# Patient Record
Sex: Female | Born: 1940 | Race: Black or African American | Hispanic: No | Smoking: Never smoker
Health system: Southern US, Community
[De-identification: ages and names within clinical notes are randomized; demographics above are authoritative.]

## PROBLEM LIST (undated history)

## (undated) DIAGNOSIS — E079 Disorder of thyroid, unspecified: Secondary | ICD-10-CM

## (undated) DIAGNOSIS — K219 Gastro-esophageal reflux disease without esophagitis: Secondary | ICD-10-CM

## (undated) DIAGNOSIS — E785 Hyperlipidemia, unspecified: Secondary | ICD-10-CM

## (undated) DIAGNOSIS — I639 Cerebral infarction, unspecified: Secondary | ICD-10-CM

## (undated) HISTORY — PX: CHOLECYSTECTOMY: SHX55

## (undated) HISTORY — DX: Cerebral infarction, unspecified: I63.9

## (undated) HISTORY — PX: ABDOMINAL HYSTERECTOMY: SHX81

## (undated) HISTORY — PX: LUMBAR LAMINECTOMY/DECOMPRESSION MICRODISCECTOMY: SHX5026

## (undated) HISTORY — PX: REPLACEMENT TOTAL KNEE: SUR1224

---

## 2008-04-11 DIAGNOSIS — M19079 Primary osteoarthritis, unspecified ankle and foot: Secondary | ICD-10-CM | POA: Insufficient documentation

## 2013-08-08 ENCOUNTER — Other Ambulatory Visit: Payer: Self-pay | Admitting: Internal Medicine

## 2013-08-08 DIAGNOSIS — M1990 Unspecified osteoarthritis, unspecified site: Secondary | ICD-10-CM

## 2013-08-19 ENCOUNTER — Ambulatory Visit
Admission: RE | Admit: 2013-08-19 | Discharge: 2013-08-19 | Disposition: A | Discharge status: Home / Self Care | Payer: No Typology Code available for payment source | Source: Ambulatory Visit | Attending: Unknown Physician Specialty | Admitting: Unknown Physician Specialty

## 2013-08-19 DIAGNOSIS — M1990 Unspecified osteoarthritis, unspecified site: Secondary | ICD-10-CM

## 2013-12-08 ENCOUNTER — Other Ambulatory Visit: Payer: Self-pay | Admitting: Internal Medicine

## 2013-12-08 DIAGNOSIS — M170 Bilateral primary osteoarthritis of knee: Secondary | ICD-10-CM

## 2013-12-20 ENCOUNTER — Other Ambulatory Visit: Payer: Self-pay

## 2013-12-21 ENCOUNTER — Ambulatory Visit
Admission: RE | Admit: 2013-12-21 | Discharge: 2013-12-21 | Disposition: A | Discharge status: Home / Self Care | Payer: No Typology Code available for payment source | Source: Ambulatory Visit | Attending: Unknown Physician Specialty | Admitting: Unknown Physician Specialty

## 2013-12-21 DIAGNOSIS — M170 Bilateral primary osteoarthritis of knee: Secondary | ICD-10-CM

## 2014-04-19 ENCOUNTER — Other Ambulatory Visit: Payer: Self-pay | Admitting: Internal Medicine

## 2014-04-19 ENCOUNTER — Other Ambulatory Visit: Payer: Self-pay | Admitting: Unknown Physician Specialty

## 2014-04-19 DIAGNOSIS — M170 Bilateral primary osteoarthritis of knee: Secondary | ICD-10-CM

## 2014-04-20 ENCOUNTER — Ambulatory Visit
Admission: RE | Admit: 2014-04-20 | Discharge: 2014-04-20 | Disposition: A | Discharge status: Home / Self Care | Payer: No Typology Code available for payment source | Source: Ambulatory Visit | Attending: Unknown Physician Specialty | Admitting: Unknown Physician Specialty

## 2014-04-20 DIAGNOSIS — M170 Bilateral primary osteoarthritis of knee: Secondary | ICD-10-CM

## 2014-07-06 DIAGNOSIS — M11262 Other chondrocalcinosis, left knee: Secondary | ICD-10-CM | POA: Insufficient documentation

## 2014-10-20 DIAGNOSIS — M1120 Other chondrocalcinosis, unspecified site: Secondary | ICD-10-CM | POA: Insufficient documentation

## 2015-04-04 DIAGNOSIS — M2011 Hallux valgus (acquired), right foot: Secondary | ICD-10-CM | POA: Insufficient documentation

## 2015-04-04 DIAGNOSIS — E039 Hypothyroidism, unspecified: Secondary | ICD-10-CM | POA: Insufficient documentation

## 2015-04-04 DIAGNOSIS — M21611 Bunion of right foot: Secondary | ICD-10-CM | POA: Insufficient documentation

## 2015-05-15 DIAGNOSIS — H903 Sensorineural hearing loss, bilateral: Secondary | ICD-10-CM | POA: Insufficient documentation

## 2016-08-02 ENCOUNTER — Emergency Department (HOSPITAL_BASED_OUTPATIENT_CLINIC_OR_DEPARTMENT_OTHER)
Admission: EM | Admit: 2016-08-02 | Discharge: 2016-08-02 | Disposition: A | Discharge status: Home / Self Care | Payer: Medicare Other | Attending: Emergency Medicine | Admitting: Emergency Medicine

## 2016-08-02 ENCOUNTER — Encounter (HOSPITAL_BASED_OUTPATIENT_CLINIC_OR_DEPARTMENT_OTHER): Payer: Self-pay

## 2016-08-02 DIAGNOSIS — Z91040 Latex allergy status: Secondary | ICD-10-CM | POA: Insufficient documentation

## 2016-08-02 DIAGNOSIS — G8918 Other acute postprocedural pain: Secondary | ICD-10-CM | POA: Diagnosis not present

## 2016-08-02 DIAGNOSIS — M545 Low back pain: Secondary | ICD-10-CM | POA: Diagnosis present

## 2016-08-02 HISTORY — DX: Gastro-esophageal reflux disease without esophagitis: K21.9

## 2016-08-02 HISTORY — DX: Disorder of thyroid, unspecified: E07.9

## 2016-08-02 HISTORY — DX: Hyperlipidemia, unspecified: E78.5

## 2016-08-02 LAB — CBC
HCT: 37.3 % (ref 36.0–46.0)
Hemoglobin: 12.4 g/dL (ref 12.0–15.0)
MCH: 28.6 pg (ref 26.0–34.0)
MCHC: 33.2 g/dL (ref 30.0–36.0)
MCV: 86.1 fL (ref 78.0–100.0)
PLATELETS: 241 10*3/uL (ref 150–400)
RBC: 4.33 MIL/uL (ref 3.87–5.11)
RDW: 13.8 % (ref 11.5–15.5)
WBC: 8.9 10*3/uL (ref 4.0–10.5)

## 2016-08-02 LAB — BASIC METABOLIC PANEL
ANION GAP: 10 (ref 5–15)
BUN: 20 mg/dL (ref 6–20)
CALCIUM: 8.8 mg/dL — AB (ref 8.9–10.3)
CO2: 22 mmol/L (ref 22–32)
CREATININE: 0.88 mg/dL (ref 0.44–1.00)
Chloride: 107 mmol/L (ref 101–111)
GFR calc non Af Amer: >60 mL/min (ref >60)
Glucose, Bld: 109 mg/dL — ABNORMAL HIGH (ref 65–99)
Potassium: 3.7 mmol/L (ref 3.5–5.1)
Sodium: 139 mmol/L (ref 135–145)

## 2016-08-02 MED ORDER — SODIUM CHLORIDE 0.9 % IV BOLUS (SEPSIS): 1000.0000 mL | Freq: Once | INTRAVENOUS | Status: DC

## 2016-08-02 MED ORDER — HYDROMORPHONE HCL 2 MG PO TABS: 2.0000 mg | ORAL_TABLET | Freq: Four times a day (QID) | ORAL | 0 refills | Status: DC | PRN

## 2016-08-02 MED ORDER — HYDROMORPHONE HCL 1 MG/ML IJ SOLN
1.0000 mg | Freq: Once | INTRAMUSCULAR | Status: AC
  Administered 2016-08-02: 1 mg via INTRAVENOUS
  Filled 2016-08-02: qty 1

## 2016-08-02 MED ORDER — ONDANSETRON HCL 4 MG/2ML IJ SOLN
4.0000 mg | Freq: Once | INTRAMUSCULAR | Status: AC
  Administered 2016-08-02: 4 mg via INTRAVENOUS
  Filled 2016-08-02: qty 2

## 2016-08-02 MED ORDER — HYDROMORPHONE HCL 1 MG/ML IJ SOLN
0.5000 mg | Freq: Once | INTRAMUSCULAR | Status: AC
  Administered 2016-08-02: 0.5 mg via INTRAVENOUS
  Filled 2016-08-02: qty 1

## 2016-08-02 NOTE — Discharge Instructions (Signed)
Follow-up with your spine surgeon.  Call them if you have any worsening symptoms.  Return to [facility] for fever, weakness other worsening concerns

## 2016-08-02 NOTE — ED Notes (Addendum)
Pt helped into gown. Able to stand and pivot to lowered bed from wheelchair x1 max assist. Daughter at bedside.

## 2016-08-02 NOTE — ED Provider Notes (Signed)
[facility] Provider [facility]   CSN: [id number] Arrival date & time: 08/02/16  1027     History   Chief Complaint Chief Complaint  Patient presents with  . Back Pain    HPI Tiffany Byrd is a 76 y.o. female.  HPI Patient presents to the emergency room for evaluation of persistent pain in her lower back and left buttocks rating down her left leg. Patient has a history of sciatica. She had a spinal surgery performed on Thursday. Patient initially felt okay but then yesterday she started having increasing severe pain. She was taking her hydrocodone aspirin and muscle relaxants without relief. She called her doctor today because she was not able to get comfortable and she was instructed to come to a emergency room for evaluation.    Past Medical History:  Diagnosis Date  . GERD (gastroesophageal reflux disease)   . Hyperlipidemia   . Thyroid disease     There are no active problems to display for this patient.   Past Surgical History:  Procedure Laterality Date  . ABDOMINAL HYSTERECTOMY    . CHOLECYSTECTOMY    . LUMBAR LAMINECTOMY/DECOMPRESSION MICRODISCECTOMY    . REPLACEMENT TOTAL KNEE      OB History    No data available       Home Medications    Prior to Admission medications   Medication Sig Start Date End Date Taking? Authorizing Provider  cholecalciferol (VITAMIN D) 400 units TABS tablet Take 400 Units by mouth.   Yes [provider]  Cyclobenzaprine HCl (FLEXERIL PO) Take by mouth.   Yes [provider]  HYDROCODONE-ACETAMINOPHEN PO Take by mouth.   Yes [provider]  levothyroxine (SYNTHROID, LEVOTHROID) 50 MCG tablet Take 50 mcg by mouth daily before breakfast.   Yes [provider]  metoCLOPramide (REGLAN) 5 MG tablet Take 5 mg by mouth 4 (four) times daily.   Yes [provider]  Multiple Vitamin (MULTIVITAMIN) LIQD Take 5 mLs by mouth daily.   Yes [provider]  Omeprazole (PRILOSEC PO)  Take by mouth.   Yes [provider]  Ondansetron HCl (ZOFRAN PO) Take by mouth.   Yes [provider]  PRAVASTATIN SODIUM PO Take by mouth.   Yes [provider]  triamterene-hydrochlorothiazide (DYAZIDE) 37.5-25 MG capsule Take 1 capsule by mouth daily.   Yes [provider]  HYDROmorphone (DILAUDID) 2 MG tablet Take 1 tablet (2 mg total) by mouth every 6 (six) hours as needed. 08/02/16   Linwood DibblesKnapp, Nicolo Tomko, MD    Family History History reviewed. No pertinent family history.  Social History Social History  Substance Use Topics  . Smoking status: Never Smoker  . Smokeless tobacco: Never Used  . Alcohol use Not on file     Allergies   Latex and Penicillins   Review of Systems Review of Systems  All other systems reviewed and are negative.    Physical Exam Updated Vital Signs BP 129/80   Pulse 89   Temp 98.1 F (36.7 C) (Oral)   Resp 18   Ht 1.549 m (5\' 1" )   Wt 73 kg (161 lb)   SpO2 94%   BMI 30.42 kg/m   Physical Exam  Constitutional: She appears well-developed and well-nourished. No distress.  HENT:  Head: Normocephalic and atraumatic.  Right Ear: External ear normal.  Left Ear: External ear normal.  Eyes: Conjunctivae are normal. Right eye exhibits no discharge. Left eye exhibits no discharge. No scleral icterus.  Neck: Neck supple.  No tracheal deviation present.  Cardiovascular: Normal rate, regular rhythm and intact distal pulses.   Pulmonary/Chest: Effort normal and breath sounds normal. No stridor. No respiratory distress. She has no wheezes. She has no rales.  Abdominal: Soft. Bowel sounds are normal. She exhibits no distension. There is no tenderness. There is no rebound and no guarding.  Musculoskeletal: She exhibits no edema.       Lumbar back: She exhibits no swelling.  Surgical wound without erythema or drainage, no induration or swelling  Neurological: She is alert. She has normal strength. No cranial nerve deficit (no  facial droop, extraocular movements intact, no slurred speech) or sensory deficit. She exhibits normal muscle tone. She displays no seizure activity. Coordination normal.  Skin: Skin is warm and dry. No rash noted.  Psychiatric: She has a normal mood and affect.  Nursing note and vitals reviewed.    ED Treatments / Results  Labs (all labs ordered are listed, but only abnormal results are displayed) Labs Reviewed  BASIC METABOLIC PANEL - Abnormal; Notable for the following:       Result Value   Glucose, Bld 109 (*)    Calcium 8.8 (*)    All other components within normal limits  CBC    Procedures (including critical care time)  Medications Ordered in ED Medications  sodium chloride 0.9 % bolus 1,000 mL (not administered)  HYDROmorphone (DILAUDID) injection 1 mg (1 mg Intravenous Given 08/02/16 1128)  ondansetron (ZOFRAN) injection 4 mg (4 mg Intravenous Given 08/02/16 1128)  HYDROmorphone (DILAUDID) injection 0.5 mg (0.5 mg Intravenous Given 08/02/16 1214)     Initial Impression / Assessment and Plan / ED Course  I have reviewed the triage vital signs and the nursing notes.  Pertinent labs & imaging results that were available during my care of the patient were reviewed by me and considered in my medical decision making (see chart for details).  Clinical Course as of Aug 02 1253  Sat Aug 02, 2016  1153 Pain is improving.  Will give an additional dose  [JK]  1251 Pt feels like her pain has improved.  Feels ready for discharge  [JK]    Clinical Course User Index [JK] Linwood Dibbles, MD    Patient presents with back pain associated with recent spinal surgery. She has no signs of any fever to suggest acute infection. No acute neurologic deficits. Plan on discharge home with continued pain management. We discussed following up with her neurosurgeon or going to [facility] if she has any recurrent or worsening symptoms since that is where she had her surgery.  Final Clinical  Impressions(s) / ED Diagnoses   Final diagnoses:  Post-operative pain    New Prescriptions New Prescriptions   HYDROMORPHONE (DILAUDID) 2 MG TABLET    Take 1 tablet (2 mg total) by mouth every 6 (six) hours as needed.     Linwood Dibbles, MD 08/02/16 1256

## 2016-08-02 NOTE — ED Triage Notes (Addendum)
Pt reports lumbar surgery Thursday - continues to have left buttocks pain - despite taking hydrocodone, ASA and muscle relaxants. Pain radiates down left left. Denies injury or falls. Denies bowel incontinence but has baseline bladder incontinence.

## 2016-11-19 DIAGNOSIS — Z9889 Other specified postprocedural states: Secondary | ICD-10-CM | POA: Insufficient documentation

## 2017-11-20 ENCOUNTER — Encounter (HOSPITAL_BASED_OUTPATIENT_CLINIC_OR_DEPARTMENT_OTHER): Payer: Self-pay | Admitting: Emergency Medicine

## 2017-11-20 ENCOUNTER — Emergency Department (HOSPITAL_BASED_OUTPATIENT_CLINIC_OR_DEPARTMENT_OTHER): Payer: Medicare Other

## 2017-11-20 ENCOUNTER — Other Ambulatory Visit: Payer: Self-pay

## 2017-11-20 ENCOUNTER — Emergency Department (HOSPITAL_BASED_OUTPATIENT_CLINIC_OR_DEPARTMENT_OTHER)
Admission: EM | Admit: 2017-11-20 | Discharge: 2017-11-20 | Disposition: A | Discharge status: Home / Self Care | Payer: Medicare Other | Attending: Emergency Medicine | Admitting: Emergency Medicine

## 2017-11-20 DIAGNOSIS — Y999 Unspecified external cause status: Secondary | ICD-10-CM | POA: Insufficient documentation

## 2017-11-20 DIAGNOSIS — Z23 Encounter for immunization: Secondary | ICD-10-CM | POA: Diagnosis not present

## 2017-11-20 DIAGNOSIS — S0990XA Unspecified injury of head, initial encounter: Secondary | ICD-10-CM | POA: Diagnosis present

## 2017-11-20 DIAGNOSIS — Z96659 Presence of unspecified artificial knee joint: Secondary | ICD-10-CM | POA: Insufficient documentation

## 2017-11-20 DIAGNOSIS — Z79899 Other long term (current) drug therapy: Secondary | ICD-10-CM | POA: Insufficient documentation

## 2017-11-20 DIAGNOSIS — Y92018 Other place in single-family (private) house as the place of occurrence of the external cause: Secondary | ICD-10-CM | POA: Diagnosis not present

## 2017-11-20 DIAGNOSIS — Y9301 Activity, walking, marching and hiking: Secondary | ICD-10-CM | POA: Insufficient documentation

## 2017-11-20 DIAGNOSIS — S0101XA Laceration without foreign body of scalp, initial encounter: Secondary | ICD-10-CM | POA: Diagnosis not present

## 2017-11-20 DIAGNOSIS — W01190A Fall on same level from slipping, tripping and stumbling with subsequent striking against furniture, initial encounter: Secondary | ICD-10-CM | POA: Diagnosis not present

## 2017-11-20 MED ORDER — TETANUS-DIPHTH-ACELL PERTUSSIS 5-2.5-18.5 LF-MCG/0.5 IM SUSP
0.5000 mL | Freq: Once | INTRAMUSCULAR | Status: AC
  Administered 2017-11-20: 0.5 mL via INTRAMUSCULAR
  Filled 2017-11-20: qty 0.5

## 2017-11-20 MED ORDER — LIDOCAINE-EPINEPHRINE (PF) 1 %-1:200000 IJ SOLN
10.0000 mL | Freq: Once | INTRAMUSCULAR | Status: AC
  Administered 2017-11-20: 10 mL
  Filled 2017-11-20: qty 10

## 2017-11-20 NOTE — ED Triage Notes (Signed)
Pt tripped over her shoe, fell and hit the back of her head.  No LOC.  Denies blood thinners.

## 2017-11-20 NOTE — ED Provider Notes (Signed)
[facility] EMERGENCY DEPARTMENT Provider Note   CSN: [id number] Arrival date & time: 11/20/17  9811     History   Chief Complaint Chief Complaint  Patient presents with  . Head Injury    HPI Traniya Prichett is a 77 y.o. female.  Patient is a 77 year old female with a history of GERD, hyperlipidemia and thyroid disease presenting today after having a mechanical fall around 3:00 in the morning when she got up to go to the bathroom.  She tripped on her house shoe causing her to fall backwards in the back of her head hit the corner of an end table.  She then slid to the floor.  She denies loss of consciousness but did cut the back of her head.  When she did her husband were looking this morning they realized the cut was more extensive than they thought and came for further evaluation.  She does not take anticoagulation.  She is otherwise feeling normal.  The history is provided by the patient.  Head Injury   The incident occurred 3 to 5 hours ago. She came to the ER via walk-in. The injury mechanism was a fall. There was no loss of consciousness. The volume of blood lost was minimal. The quality of the pain is described as dull. The pain is at a severity of 1/10. The pain is mild. The pain has been constant since the injury. Pertinent negatives include no numbness, no blurred vision, no vomiting, no tinnitus, no disorientation and no weakness. Associated symptoms comments: No neck pain.  No difficulty walking.  Unknown last tetanus. She has tried nothing for the symptoms. The treatment provided no relief.    Past Medical History:  Diagnosis Date  . GERD (gastroesophageal reflux disease)   . Hyperlipidemia   . Thyroid disease     There are no active problems to display for this patient.   Past Surgical History:  Procedure Laterality Date  . ABDOMINAL HYSTERECTOMY    . CHOLECYSTECTOMY    . LUMBAR LAMINECTOMY/DECOMPRESSION MICRODISCECTOMY    . REPLACEMENT TOTAL KNEE        OB History   None      Home Medications    Prior to Admission medications   Medication Sig Start Date End Date Taking? Authorizing Provider  cholecalciferol (VITAMIN D) 400 units TABS tablet Take 400 Units by mouth.    [provider]  Cyclobenzaprine HCl (FLEXERIL PO) Take by mouth.    [provider]  levothyroxine (SYNTHROID, LEVOTHROID) 50 MCG tablet Take 50 mcg by mouth daily before breakfast.    [provider]  metoCLOPramide (REGLAN) 5 MG tablet Take 5 mg by mouth 4 (four) times daily.    [provider]  Multiple Vitamin (MULTIVITAMIN) LIQD Take 5 mLs by mouth daily.    [provider]  Omeprazole (PRILOSEC PO) Take by mouth.    [provider]  Ondansetron HCl (ZOFRAN PO) Take by mouth.    [provider]  PRAVASTATIN SODIUM PO Take by mouth.    [provider]  triamterene-hydrochlorothiazide (DYAZIDE) 37.5-25 MG capsule Take 1 capsule by mouth daily.    [provider]    Family History No family history on file.  Social History Social History   Tobacco Use  . Smoking status: Never Smoker  . Smokeless tobacco: Never Used  Substance Use Topics  . Alcohol use: Not on file  . Drug use: Not on file     Allergies   Latex;  Penicillins; and Hydrocodone-acetaminophen   Review of Systems Review of Systems  HENT: Negative for tinnitus.   Eyes: Negative for blurred vision.  Gastrointestinal: Negative for vomiting.  Neurological: Negative for weakness and numbness.  All other systems reviewed and are negative.    Physical Exam Updated Vital Signs BP (!) 143/82   Pulse 77   Temp 98.3 F (36.8 C) (Oral)   Ht 5' (1.524 m)   Wt 72.6 kg   SpO2 99%   BMI 31.25 kg/m   Physical Exam  Constitutional: She is oriented to person, place, and time. She appears well-developed and well-nourished. No distress.  HENT:  Head: Normocephalic. Head is with laceration.    Mouth/Throat:  Oropharynx is clear and moist.  Eyes: Pupils are equal, round, and reactive to light. Conjunctivae and EOM are normal.  Neck: Normal range of motion. Neck supple. No spinous process tenderness and no muscular tenderness present. Normal range of motion present.    Cardiovascular: Normal rate, regular rhythm and intact distal pulses.  No murmur heard. Pulmonary/Chest: Effort normal and breath sounds normal. No respiratory distress. She has no wheezes. She has no rales.  Musculoskeletal: Normal range of motion. She exhibits no edema or tenderness.  Neurological: She is alert and oriented to person, place, and time. She has normal strength. No sensory deficit. Coordination and gait normal.  Skin: Skin is warm and dry. No rash noted. No erythema.  Psychiatric: She has a normal mood and affect. Her behavior is normal.  Nursing note and vitals reviewed.    ED Treatments / Results  Labs (all labs ordered are listed, but only abnormal results are displayed) Labs Reviewed - No data to display  EKG None  Radiology Ct Head Wo Contrast  Result Date: 11/20/2017 CLINICAL DATA:  Pain following fall EXAM: CT HEAD WITHOUT CONTRAST TECHNIQUE: Contiguous axial images were obtained from the base of the skull through the vertex without intravenous contrast. COMPARISON:  None. FINDINGS: Brain: There is mild to moderate diffuse atrophy. There is no intracranial mass, hemorrhage, extra-axial fluid collection, or midline shift. There is mild patchy small vessel disease in the centra semiovale bilaterally. Brain parenchyma elsewhere appears normal. No evident acute infarct. Vascular: No hyperdense vessel. There is calcification in each carotid siphon region. Skull: Bony calvarium appears intact. Sinuses/Orbits: There is mucosal thickening in several ethmoid air cells bilaterally. Other visualized paranasal sinuses are clear. Orbits appear symmetric bilaterally. There is evidence of cataract removals bilaterally.  Other: Bony calvarium pairs intact. IMPRESSION: Atrophy with mild patchy periventricular small vessel disease. No evident acute infarct. No mass or hemorrhage. There are foci of carotid artery calcification. There is mucosal thickening in several ethmoid air cells. Electronically Signed   By: Bretta Bang III M.D.   On: 11/20/2017 10:38    Procedures Procedures (including critical care time) LACERATION REPAIR Performed by: [facility] Authorized by: Gwyneth Sprout Consent: Verbal consent obtained. Risks and benefits: risks, benefits and alternatives were discussed Consent given by: patient Patient identity confirmed: provided demographic data Prepped and Draped in normal sterile fashion Wound explored  Laceration Location: scalp  Laceration Length: 3cm  No Foreign Bodies seen or palpated  Anesthesia: local infiltration  Local anesthetic: lidocaine 1% with epinephrine  Anesthetic total: 4 ml  Irrigation method: syringe Amount of cleaning: standard  Skin closure: staples  Number of sutures: 6   Patient tolerance: Patient tolerated the procedure well with no immediate complications.   Medications Ordered in ED Medications  Tdap (BOOSTRIX) injection 0.5  mL (has no administration in time range)     Initial Impression / Assessment and Plan / ED Course  I have reviewed the triage vital signs and the nursing notes.  Pertinent labs & imaging results that were available during my care of the patient were reviewed by me and considered in my medical decision making (see chart for details).     Patient with a mechanical fall early this morning with results of laceration to the back of her head.  She is neurovascularly intact at this time.  She had no LOC and does not take anticoagulation.  Tetanus shot was updated.  Wound repair as above.  Head CT neg.  Final Clinical Impressions(s) / ED Diagnoses   Final diagnoses:  Laceration of scalp, initial encounter     ED Discharge Orders    None       Gwyneth Sprout, MD 11/20/17 1120

## 2017-11-27 ENCOUNTER — Encounter (HOSPITAL_BASED_OUTPATIENT_CLINIC_OR_DEPARTMENT_OTHER): Payer: Self-pay | Admitting: Emergency Medicine

## 2017-11-27 ENCOUNTER — Emergency Department (HOSPITAL_BASED_OUTPATIENT_CLINIC_OR_DEPARTMENT_OTHER)
Admission: EM | Admit: 2017-11-27 | Discharge: 2017-11-27 | Disposition: A | Discharge status: Home / Self Care | Payer: Medicare Other | Attending: Emergency Medicine | Admitting: Emergency Medicine

## 2017-11-27 ENCOUNTER — Other Ambulatory Visit: Payer: Self-pay

## 2017-11-27 DIAGNOSIS — Z7902 Long term (current) use of antithrombotics/antiplatelets: Secondary | ICD-10-CM | POA: Insufficient documentation

## 2017-11-27 DIAGNOSIS — Z4802 Encounter for removal of sutures: Secondary | ICD-10-CM

## 2017-11-27 DIAGNOSIS — E079 Disorder of thyroid, unspecified: Secondary | ICD-10-CM | POA: Insufficient documentation

## 2017-11-27 DIAGNOSIS — X58XXXD Exposure to other specified factors, subsequent encounter: Secondary | ICD-10-CM | POA: Insufficient documentation

## 2017-11-27 DIAGNOSIS — Z91040 Latex allergy status: Secondary | ICD-10-CM | POA: Diagnosis not present

## 2017-11-27 DIAGNOSIS — S0181XD Laceration without foreign body of other part of head, subsequent encounter: Secondary | ICD-10-CM | POA: Diagnosis not present

## 2017-11-27 DIAGNOSIS — Z79899 Other long term (current) drug therapy: Secondary | ICD-10-CM | POA: Insufficient documentation

## 2017-11-27 DIAGNOSIS — Z96659 Presence of unspecified artificial knee joint: Secondary | ICD-10-CM | POA: Diagnosis not present

## 2017-11-27 NOTE — ED Provider Notes (Signed)
[facility] EMERGENCY DEPARTMENT Provider Note   CSN: [id number] Arrival date & time: 11/27/17  0802     History   Chief Complaint Chief Complaint  Patient presents with  . Suture / Staple Removal    HPI Tiffany Byrd is a 77 y.o. female.  HPI   Presents for staple removal. Injury 7 days ago. No concerns today.  Past Medical History:  Diagnosis Date  . GERD (gastroesophageal reflux disease)   . Hyperlipidemia   . Thyroid disease     There are no active problems to display for this patient.   Past Surgical History:  Procedure Laterality Date  . ABDOMINAL HYSTERECTOMY    . CHOLECYSTECTOMY    . LUMBAR LAMINECTOMY/DECOMPRESSION MICRODISCECTOMY    . REPLACEMENT TOTAL KNEE       OB History   None      Home Medications    Prior to Admission medications   Medication Sig Start Date End Date Taking? Authorizing Provider  cholecalciferol (VITAMIN D) 400 units TABS tablet Take 400 Units by mouth.    [provider]  Cyclobenzaprine HCl (FLEXERIL PO) Take by mouth.    [provider]  levothyroxine (SYNTHROID, LEVOTHROID) 50 MCG tablet Take 50 mcg by mouth daily before breakfast.    [provider]  metoCLOPramide (REGLAN) 5 MG tablet Take 5 mg by mouth 4 (four) times daily.    [provider]  Multiple Vitamin (MULTIVITAMIN) LIQD Take 5 mLs by mouth daily.    [provider]  Omeprazole (PRILOSEC PO) Take by mouth.    [provider]  Ondansetron HCl (ZOFRAN PO) Take by mouth.    [provider]  PRAVASTATIN SODIUM PO Take by mouth.    [provider]  triamterene-hydrochlorothiazide (DYAZIDE) 37.5-25 MG capsule Take 1 capsule by mouth daily.    [provider]    Family History No family history on file.  Social History Social History   Tobacco Use  . Smoking status: Never Smoker  . Smokeless tobacco: Never Used  Substance Use Topics  . Alcohol use: Not on file  .  Drug use: Not on file     Allergies   Latex; Penicillins; and Hydrocodone-acetaminophen   Review of Systems Review of Systems  Skin: Positive for wound.     Physical Exam Updated Vital Signs BP (!) 146/79 (BP Location: Right Arm)   Pulse 70   Temp 97.8 F (36.6 C)   Resp 14   SpO2 97%   Physical Exam  Constitutional: She is oriented to person, place, and time. She appears well-developed and well-nourished. No distress.  HENT:  Head: Normocephalic and atraumatic.  Eyes: Conjunctivae and EOM are normal.  Neck: Normal range of motion.  Cardiovascular: Normal rate.  Pulmonary/Chest: Effort normal and breath sounds normal. No respiratory distress.  Neurological: She is alert and oriented to person, place, and time.  Skin: Skin is warm and dry. No rash noted. She is not diaphoretic. No erythema.  Healing laceration, 6 staplesin place posterior scalp  Nursing note and vitals reviewed.    ED Treatments / Results  Labs (all labs ordered are listed, but only abnormal results are displayed) Labs Reviewed - No data to display  EKG None  Radiology No results found.  Procedures .Suture Removal Date/Time: 11/27/2017 9:40 PM Performed by: Alvira Monday, MD Authorized by: Alvira Monday, MD   Consent:    Consent obtained:  Verbal   Consent given by:  Patient   Risks discussed:  Bleeding, pain and wound separation Location:    Location:  Head/neck   Head/neck location:  Scalp Procedure details:    Wound appearance:  No signs of infection, good wound healing and clean   Number of staples removed:  6 Post-procedure details:    Patient tolerance of procedure:  Tolerated well, no immediate complications   (including critical care time)  Medications Ordered in ED Medications - No data to display   Initial Impression / Assessment and Plan / ED Course  I have reviewed the triage vital signs and the nursing notes.  Pertinent labs & imaging results that were  available during my care of the patient were reviewed by me and considered in my medical decision making (see chart for details).    Presents with desire for staple removal. Wound with appropriate healing, no sign of infection. No other concerns today. 6 staples removed as above.   Final Clinical Impressions(s) / ED Diagnoses   Final diagnoses:  Removal of staples    ED Discharge Orders    None       Alvira Monday, MD 11/27/17 2141

## 2017-11-27 NOTE — ED Triage Notes (Signed)
PT presents wanting staples removed from back of head

## 2018-03-17 DIAGNOSIS — G44219 Episodic tension-type headache, not intractable: Secondary | ICD-10-CM | POA: Insufficient documentation

## 2018-10-06 DIAGNOSIS — M7062 Trochanteric bursitis, left hip: Secondary | ICD-10-CM | POA: Insufficient documentation

## 2019-06-22 DIAGNOSIS — N289 Disorder of kidney and ureter, unspecified: Secondary | ICD-10-CM | POA: Insufficient documentation

## 2019-08-04 DIAGNOSIS — H43813 Vitreous degeneration, bilateral: Secondary | ICD-10-CM | POA: Insufficient documentation

## 2020-03-05 ENCOUNTER — Ambulatory Visit: Payer: Self-pay

## 2020-03-05 ENCOUNTER — Ambulatory Visit (HOSPITAL_BASED_OUTPATIENT_CLINIC_OR_DEPARTMENT_OTHER)
Admission: RE | Admit: 2020-03-05 | Discharge: 2020-03-05 | Disposition: A | Discharge status: Home / Self Care | Payer: Medicare Other | Source: Ambulatory Visit | Attending: Family Medicine | Admitting: Family Medicine

## 2020-03-05 ENCOUNTER — Other Ambulatory Visit: Payer: Self-pay

## 2020-03-05 ENCOUNTER — Ambulatory Visit: Payer: Medicare Other | Admitting: Family Medicine

## 2020-03-05 VITALS — BP 148/80 | Ht 61.0 in | Wt 160.0 lb

## 2020-03-05 DIAGNOSIS — M1611 Unilateral primary osteoarthritis, right hip: Secondary | ICD-10-CM | POA: Diagnosis not present

## 2020-03-05 DIAGNOSIS — M1712 Unilateral primary osteoarthritis, left knee: Secondary | ICD-10-CM | POA: Diagnosis not present

## 2020-03-05 DIAGNOSIS — Z96651 Presence of right artificial knee joint: Secondary | ICD-10-CM | POA: Insufficient documentation

## 2020-03-05 DIAGNOSIS — M25551 Pain in right hip: Secondary | ICD-10-CM

## 2020-03-05 DIAGNOSIS — G8929 Other chronic pain: Secondary | ICD-10-CM

## 2020-03-05 MED ORDER — CELECOXIB 200 MG PO CAPS: ORAL_CAPSULE | ORAL | 2 refills | Status: DC

## 2020-03-05 NOTE — Assessment & Plan Note (Signed)
She reports she was told that she needs replacement.  Has limitations in her range of motion.  Has significant weakness with hip abduction.  Has been provided to hip joint injections.  Limited improvement with the second injection. -Counseled on home exercise therapy and supportive care. -X-ray. -Referral to physical therapy. -Could consider injection.

## 2020-03-05 NOTE — Assessment & Plan Note (Signed)
Mild effusion on exam.  Has had previous steroid injections in the past.  Likely exacerbated with taking pressure off of the right leg. -Counseled on home exercise therapy and supportive care. -Celebrex. -Referral to physical therapy. -Could consider imaging or injection.

## 2020-03-05 NOTE — Patient Instructions (Signed)
Nice to meet you Please try ice as needed  Please try physical therapy  Please try the celebrex   Please send me a message in MyChart with any questions or updates.  Please see me back in 4-6 weeks.   --Dr. Jordan Likes

## 2020-03-05 NOTE — Progress Notes (Signed)
Tiffany Byrd - 80 y.o. female MRN [id number]  Date of birth: 08/17/1940  SUBJECTIVE:  Including CC & ROS.  No chief complaint on file.   Tiffany Byrd is a 80 y.o. female that is presenting with acute on chronic right knee pain and right hip pain.  She has a history of arthroplasty of the right knee.  Started having pain about a year ago.  She subsequently has developed severe hip pain.  She has been taking Tylenol with limited improvement.  She has not done physical therapy since 6 weeks after her knee replacement surgery.  Denies any falls or injuries.   Review of Systems See HPI   HISTORY: Past Medical, Surgical, Social, and Family History Reviewed & Updated per EMR.   Pertinent Historical Findings include:  Past Medical History:  Diagnosis Date  . GERD (gastroesophageal reflux disease)   . Hyperlipidemia   . Thyroid disease     Past Surgical History:  Procedure Laterality Date  . ABDOMINAL HYSTERECTOMY    . CHOLECYSTECTOMY    . LUMBAR LAMINECTOMY/DECOMPRESSION MICRODISCECTOMY    . REPLACEMENT TOTAL KNEE      No family history on file.  Social History   Socioeconomic History  . Marital status: Married    Spouse name: Not on file  . Number of children: Not on file  . Years of education: Not on file  . Highest education level: Not on file  Occupational History  . Not on file  Tobacco Use  . Smoking status: Never Smoker  . Smokeless tobacco: Never Used  Substance and Sexual Activity  . Alcohol use: Not on file  . Drug use: Not on file  . Sexual activity: Not on file  Other Topics Concern  . Not on file  Social History Narrative  . Not on file   Social Determinants of Health   Financial Resource Strain: Not on file  Food Insecurity: Not on file  Transportation Needs: Not on file  Physical Activity: Not on file  Stress: Not on file  Social Connections: Not on file  Intimate Partner Violence: Not on file     PHYSICAL EXAM:  VS: BP (!) 148/80   Ht 5\' 1"   (1.549 m)   Wt 160 lb (72.6 kg)   BMI 30.23 kg/m  Physical Exam Gen: NAD, alert, cooperative with exam, well-appearing MSK:  Right hand: Limited internal and external rotation. Limited hip flexion. Tenderness to palpation of the greater trochanter. Neurovascularly intact  Limited ultrasound: Right knee:  Trace effusion of the suprapatellar pouch. Mild spurring at the insertion of the quadriceps into the superior pole of patella. Normal-appearing patellar tendon. Increased hyperemia over the lateral femoral condyle  Summary: Concern that the lateral femoral condyle has stress changes with increased hyperemia.  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  Concern over the lateral aspect with increased hyperemia of the tear may be bony stress changes.  X-rays have been reported as normal to this point. -Counseled on home exercise therapy and supportive care. -Referral to physical therapy. -Could consider further imaging.  Primary osteoarthritis of right hip She reports she was told that she needs replacement.  Has limitations in her range of motion.  Has significant weakness with hip abduction.  Has been provided to hip joint injections.  Limited improvement with the second injection. -Counseled on home exercise therapy and supportive care. -X-ray. -Referral to physical  therapy. -Could consider injection.   Primary osteoarthritis of left knee Mild effusion on exam.  Has had previous steroid injections in the past.  Likely exacerbated with taking pressure off of the right leg. -Counseled on home exercise therapy and supportive care. -Celebrex. -Referral to physical therapy. -Could consider imaging or injection.

## 2020-03-05 NOTE — Assessment & Plan Note (Signed)
Acute on chronic in nature.  Concern over the lateral aspect with increased hyperemia of the tear may be bony stress changes.  X-rays have been reported as normal to this point. -Counseled on home exercise therapy and supportive care. -Referral to physical therapy. -Could consider further imaging.

## 2020-03-06 ENCOUNTER — Telehealth: Payer: Self-pay | Admitting: Family Medicine

## 2020-03-06 NOTE — Telephone Encounter (Signed)
Informed of results.   Myra Rude, MD [facility] 03/06/2020, 10:08 AM

## 2020-03-19 ENCOUNTER — Ambulatory Visit: Payer: Medicare Other | Attending: Family Medicine | Admitting: Physical Therapy

## 2020-03-19 ENCOUNTER — Other Ambulatory Visit: Payer: Self-pay

## 2020-03-19 DIAGNOSIS — M5442 Lumbago with sciatica, left side: Secondary | ICD-10-CM | POA: Diagnosis present

## 2020-03-19 DIAGNOSIS — G8929 Other chronic pain: Secondary | ICD-10-CM

## 2020-03-19 DIAGNOSIS — M6281 Muscle weakness (generalized): Secondary | ICD-10-CM | POA: Insufficient documentation

## 2020-03-19 DIAGNOSIS — M25561 Pain in right knee: Secondary | ICD-10-CM | POA: Diagnosis present

## 2020-03-19 DIAGNOSIS — R262 Difficulty in walking, not elsewhere classified: Secondary | ICD-10-CM | POA: Insufficient documentation

## 2020-03-19 DIAGNOSIS — M25551 Pain in right hip: Secondary | ICD-10-CM | POA: Diagnosis present

## 2020-03-19 NOTE — Therapy (Signed)
[facility] [place] [address]  [address] [place], [place], [address] Phone: [phone]   Fax:  [phone]  Physical Therapy Evaluation  Patient Details  Name: Tiffany LevelSylvia Byrd MRN: [id number] Date of Birth: [birth year] Referring Provider (PT): Myra RudeJeremy E Schmitz, MD   Encounter Date: 03/19/2020   PT End of Session - 03/19/20 0848    Visit Number 1    Number of Visits 16    Date for PT Re-Evaluation 05/14/20    Authorization Type UHC Medicare    PT Start Time 0848    PT Stop Time 0935    PT Time Calculation (min) 47 min    Activity Tolerance Patient tolerated treatment well;Patient limited by pain    Behavior [facility] for tasks assessed/performed           Past Medical History:  Diagnosis Date  . GERD (gastroesophageal reflux disease)   . Hyperlipidemia   . Thyroid disease     Past Surgical History:  Procedure Laterality Date  . ABDOMINAL HYSTERECTOMY    . CHOLECYSTECTOMY    . LUMBAR LAMINECTOMY/DECOMPRESSION MICRODISCECTOMY    . REPLACEMENT TOTAL KNEE      There were no vitals filed for this visit.    Subjective Assessment - 03/19/20 0852    Subjective Pt reports she has been having problems with pain in her R hip and knee in the latter half of 2021. Has had 2 injections in her hip with minimal to no relief. Had her back checked out w/o any problems identified on R and was told she would need likely need a R THR but does have L sided sciatica. The pain that brings her to PT is mostly in R lateral hip and groin  as well as lateral R knee which prevents her from sleeping on her R side. Initially used cane but thinks she was using on the wrong side and thinks this made her pain worse - now using a rollator. Has not driven since June due to pain.    Pertinent History R TKA 2018, scoliosis    Limitations Sitting;Standing;Walking;House hold activities;Lifting    How long can you sit comfortably? has to constantly  change positions    How long can you stand comfortably? 20 minutes    How long can you walk comfortably? never w/o pain, but can usually walk up to 30 minutes    Patient Stated Goals "move w/o an aide (rollator or cane)"    Currently in Pain? Yes    Pain Score 6     Pain Location Knee    Pain Orientation Right;Lateral    Pain Descriptors / Indicators Dull;Aching    Pain Type Acute pain    Pain Radiating Towards occasionally down R shin    Pain Onset More than a month ago   Fall 2021   Pain Frequency Constant    Aggravating Factors  sleeping on R side, housework, walking w/o AD    Pain Relieving Factors Celebrex takes the edeg off, Tylenol, minimal relief from ice/heat    Effect of Pain on Daily Activities interferes with sleep, diffculty with household chores, unable to drive    Pain Score 7    Pain Location Hip    Pain Orientation Right;Posterior;Lateral;Anterior    Pain Descriptors / Indicators Dull    Pain Type Acute pain    Pain Onset More than a month ago   Fall 2021   Pain Frequency Constant    Aggravating  Factors  sleeping on R side, housework, walking w/o AD    Pain Relieving Factors Celebrex takes the edeg off, Tylenol, minimal relief from ice/heat    Effect of Pain on Daily Activities interferes with sleep, diffculty with household chores, unable to drive    Pain Score 8   7-6/73   Pain Location Back    Pain Orientation Lower    Pain Descriptors / Indicators Other (Comment)   "just a hurt"   Pain Type Chronic pain    Pain Radiating Towards sciatica L LE to foot    Pain Onset Other (comment)   2019 following TKA   Pain Frequency Intermittent    Aggravating Factors  prolonged walking or moving a lot    Pain Relieving Factors nothing              [facility] PT Assessment - 03/19/20 0848      Assessment   Medical Diagnosis R hip & knee pain; LBP with L sciatica    Referring Provider (PT) Myra Rude, MD    Onset Date/Surgical Date --   Fall 2021   Hand Dominance  Right    Next MD Visit 04/05/20    Prior Therapy PT after R TKA in 2018/2019      Precautions   Precautions None      Restrictions   Weight Bearing Restrictions No      Balance Screen   Has the patient fallen in the past 6 months No    Has the patient had a decrease in activity Byrd because of a fear of falling?  Yes    Is the patient reluctant to leave their home because of a fear of falling?  Yes      Home Environment   Living Environment Private residence    Living Arrangements Spouse/significant other    Type of Home House    Home Access Byrd entry    Home Layout One Byrd    Home Equipment Walker - 4 wheels;Walker - 2 wheels;Cane - single point;Bedside commode;Grab bars - tub/shower      Prior Function   Byrd of Independence Independent    Vocation Retired    Leisure working in the yard - planting; walking; Investment banker, operational - has not been able to do any of this due to pain      Cognition   Overall Cognitive Status Within Functional Limits for tasks assessed      ROM / Strength   AROM / PROM / Strength Strength      Strength   Overall Strength Comments tested in sitting    Strength Assessment Site Hip;Knee;Ankle    Right/Left Hip Right;Left    Right Hip Flexion 3+/5    Right Hip Extension 3+/5    Right Hip External Rotation  3+/5;3/5    Right Hip Internal Rotation 3/5    Right Hip ABduction 4-/5    Right Hip ADduction 4-/5    Left Hip Flexion 4-/5    Left Hip Extension 4-/5    Left Hip External Rotation 3+/5    Left Hip Internal Rotation 3+/5    Left Hip ABduction 4-/5    Left Hip ADduction 4-/5    Right/Left Knee Right;Left    Right Knee Flexion 3+/5    Right Knee Extension 4/5    Left Knee Flexion 3+/5    Left Knee Extension 4-/5    Right/Left Ankle Right;Left    Right Ankle Dorsiflexion 4-/5    Left Ankle  Dorsiflexion 4-/5      Palpation   Palpation comment ttp over R ITB, GT, hip flexors; B glutes & piriformis                       Objective measurements completed on examination: See above findings.                 PT Short Term Goals - 03/19/20 0935      PT SHORT TERM GOAL #1   Title Patient will be independent with initial HEP    Status New    Target Date 04/09/20             PT Long Term Goals - 03/19/20 0935      PT LONG TERM GOAL #1   Title Patient will be independent with ongoing/advanced HEP for self-management at home    Status New    Target Date 05/14/20      PT LONG TERM GOAL #2   Title Decrease R hip and knee pain by >/= 50-75% allowing patient increased ease of mobility and decreased dependence on AD    Status New    Target Date 05/14/20      PT LONG TERM GOAL #3   Title Patient will demonstrate improved B LE strength to >/= 4 to 4+/5 for improved stability and ease of mobility    Status New    Target Date 05/14/20      PT LONG TERM GOAL #4   Title Patient will ambulate with normal gait pattern with or w/o LRAD    Status New    Target Date 05/14/20                  Plan - 03/19/20 0935    Clinical Impression Statement [place] is an 80 y/o female who presents to OP PT for chronic R hip and knee pain originating mid-2021. She has not responded to injections and has been told that she will likely need a R THA but would like to try PT first. She has had a R TKA in 2018 after which she developed LBP with L sciatica which was treated with injections and lumbar laminectomy and decompression microdiscectomy w/o relief - this remains an ongoing issue in addition to the more recent onset of R hip and knee pain. Deficits include pain; increased ttp and muscle tension in R ITB, hip flexors and B glutes; limited proximal flexibility; decreased strength; antalgic gait and limited activity tolerance. Pain limits positional tolerance for sitting, standing and walking which limits her ability to complete normal daily tasks, interferes with her sleep, causes her  to have to rely on a rollator to ambulate and keeps her from driving. Cadence will benefit from skilled PT to address above deficits to reduce or eliminate R hip and knee pain, restore functional LE strength and improve transitional mobility and walking/activity tolerance.    Personal Factors and Comorbidities Comorbidity 3+;Age;Time since onset of injury/illness/exacerbation;Past/Current Experience;Fitness    Comorbidities R TKA 2018, R hip & knee OA, LBP with L sciatica s/p lumbar laminectomy with microdiscectomy, GERD    Examination-Activity Limitations Bathing;Bed Mobility;Bend;Caring for Others;Locomotion Byrd;Sit;Sleep;Squat;Stand;Toileting;Transfers;Stairs    Examination-Participation Restrictions Cleaning;Community Activity;Driving;Interpersonal Relationship;Laundry;Meal Prep;Shop;Yard Work    Stability/Clinical Decision Making Evolving/Moderate complexity    Clinical Decision Making Moderate    Rehab Potential Good    PT Frequency 2x / week    PT Duration 8 weeks    PT Treatment/Interventions ADLs/Self Care Home Management;Cryotherapy;Lobbyist  Stimulation;Iontophoresis 4mg /ml Dexamethasone;Moist Heat;Traction;Ultrasound;DME Instruction;Gait training;Stair training;Functional mobility training;Therapeutic activities;Therapeutic exercise;Balance training;Neuromuscular re-education;Patient/family education;Manual techniques;Passive range of motion;Dry needling;Taping;Vasopneumatic Device    PT Next Visit Plan assess LE flexibility; create initial HEP    Consulted and Agree with Plan of Care Patient           Patient will benefit from skilled therapeutic intervention in order to improve the following deficits and impairments:  Abnormal gait,Decreased activity tolerance,Decreased balance,Decreased endurance,Decreased knowledge of precautions,Decreased knowledge of use of DME,Decreased mobility,Decreased range of motion,Decreased safety awareness,Decreased strength,Difficulty walking,Increased  fascial restricitons,Increased muscle spasms,Impaired perceived functional ability,Impaired flexibility,Improper body mechanics,Postural dysfunction,Pain  Visit Diagnosis: Pain in right hip  Chronic pain of right knee  Muscle weakness (generalized)  Difficulty in walking, not elsewhere classified  Chronic bilateral low back pain with left-sided sciatica     Problem List Patient Active Problem List   Diagnosis Date Noted  . History of total right knee replacement 03/05/2020  . Primary osteoarthritis of right hip 03/05/2020  . Primary osteoarthritis of left knee 03/05/2020    05/03/2020, PT, MPT 03/19/2020, 3:28 PM  [facility] [address]  [address] [place], [place], [place] Phone: [phone]   Fax:  [phone]  Name: Tiffany Byrd Name MRN: Tiffany Byrd Date of Birth: [birth year]

## 2020-03-22 ENCOUNTER — Ambulatory Visit: Payer: Medicare Other | Admitting: Physical Therapy

## 2020-03-22 ENCOUNTER — Encounter: Payer: Self-pay | Admitting: Physical Therapy

## 2020-03-22 ENCOUNTER — Other Ambulatory Visit: Payer: Self-pay

## 2020-03-22 DIAGNOSIS — M25551 Pain in right hip: Secondary | ICD-10-CM

## 2020-03-22 DIAGNOSIS — M6281 Muscle weakness (generalized): Secondary | ICD-10-CM

## 2020-03-22 DIAGNOSIS — G8929 Other chronic pain: Secondary | ICD-10-CM

## 2020-03-22 DIAGNOSIS — R262 Difficulty in walking, not elsewhere classified: Secondary | ICD-10-CM

## 2020-03-22 DIAGNOSIS — M5442 Lumbago with sciatica, left side: Secondary | ICD-10-CM

## 2020-03-22 DIAGNOSIS — M25561 Pain in right knee: Secondary | ICD-10-CM

## 2020-03-22 NOTE — Therapy (Signed)
[facility] [address]  [address] [place], [place], [address] Phone: [phone]   Fax:  [phone]  Physical Therapy Treatment  Patient Details  Name: Tiffany Byrd MRN: [id number] Date of Birth: 1940-06-13 Referring Provider (PT): Myra Rude, MD   Encounter Date: 03/22/2020   PT End of Session - 03/22/20 0929    Visit Number 2    Number of Visits 16    Date for PT Re-Evaluation 05/14/20    Authorization Type UHC Medicare    PT Start Time 0929    PT Stop Time 1018    PT Time Calculation (min) 49 min    Activity Tolerance Patient tolerated treatment well    Behavior During Therapy [facility] for tasks assessed/performed           Past Medical History:  Diagnosis Date  . GERD (gastroesophageal reflux disease)   . Hyperlipidemia   . Thyroid disease     Past Surgical History:  Procedure Laterality Date  . ABDOMINAL HYSTERECTOMY    . CHOLECYSTECTOMY    . LUMBAR LAMINECTOMY/DECOMPRESSION MICRODISCECTOMY    . REPLACEMENT TOTAL KNEE      There were no vitals filed for this visit.   Subjective Assessment - 03/22/20 0933    Subjective Pt reporting her pain is more in the groin area today.    Pertinent History R TKA 2018, scoliosis    Patient Stated Goals "move w/o an aide (rollator or cane)"    Currently in Pain? Yes    Pain Score 0-No pain    Pain Location Knee    Pain Orientation Right    Pain Frequency Intermittent    Pain Score 5    Pain Location Groin    Pain Orientation Right;Anterior    Pain Descriptors / Indicators Dull    Pain Type Acute pain    Pain Frequency Constant    Pain Score 0    Pain Location Back    Pain Orientation Lower    Pain Frequency Intermittent              [facility] PT Assessment - 03/22/20 0929      Flexibility   Soft Tissue Assessment /Muscle Length yes    Hamstrings WFL/mild tight    Quadriceps mild/mod tight hip flexors    ITB mod/severe tight    Piriformis severe  tight                         [facility] Adult PT Treatment/Exercise - 03/22/20 0929      Exercises   Exercises Knee/Hip      Knee/Hip Exercises: Stretches   Passive Hamstring Stretch Right;2 reps;30 seconds    Passive Hamstring Stretch Limitations supine with strap & seated hip hinge - pt preferring the latter    Hip Flexor Stretch Right;1 rep;30 seconds    Hip Flexor Stretch Limitations seated lunge position over edge of chair    ITB Stretch Right;2 reps;30 seconds    ITB Stretch Limitations supine crossbody with strap & standing lateral lean over counter - pt preferring the latter    Piriformis Stretch Right;2 reps;30 seconds    Piriformis Stretch Limitations supine KTOS & side-sitting with hip ER on mat table - pt preferring the latter    Other Knee/Hip Stretches R hip adductor stretch in seated side lunge position x 30 sec      Knee/Hip Exercises: Aerobic   Recumbent Bike L1 x 6  min      Knee/Hip Exercises: Seated   Ball Squeeze B hip ADD pillow squeeze 10 x 5"    Clamshell with TheraBand Yellow   10 x 5"   Other Seated Knee/Hip Exercises Hip ADD pillow squeeze + R hip IR 10 x 3"    Marching Both;10 reps;Strengthening    Marching Limitations looped yellow TB at knees                  PT Education - 03/22/20 1015    Education Details Initial HEP - MedBridge Access Code: [email or web address]    Person(s) Educated Patient    Methods Explanation;Demonstration;Verbal cues;Handout    Comprehension Verbalized understanding;Verbal cues required;Returned demonstration;Need further instruction            PT Short Term Goals - 03/22/20 0932      PT SHORT TERM GOAL #1   Title Patient will be independent with initial HEP    Status On-going    Target Date 04/09/20             PT Long Term Goals - 03/22/20 0932      PT LONG TERM GOAL #1   Title Patient will be independent with ongoing/advanced HEP for self-management at home    Status On-going    Target Date  05/14/20      PT LONG TERM GOAL #2   Title Decrease R hip and knee pain by >/= 50-75% allowing patient increased ease of mobility and decreased dependence on AD    Status On-going    Target Date 05/14/20      PT LONG TERM GOAL #3   Title Patient will demonstrate improved B LE strength to >/= 4 to 4+/5 for improved stability and ease of mobility    Status On-going    Target Date 05/14/20      PT LONG TERM GOAL #4   Title Patient will ambulate with normal gait pattern with or w/o LRAD    Status On-going    Target Date 05/14/20                 Plan - 03/22/20 0932    Clinical Impression Statement Flexibility assessment revealing greatest tightness/discomfort in R hip rotators, ITB and hip adductors/flexors with Deryn noting pain/discomfort mostly in the R anterior hip and groin today. HEP created to address areas of tightness as well as initiate basic core/proximal LE strengthening with pt able to perform good return demonstration for most stretches/exercise although excessive tightness in hip rotators limiting piriformis stretch and hip IE/ER. PT may benefit from manual stretching and/or STM to address this tightness and pain next visit.    Comorbidities R TKA 2018, R hip & knee OA, LBP with L sciatica s/p lumbar laminectomy with microdiscectomy, GERD    Rehab Potential Good    PT Frequency 2x / week    PT Duration 8 weeks    PT Treatment/Interventions ADLs/Self Care Home Management;Cryotherapy;Electrical Stimulation;Iontophoresis 4mg /ml Dexamethasone;Moist Heat;Traction;Ultrasound;DME Instruction;Gait training;Stair training;Functional mobility training;Therapeutic activities;Therapeutic exercise;Balance training;Neuromuscular re-education;Patient/family education;Manual techniques;Passive range of motion;Dry needling;Taping;Vasopneumatic Device    PT Next Visit Plan review initial HEP; stretches and manual therapy as indicated to improve proximal flexibility; core and LE  strengthening    PT Home Exercise Plan 1/27 - MedBridge Access Code: [email or web address]    Consulted and Agree with Plan of Care Patient           Patient will benefit from skilled therapeutic intervention in order to improve the following deficits  and impairments:  Abnormal gait,Decreased activity tolerance,Decreased balance,Decreased endurance,Decreased knowledge of precautions,Decreased knowledge of use of DME,Decreased mobility,Decreased range of motion,Decreased safety awareness,Decreased strength,Difficulty walking,Increased fascial restricitons,Increased muscle spasms,Impaired perceived functional ability,Impaired flexibility,Improper body mechanics,Postural dysfunction,Pain  Visit Diagnosis: Pain in right hip  Chronic pain of right knee  Muscle weakness (generalized)  Difficulty in walking, not elsewhere classified  Chronic bilateral low back pain with left-sided sciatica     Problem List Patient Active Problem List   Diagnosis Date Noted  . History of total right knee replacement 03/05/2020  . Primary osteoarthritis of right hip 03/05/2020  . Primary osteoarthritis of left knee 03/05/2020    Marry Guan, PT, MPT 03/22/2020, 12:51 PM  [facility] [address]  [address] [place], [place], [address] Phone: [phone]   Fax:  [phone]  Name: Tiffany Byrd MRN: [id number] Date of Birth: 1940-03-22

## 2020-03-22 NOTE — Patient Instructions (Signed)
       Access Code: [email or web address] URL: [email or web address] Date: 03/22/2020 Prepared by: Glenetta Hew  Exercises Standing ITB Stretch - 2 x daily - 7 x weekly - 3 reps - 30 sec hold Seated Hip Adductor Stretch - 2 x daily - 7 x weekly - 3 reps - 30 sec hold Seated Hamstring Stretch - 2 x daily - 7 x weekly - 3 reps - 30 sec hold Seated Table Piriformis Stretch - 2 x daily - 7 x weekly - 3 reps - 30 sec hold Seated Hip Flexor Stretch - 2 x daily - 7 x weekly - 3 reps - 30 sec hold Seated Hip Abduction with Resistance - 1 x daily - 7 x weekly - 10 reps - 5 sec hold Seated March with Resistance - 1 x daily - 7 x weekly - 10 reps - 3 sec hold Seated Hip Adduction Squeeze with Ball - 1 x daily - 7 x weekly - 10 reps - 5 sec hold Seated Hip Internal Rotation AROM - 1 x daily - 7 x weekly - 10 reps - 3 sec hold

## 2020-03-26 ENCOUNTER — Other Ambulatory Visit: Payer: Self-pay

## 2020-03-26 ENCOUNTER — Ambulatory Visit: Payer: Medicare Other | Admitting: Physical Therapy

## 2020-03-26 ENCOUNTER — Encounter: Payer: Self-pay | Admitting: Physical Therapy

## 2020-03-26 DIAGNOSIS — R262 Difficulty in walking, not elsewhere classified: Secondary | ICD-10-CM

## 2020-03-26 DIAGNOSIS — M25551 Pain in right hip: Secondary | ICD-10-CM

## 2020-03-26 DIAGNOSIS — M6281 Muscle weakness (generalized): Secondary | ICD-10-CM

## 2020-03-26 DIAGNOSIS — G8929 Other chronic pain: Secondary | ICD-10-CM

## 2020-03-26 NOTE — Therapy (Signed)
[facility] Outpatient Rehabilitation [facility] [address]  [address] [place], [place], [address] Phone: [phone]   Fax:  [phone]  Physical Therapy Treatment  Patient Details  Name: Tiffany Byrd MRN: [id number] Date of Birth: 1940/03/12 Referring Provider (PT): Myra Rude, MD   Encounter Date: 03/26/2020   PT End of Session - 03/26/20 0845    Visit Number 3    Number of Visits 16    Date for PT Re-Evaluation 05/14/20    Authorization Type UHC Medicare    PT Start Time 0845    PT Stop Time 0929    PT Time Calculation (min) 44 min    Activity Tolerance Patient tolerated treatment well    Behavior During Therapy [facility] for tasks assessed/performed           Past Medical History:  Diagnosis Date  . GERD (gastroesophageal reflux disease)   . Hyperlipidemia   . Thyroid disease     Past Surgical History:  Procedure Laterality Date  . ABDOMINAL HYSTERECTOMY    . CHOLECYSTECTOMY    . LUMBAR LAMINECTOMY/DECOMPRESSION MICRODISCECTOMY    . REPLACEMENT TOTAL KNEE      There were no vitals filed for this visit.   Subjective Assessment - 03/26/20 0848    Subjective Pt reports pain still mostly in the groin - feels like pain is decreasing. Notes greatest difficulty with hip flexor stretch.    Pertinent History R TKA 2018, scoliosis    Patient Stated Goals "move w/o an aide (rollator or cane)"    Currently in Pain? Yes    Pain Score 0-No pain    Pain Location Knee    Pain Orientation Right    Pain Score 6    Pain Location Groin    Pain Orientation Right;Anterior;Medial;Lateral    Pain Descriptors / Indicators Dull    Pain Type Acute pain    Pain Frequency Intermittent                             [facility] Adult PT Treatment/Exercise - 03/26/20 0845      Exercises   Exercises Knee/Hip      Knee/Hip Exercises: Stretches   Passive Hamstring Stretch Right;2 reps;30 seconds    Passive Hamstring Stretch Limitations seated hip  hinge    Hip Flexor Stretch Right;2 reps;30 seconds    Hip Flexor Stretch Limitations seated lunge position over edge of chair - cues to sit closer to edge of chair with hip of leg to be stretched slightly over edge of seat    ITB Stretch Right;2 reps;30 seconds    ITB Stretch Limitations standing lateral lean over counter    Piriformis Stretch Right;2 reps;30 seconds    Piriformis Stretch Limitations supine KTOS & side-sitting with hip ER on mat table - still limited tolerance for both positions    Other Knee/Hip Stretches R hip adductor stretch in seated side lunge position 2 x 30 sec      Knee/Hip Exercises: Aerobic   Nustep L4 x 6 min (UE/LE)      Knee/Hip Exercises: Seated   Ball Squeeze B hip ADD pillow squeeze 10 x 5"    Clamshell with TheraBand Red   10 x 5"   Other Seated Knee/Hip Exercises Hip ADD pillow squeeze + R hip IR 10 x 3"   pt reporting increased pain in groin & lateral hip   Marching Both;10 reps;Strengthening    Marching  Limitations looped red TB at knees - cues to slow pace      Manual Therapy   Manual Therapy Soft tissue mobilization;Myofascial release;Other (comment);Passive ROM    Soft tissue mobilization STM/DTM to R TFL, distal iliopsoas, hip adductors & quads - most ttp over iliopsoas and hip adductors    Myofascial Release STM/DTM to R distal iliopsoas, hip adductors & VL    Passive ROM manual R piriformis stretch    Other Manual Therapy Provided instuciton in self-STM using rolling pin to quads, hip adductors, ITB & HS                    PT Short Term Goals - 03/22/20 0932      PT SHORT TERM GOAL #1   Title Patient will be independent with initial HEP    Status On-going    Target Date 04/09/20             PT Long Term Goals - 03/22/20 0932      PT LONG TERM GOAL #1   Title Patient will be independent with ongoing/advanced HEP for self-management at home    Status On-going    Target Date 05/14/20      PT LONG TERM GOAL #2   Title  Decrease R hip and knee pain by >/= 50-75% allowing patient increased ease of mobility and decreased dependence on AD    Status On-going    Target Date 05/14/20      PT LONG TERM GOAL #3   Title Patient will demonstrate improved B LE strength to >/= 4 to 4+/5 for improved stability and ease of mobility    Status On-going    Target Date 05/14/20      PT LONG TERM GOAL #4   Title Patient will ambulate with normal gait pattern with or w/o LRAD    Status On-going    Target Date 05/14/20                 Plan - 03/26/20 0851    Clinical Impression Statement Tiffany Byrd reports pain remains mostly localized in R groin but feels that pain is becoming less frequent and less intense. HEP reviewed with pt noting greatest difficulty with hip flexor stretch - positioning clarified for hip flexor stretch with better tolerance noted but still experiencing limited hip ER making piriformis stretch difficult. Cues for pacing necessary with strengthening exercises but pt able to tolerate increased theraband resistance to red TB with clams & marching. Manual therapy targeting increased muscle tension/tightness in R hip and thigh with ttp noted over distal iliopsoas and proximal & distal hip adductors along with tightness in VL and ITB - able to reduce ttp and muscle tension with MT and pt instructed in self-STM using rolling pin for carryover at home.    Comorbidities R TKA 2018, R hip & knee OA, LBP with L sciatica s/p lumbar laminectomy with microdiscectomy, GERD    Rehab Potential Good    PT Frequency 2x / week    PT Duration 8 weeks    PT Treatment/Interventions ADLs/Self Care Home Management;Cryotherapy;Electrical Stimulation;Iontophoresis 4mg /ml Dexamethasone;Moist Heat;Traction;Ultrasound;DME Instruction;Gait training;Stair training;Functional mobility training;Therapeutic activities;Therapeutic exercise;Balance training;Neuromuscular re-education;Patient/family education;Manual techniques;Passive range  of motion;Dry needling;Taping;Vasopneumatic Device    PT Next Visit Plan stretches and manual therapy as indicated to improve proximal flexibility; core and LE strengthening    PT Home Exercise Plan 1/27 - MedBridge Access Code: [email or web address]    Consulted and Agree with Plan of Care Patient  Patient will benefit from skilled therapeutic intervention in order to improve the following deficits and impairments:  Abnormal gait,Decreased activity tolerance,Decreased balance,Decreased endurance,Decreased knowledge of precautions,Decreased knowledge of use of DME,Decreased mobility,Decreased range of motion,Decreased safety awareness,Decreased strength,Difficulty walking,Increased fascial restricitons,Increased muscle spasms,Impaired perceived functional ability,Impaired flexibility,Improper body mechanics,Postural dysfunction,Pain  Visit Diagnosis: Pain in right hip  Chronic pain of right knee  Muscle weakness (generalized)  Difficulty in walking, not elsewhere classified  Chronic bilateral low back pain with left-sided sciatica     Problem List Patient Active Problem List   Diagnosis Date Noted  . History of total right knee replacement 03/05/2020  . Primary osteoarthritis of right hip 03/05/2020  . Primary osteoarthritis of left knee 03/05/2020    Marry Guan, PT, MPT 03/26/2020, 10:22 AM  [facility] [address]  [address] [place], [place], [address] Phone: [phone]   Fax:  [phone]  Name: Tiffany Byrd MRN: [id number] Date of Birth: [birth year]

## 2020-03-29 ENCOUNTER — Ambulatory Visit: Payer: Medicare Other | Attending: Family Medicine

## 2020-03-29 ENCOUNTER — Other Ambulatory Visit: Payer: Self-pay

## 2020-03-29 DIAGNOSIS — M25561 Pain in right knee: Secondary | ICD-10-CM | POA: Insufficient documentation

## 2020-03-29 DIAGNOSIS — M25551 Pain in right hip: Secondary | ICD-10-CM

## 2020-03-29 DIAGNOSIS — G8929 Other chronic pain: Secondary | ICD-10-CM | POA: Diagnosis present

## 2020-03-29 DIAGNOSIS — R262 Difficulty in walking, not elsewhere classified: Secondary | ICD-10-CM | POA: Diagnosis present

## 2020-03-29 DIAGNOSIS — M5442 Lumbago with sciatica, left side: Secondary | ICD-10-CM | POA: Insufficient documentation

## 2020-03-29 DIAGNOSIS — M6281 Muscle weakness (generalized): Secondary | ICD-10-CM | POA: Insufficient documentation

## 2020-03-29 NOTE — Therapy (Signed)
[facility] Outpatient Rehabilitation [facility] [address]  [address] [place], [place], [address] Phone: [phone]   Fax:  [phone]  Physical Therapy Treatment  Patient Details  Name: Tiffany Byrd MRN: [id number] Date of Birth: [birth year] Referring Provider (PT): Myra Rude, MD   Encounter Date: 03/29/2020   PT End of Session - 03/29/20 1024    Visit Number 4    Number of Visits 16    Date for PT Re-Evaluation 05/14/20    Authorization Type UHC Medicare    PT Start Time 0930    PT Stop Time 1015    PT Time Calculation (min) 45 min    Activity Tolerance Patient tolerated treatment well    Behavior During Therapy [facility] for tasks assessed/performed           Past Medical History:  Diagnosis Date  . GERD (gastroesophageal reflux disease)   . Hyperlipidemia   . Thyroid disease     Past Surgical History:  Procedure Laterality Date  . ABDOMINAL HYSTERECTOMY    . CHOLECYSTECTOMY    . LUMBAR LAMINECTOMY/DECOMPRESSION MICRODISCECTOMY    . REPLACEMENT TOTAL KNEE      There were no vitals filed for this visit.   Subjective Assessment - 03/29/20 0932    Subjective Pain is getting better in the groin area, outside of knee is still there but not as much. Stretches doing well but some difficulty with the stretches on the bed. Feels hands on therapy last time helped alot. Feels proud that she is able to reach down without as much pain now. Reports L sciatica pain seems to be more limiting especially for her balance and states she plans on bringing this up to MD.    Pertinent History R TKA 2018, scoliosis    Limitations Sitting;Standing;Walking;House hold activities;Lifting    How long can you sit comfortably? has to constantly change positions    How long can you stand comfortably? 20 minutes    How long can you walk comfortably? never w/o pain, but can usually walk up to 30 minutes    Patient Stated Goals "move w/o an aide (rollator or cane)"                              [facility] Adult PT Treatment/Exercise - 03/29/20 0001      Knee/Hip Exercises: Stretches   Passive Hamstring Stretch Right;2 reps;30 seconds    Passive Hamstring Stretch Limitations seated hip hinge    Hip Flexor Stretch Right;2 reps;30 seconds    Hip Flexor Stretch Limitations seated lunge position over edge of chair - cues to sit closer to edge of chair with hip of leg to be stretched slightly over edge of seat    Piriformis Stretch Right;2 reps;30 seconds    Piriformis Stretch Limitations supine KTOS & side-sitting with hip ER on mat table - still limited tolerance for both positions    Other Knee/Hip Stretches R hip adductor stretch in seated side lunge position 2 x 30 sec      Knee/Hip Exercises: Aerobic   Nustep L4 x 6 min (UE/LE)      Knee/Hip Exercises: Seated   Ball Squeeze B hip ADD pillow squeeze 10 x 5"    Other Seated Knee/Hip Exercises Seated hip IR and ER x5 reps with yellow TB    Marching Both;10 reps;Strengthening    Marching Limitations looped red TB at knees - cues to slow  pace    Abduction/Adduction  Strengthening;Both;2 sets;10 reps   red TB looped     Manual Therapy   Manual Therapy Soft tissue mobilization;Myofascial release;Other (comment);Passive ROM;Joint mobilization    Joint Mobilization Gentle grade / R hip distraction in flexion, lateral distraction, distraction with ER/IR mobilizations    Soft tissue mobilization STM/DTM to R TFL, distal iliopsoas, hip adductors & quads - most ttp over iliopsoas and hip adductors    Myofascial Release STM/DTM to R distal iliopsoas, hip adductors & VL    Passive ROM manual R piriformis stretch                    PT Short Term Goals - 03/22/20 0932      PT SHORT TERM GOAL #1   Title Patient will be independent with initial HEP    Status On-going    Target Date 04/09/20             PT Long Term Goals - 03/22/20 0932      PT LONG TERM GOAL #1   Title Patient  will be independent with ongoing/advanced HEP for self-management at home    Status On-going    Target Date 05/14/20      PT LONG TERM GOAL #2   Title Decrease R hip and knee pain by >/= 50-75% allowing patient increased ease of mobility and decreased dependence on AD    Status On-going    Target Date 05/14/20      PT LONG TERM GOAL #3   Title Patient will demonstrate improved B LE strength to >/= 4 to 4+/5 for improved stability and ease of mobility    Status On-going    Target Date 05/14/20      PT LONG TERM GOAL #4   Title Patient will ambulate with normal gait pattern with or w/o LRAD    Status On-going    Target Date 05/14/20                 Plan - 03/29/20 8119    Clinical Impression Statement Noretta reprts she has been feeling better since starting therapy. R groin pain is getting better. Reviewed HEP stretches again today with modification to complete them on sofa instead of bed since bed is very high. Pt in agreement. she required max verbal and visual cues for review of seated hip IR AROM, limited by tightness and pain, had not been doing them correctly at home but after practice in session dmeos improved understanding. Introduced gentle manual R hip mobilizations with distraction and ER/IR with good tolerance and no exacerbations of pain. Continue per POC    Personal Factors and Comorbidities Comorbidity 3+;Age;Time since onset of injury/illness/exacerbation;Past/Current Experience;Fitness    Comorbidities R TKA 2018, R hip & knee OA, LBP with L sciatica s/p lumbar laminectomy with microdiscectomy, GERD    Examination-Activity Limitations Bathing;Bed Mobility;Bend;Caring for Others;Locomotion Level;Sit;Sleep;Squat;Stand;Toileting;Transfers;Stairs    Rehab Potential Good    PT Frequency 2x / week    PT Duration 8 weeks    PT Treatment/Interventions ADLs/Self Care Home Management;Cryotherapy;Electrical Stimulation;Iontophoresis 4mg /ml Dexamethasone;Moist  Heat;Traction;Ultrasound;DME Instruction;Gait training;Stair training;Functional mobility training;Therapeutic activities;Therapeutic exercise;Balance training;Neuromuscular re-education;Patient/family education;Manual techniques;Passive range of motion;Dry needling;Taping;Vasopneumatic Device    PT Next Visit Plan stretches and manual therapy as indicated to improve proximal flexibility; core and LE strengthening    PT Home Exercise Plan 1/27 - MedBridge Access Code: [email or web address]    Consulted and Agree with Plan of Care Patient  Patient will benefit from skilled therapeutic intervention in order to improve the following deficits and impairments:  Abnormal gait,Decreased activity tolerance,Decreased balance,Decreased endurance,Decreased knowledge of precautions,Decreased knowledge of use of DME,Decreased mobility,Decreased range of motion,Decreased safety awareness,Decreased strength,Difficulty walking,Increased fascial restricitons,Increased muscle spasms,Impaired perceived functional ability,Impaired flexibility,Improper body mechanics,Postural dysfunction,Pain  Visit Diagnosis: Pain in right hip  Chronic pain of right knee  Muscle weakness (generalized)  Difficulty in walking, not elsewhere classified  Chronic bilateral low back pain with left-sided sciatica     Problem List Patient Active Problem List   Diagnosis Date Noted  . History of total right knee replacement 03/05/2020  . Primary osteoarthritis of right hip 03/05/2020  . Primary osteoarthritis of left knee 03/05/2020    Anson Crofts, PT, DPT 03/29/2020, 10:29 AM  [facility] [address]  [address] [place], [place], [address] Phone: [phone]   Fax:  [phone]  Name: Amarys Sliwinski MRN: [id number] Date of Birth: [birth year]

## 2020-04-02 ENCOUNTER — Encounter: Payer: Medicare Other | Admitting: Physical Therapy

## 2020-04-03 ENCOUNTER — Ambulatory Visit: Payer: Medicare Other

## 2020-04-05 ENCOUNTER — Telehealth: Payer: No Typology Code available for payment source | Admitting: Family Medicine

## 2020-04-05 ENCOUNTER — Ambulatory Visit: Payer: Medicare Other

## 2020-04-05 ENCOUNTER — Other Ambulatory Visit: Payer: Self-pay

## 2020-04-05 DIAGNOSIS — R262 Difficulty in walking, not elsewhere classified: Secondary | ICD-10-CM

## 2020-04-05 DIAGNOSIS — M6281 Muscle weakness (generalized): Secondary | ICD-10-CM

## 2020-04-05 DIAGNOSIS — M25551 Pain in right hip: Secondary | ICD-10-CM | POA: Diagnosis not present

## 2020-04-05 DIAGNOSIS — G8929 Other chronic pain: Secondary | ICD-10-CM

## 2020-04-05 NOTE — Therapy (Signed)
[facility] Outpatient Rehabilitation [facility] [address]  [address] [place], [place], [address] Phone: [phone]   Fax:  [phone]  Physical Therapy Treatment  Patient Details  Name: Tiffany Byrd MRN: [id number] Date of Birth: 1940-09-15 Referring Provider (PT): Myra Rude, MD   Encounter Date: 04/05/2020   PT End of Session - 04/05/20 1223    Visit Number 5    Number of Visits 16    Date for PT Re-Evaluation 05/14/20    Authorization Type UHC Medicare    PT Start Time 0930    PT Stop Time 1015    PT Time Calculation (min) 45 min    Activity Tolerance Patient tolerated treatment well    Behavior During Therapy [facility] for tasks assessed/performed           Past Medical History:  Diagnosis Date  . GERD (gastroesophageal reflux disease)   . Hyperlipidemia   . Thyroid disease     Past Surgical History:  Procedure Laterality Date  . ABDOMINAL HYSTERECTOMY    . CHOLECYSTECTOMY    . LUMBAR LAMINECTOMY/DECOMPRESSION MICRODISCECTOMY    . REPLACEMENT TOTAL KNEE      There were no vitals filed for this visit.   Subjective Assessment - 04/05/20 0934    Subjective Pt reports seeing PCP recently and was given pain medication and feels like she has been doing good, getting relief. Could not remember name of pain med.    Pertinent History R TKA 2018, scoliosis    Limitations Sitting;Standing;Walking;House hold activities;Lifting    How long can you sit comfortably? has to constantly change positions    How long can you stand comfortably? 20 minutes    How long can you walk comfortably? never w/o pain, but can usually walk up to 30 minutes    Currently in Pain? Yes    Pain Score 7     Pain Location Knee    Pain Orientation Right    Pain Descriptors / Indicators Aching;Dull    Pain Type Acute pain    Pain Onset More than a month ago    Pain Score 4    Pain Location Groin    Pain Orientation Right;Medial;Anterior    Pain Descriptors /  Indicators Dull    Pain Type Acute pain    Pain Onset More than a month ago    Pain Score 0    Pain Location Back    Pain Orientation Lower                             [facility] Adult PT Treatment/Exercise - 04/05/20 0943      Knee/Hip Exercises: Stretches   Passive Hamstring Stretch Right;2 reps;30 seconds    Passive Hamstring Stretch Limitations seated hip hinge    Other Knee/Hip Stretches R hip adductor stretch in seated side lunge position 2 x 30 sec      Knee/Hip Exercises: Aerobic   Nustep L4 x 6 min (UE/LE)      Knee/Hip Exercises: Seated   Ball Squeeze B hip ADD green ball squeeze 10 x 5" x 2 sets    Clamshell with TheraBand Red   10 x 3   Marching Both;10 reps;Strengthening;3 sets    Marching Limitations looped red TB at knees - cues to slow pace      Knee/Hip Exercises: Supine   Bridges with [employer] AROM;Strengthening;Both;5 sets;2 sets    Other Supine Knee/Hip Exercises  Supine hip 90-90 switches ER/IR 5 x 5" holds bilateral      Manual Therapy   Manual Therapy Soft tissue mobilization;Myofascial release;Other (comment);Passive ROM;Joint mobilization    Joint Mobilization Gentle grade / R hip distraction in flexion, lateral distraction, distraction with ER/IR mobilizations    Soft tissue mobilization STM/DTM to R TFL, distal iliopsoas, hip adductors & quads - most ttp over iliopsoas and hip adductors    Myofascial Release STM/DTM to R distal iliopsoas, hip adductors & VL    Passive ROM manual R piriformis stretch                    PT Short Term Goals - 03/22/20 0932      PT SHORT TERM GOAL #1   Title Patient will be independent with initial HEP    Status On-going    Target Date 04/09/20             PT Long Term Goals - 03/22/20 0932      PT LONG TERM GOAL #1   Title Patient will be independent with ongoing/advanced HEP for self-management at home    Status On-going    Target Date 05/14/20      PT LONG TERM GOAL #2    Title Decrease R hip and knee pain by >/= 50-75% allowing patient increased ease of mobility and decreased dependence on AD    Status On-going    Target Date 05/14/20      PT LONG TERM GOAL #3   Title Patient will demonstrate improved B LE strength to >/= 4 to 4+/5 for improved stability and ease of mobility    Status On-going    Target Date 05/14/20      PT LONG TERM GOAL #4   Title Patient will ambulate with normal gait pattern with or w/o LRAD    Status On-going    Target Date 05/14/20                 Plan - 04/05/20 1002    Clinical Impression Statement Tiffany Byrd reports she is doing well pain is doing better but also started new pain medication , took it last night so still feeeling a bit groggy this morning.Exercises all going well. She tolerated progression of exercises very nicely. She demonstrated improved AROM quality of movement with seated hip IR. Trialed 90-90 hip switches in supine today (starting with feet positioned slightly wider than hip width apart) and she demosntrated improved tolerance to AROM hip ER/IR. She continues to have tender points throughout right proximal adductors, less so at the proximal hip flexors. She responded nicely to manual therapy, STM/DTM and gentle hip mobs with lateral distraction reporting decreased anterior groin pain exacerbation, only feeling stretch. Continue per POC    Personal Factors and Comorbidities Comorbidity 3+;Age;Time since onset of injury/illness/exacerbation;Past/Current Experience;Fitness    Comorbidities R TKA 2018, R hip & knee OA, LBP with L sciatica s/p lumbar laminectomy with microdiscectomy, GERD    Examination-Activity Limitations Bathing;Bed Mobility;Bend;Caring for Others;Locomotion Level;Sit;Sleep;Squat;Stand;Toileting;Transfers;Stairs    Examination-Participation Restrictions Cleaning;Community Activity;Driving;Interpersonal Relationship;Laundry;Meal Prep;Shop;Yard Work    Rehab Potential Good    PT Frequency 2x /  week    PT Duration 8 weeks    PT Treatment/Interventions ADLs/Self Care Home Management;Cryotherapy;Electrical Stimulation;Iontophoresis 4mg /ml Dexamethasone;Moist Heat;Traction;Ultrasound;DME Instruction;Gait training;Stair training;Functional mobility training;Therapeutic activities;Therapeutic exercise;Balance training;Neuromuscular re-education;Patient/family education;Manual techniques;Passive range of motion;Dry needling;Taping;Vasopneumatic Device    PT Next Visit Plan stretches and manual therapy as indicated to improve proximal flexibility; core and LE  strengthening.    PT Home Exercise Plan 1/27 - MedBridge Access Code: [email or web address]    Consulted and Agree with Plan of Care Patient           Patient will benefit from skilled therapeutic intervention in order to improve the following deficits and impairments:  Abnormal gait,Decreased activity tolerance,Decreased balance,Decreased endurance,Decreased knowledge of precautions,Decreased knowledge of use of DME,Decreased mobility,Decreased range of motion,Decreased safety awareness,Decreased strength,Difficulty walking,Increased fascial restricitons,Increased muscle spasms,Impaired perceived functional ability,Impaired flexibility,Improper body mechanics,Postural dysfunction,Pain  Visit Diagnosis: Pain in right hip  Chronic pain of right knee  Muscle weakness (generalized)  Difficulty in walking, not elsewhere classified  Chronic bilateral low back pain with left-sided sciatica     Problem List Patient Active Problem List   Diagnosis Date Noted  . History of total right knee replacement 03/05/2020  . Primary osteoarthritis of right hip 03/05/2020  . Primary osteoarthritis of left knee 03/05/2020    Anson Crofts, PT, DPT 04/05/2020, 12:26 PM  [facility] [address]  [address] [place], [place], [address] Phone: [phone]   Fax:  [phone]  Name: Tiffany Byrd MRN: [id number] Date of Birth: Mar 10, 1940

## 2020-04-09 ENCOUNTER — Ambulatory Visit (HOSPITAL_BASED_OUTPATIENT_CLINIC_OR_DEPARTMENT_OTHER)
Admission: RE | Admit: 2020-04-09 | Discharge: 2020-04-09 | Disposition: A | Discharge status: Home / Self Care | Payer: Medicare Other | Source: Ambulatory Visit | Attending: Family Medicine | Admitting: Family Medicine

## 2020-04-09 ENCOUNTER — Other Ambulatory Visit: Payer: Self-pay

## 2020-04-09 ENCOUNTER — Ambulatory Visit (INDEPENDENT_AMBULATORY_CARE_PROVIDER_SITE_OTHER): Payer: Medicare Other | Admitting: Family Medicine

## 2020-04-09 ENCOUNTER — Ambulatory Visit: Payer: Medicare Other | Admitting: Physical Therapy

## 2020-04-09 ENCOUNTER — Encounter: Payer: Self-pay | Admitting: Physical Therapy

## 2020-04-09 ENCOUNTER — Ambulatory Visit: Payer: Self-pay

## 2020-04-09 VITALS — BP 120/80 | Ht 61.0 in | Wt 160.0 lb

## 2020-04-09 DIAGNOSIS — M6281 Muscle weakness (generalized): Secondary | ICD-10-CM

## 2020-04-09 DIAGNOSIS — M1712 Unilateral primary osteoarthritis, left knee: Secondary | ICD-10-CM | POA: Insufficient documentation

## 2020-04-09 DIAGNOSIS — S82142A Displaced bicondylar fracture of left tibia, initial encounter for closed fracture: Secondary | ICD-10-CM | POA: Insufficient documentation

## 2020-04-09 DIAGNOSIS — M25551 Pain in right hip: Secondary | ICD-10-CM | POA: Diagnosis not present

## 2020-04-09 DIAGNOSIS — G8929 Other chronic pain: Secondary | ICD-10-CM

## 2020-04-09 DIAGNOSIS — R262 Difficulty in walking, not elsewhere classified: Secondary | ICD-10-CM

## 2020-04-09 MED ORDER — TRIAMCINOLONE ACETONIDE 40 MG/ML IJ SUSP
40.0000 mg | Freq: Once | INTRAMUSCULAR | Status: AC
  Administered 2020-04-09: 40 mg via INTRA_ARTICULAR

## 2020-04-09 NOTE — Patient Instructions (Signed)
Good to see you Please use ice as needed  Please continue the exercises  I will call with the results from today   Please send me a message in MyChart with any questions or updates.  Please see me back in 4-6 weeks.   --Dr. Giavonna Pflum  

## 2020-04-09 NOTE — Progress Notes (Signed)
Tiffany Byrd - 80 y.o. female MRN [id number]  Date of birth: 1940/11/17  SUBJECTIVE:  Including CC & ROS.  No chief complaint on file.   Tiffany Byrd is a 80 y.o. female that is presenting with acute left hip pain.  She had a fall recently.  She is unsure of the exact mechanism of the fall where she landed on her knee.   Review of Systems See HPI   HISTORY: Past Medical, Surgical, Social, and Family History Reviewed & Updated per EMR.   Pertinent Historical Findings include:  Past Medical History:  Diagnosis Date  . GERD (gastroesophageal reflux disease)   . Hyperlipidemia   . Thyroid disease     Past Surgical History:  Procedure Laterality Date  . ABDOMINAL HYSTERECTOMY    . CHOLECYSTECTOMY    . LUMBAR LAMINECTOMY/DECOMPRESSION MICRODISCECTOMY    . REPLACEMENT TOTAL KNEE      No family history on file.  Social History   Socioeconomic History  . Marital status: Married    Spouse name: Not on file  . Number of children: Not on file  . Years of education: Not on file  . Highest education level: Not on file  Occupational History  . Not on file  Tobacco Use  . Smoking status: Never Smoker  . Smokeless tobacco: Never Used  Substance and Sexual Activity  . Alcohol use: Not on file  . Drug use: Not on file  . Sexual activity: Not on file  Other Topics Concern  . Not on file  Social History Narrative  . Not on file   Social Determinants of Health   Financial Resource Strain: Not on file  Food Insecurity: Not on file  Transportation Needs: Not on file  Physical Activity: Not on file  Stress: Not on file  Social Connections: Not on file  Intimate Partner Violence: Not on file     PHYSICAL EXAM:  VS: BP 120/80 (BP Location: Left Arm, Patient Position: Sitting, Cuff Size: Large)   Ht 5\' 1"  (1.549 m)   Wt 160 lb (72.6 kg)   BMI 30.23 kg/m  Physical Exam Gen: NAD, alert, cooperative with exam, well-appearing MSK:  Left knee: Mild effusion. Normal range  of motion. Tenderness palpation of the joint line. Neurovascular intact  Aspiration/Injection Procedure Note Shell Blanchette 1940/10/31  Procedure: Injection Indications: Left knee pain  Procedure Details Consent: Risks of procedure as well as the alternatives and risks of each were explained to the (patient/caregiver).  Consent for procedure obtained. Time Out: Verified patient identification, verified procedure, site/side was marked, verified correct patient position, special equipment/implants available, medications/allergies/relevent history reviewed, required imaging and test results available.  Performed.  The area was cleaned with iodine and alcohol swabs.    The left knee superior lateral suprapatellar pouch was injected using 1 cc's of 40 mg Kenalog and 4 cc's of 0.25% bupivacaine with a 22 1 1/2" needle.  Ultrasound was used. Images were obtained in long views showing the injection.     A sterile dressing was applied.  Patient did tolerate procedure well.     ASSESSMENT & PLAN:   Primary osteoarthritis of left knee Recently had a fall but unsure if she landed on the knee itself.  Does have degenerative changes that are likely exacerbated. -Counseled on home exercise therapy and supportive care. -Injection. -X-ray. -Could consider gel injections or physical therapy.  Closed fracture of left tibial plateau Found on xray. Depressed on in nature.  - knee immobilizer  - f/u  in 2 weeks. Repeat xrays.

## 2020-04-09 NOTE — Assessment & Plan Note (Signed)
Recently had a fall but unsure if she landed on the knee itself.  Does have degenerative changes that are likely exacerbated. -Counseled on home exercise therapy and supportive care. -Injection. -X-ray. -Could consider gel injections or physical therapy.

## 2020-04-09 NOTE — Patient Instructions (Signed)
     Access Code: [email or web address] URL: [email or web address] Date: 04/09/2020 Prepared by: Glenetta Hew  Exercises Seated Knee Extension with Resistance - 1 x daily - 7 x weekly - 2 sets - 10 reps - 3 sec hold Seated Hamstring Curls with Resistance - 1 x daily - 7 x weekly - 2 sets - 10 reps - 3 sec hold Squat with Chair Touch and Resistance Loop - 1 x daily - 7 x weekly - 2 sets - 10 reps - 3 sec hold

## 2020-04-09 NOTE — Therapy (Addendum)
[facility] [address]  [address] [place], [place], [address] Phone: [phone]   Fax:  [phone]  Physical Therapy Treatment / Discharge Summary  Patient Details  Name: Tiffany Byrd MRN: [id number] Date of Birth: 13-Dec-1940 Referring Provider (PT): Myra Rude, MD   Encounter Date: 04/09/2020   PT End of Session - 04/09/20 1015    Visit Number 6    Number of Visits 16    Date for PT Re-Evaluation 05/14/20    Authorization Type [employer] Medicare    PT Start Time 1015    PT Stop Time 1102    PT Time Calculation (min) 47 min    Activity Tolerance Patient tolerated treatment well    Behavior During Therapy [facility] for tasks assessed/performed           Past Medical History:  Diagnosis Date  . GERD (gastroesophageal reflux disease)   . Hyperlipidemia   . Thyroid disease     Past Surgical History:  Procedure Laterality Date  . ABDOMINAL HYSTERECTOMY    . CHOLECYSTECTOMY    . LUMBAR LAMINECTOMY/DECOMPRESSION MICRODISCECTOMY    . REPLACEMENT TOTAL KNEE      There were no vitals filed for this visit.   Subjective Assessment - 04/09/20 1017    Subjective Pt reports a fall on Friday where her L knee gave way. Saw Dr. Jordan Likes today who gave her an injection in the L knee.    Pertinent History R TKA 2018, scoliosis    Limitations Sitting;Standing;Walking;House hold activities;Lifting    How long can you stand comfortably? --    How long can you walk comfortably? --    Patient Stated Goals "move w/o an aide (rollator or cane)"    Currently in Pain? Yes    Pain Score 5     Pain Location Hip    Pain Orientation Right    Pain Descriptors / Indicators Discomfort    Pain Type Acute pain    Pain Onset --    Pain Frequency Intermittent    Pain Onset More than a month ago              [facility] PT Assessment - 04/09/20 0930      Assessment   Next MD Visit 05/07/20                         [facility]  PT Treatment/Exercise - 04/09/20 1015      Knee/Hip Exercises: Aerobic   Nustep L4 x 6 min (UE/LE)      Knee/Hip Exercises: Standing   Hip Abduction Right;Left;10 reps;2 sets;Stengthening;Knee straight    Abduction Limitations looped red TB at ankles; UE support on back of chair for balance    Hip Extension Right;Left;10 reps;2 sets;Stengthening;Knee straight    Extension Limitations looped red TB at ankles - cues to pause 2-3" before returning to neutral position; UE support on back of chair for balance    Functional Squat 10 reps;3 seconds;2 sets    Functional Squat Limitations 1/2 squat + red TB hip ABD isometric; UE support on back of chair      Knee/Hip Exercises: Seated   Long Arc Quad Right;Left;10 reps;2 sets;Strengthening    Long Arc Quad Limitations + hip ADD ball squeeze & looped red TB at ankles    Clamshell with TheraBand Red   15 x 3"   Marching Both;10 reps;2 sets;Strengthening    Marching Limitations looped red  TB at knees    Hamstring Curl Right;Left;10 reps;2 sets;Strengthening    Hamstring Limitations + hip ADD ball squeeze & looped red TB at ankles                  PT Education - 04/09/20 1100    Education Details HEP update - MedBridge Access Code: [email or web address]    Person(s) Educated Patient    Methods Explanation;Demonstration;Verbal cues;Handout    Comprehension Verbalized understanding;Verbal cues required;Returned demonstration;Need further instruction            PT Short Term Goals - 04/09/20 1020      PT SHORT TERM GOAL #1   Title Patient will be independent with initial HEP    Status Achieved   04/09/20            PT Long Term Goals - 03/22/20 0932      PT LONG TERM GOAL #1   Title Patient will be independent with ongoing/advanced HEP for self-management at home    Status On-going    Target Date 05/14/20      PT LONG TERM GOAL #2   Title Decrease R hip and knee pain by >/= 50-75% allowing patient increased ease of mobility and  decreased dependence on AD    Status On-going    Target Date 05/14/20      PT LONG TERM GOAL #3   Title Patient will demonstrate improved B LE strength to >/= 4 to 4+/5 for improved stability and ease of mobility    Status On-going    Target Date 05/14/20      PT LONG TERM GOAL #4   Title Patient will ambulate with normal gait pattern with or w/o LRAD    Status On-going    Target Date 05/14/20                 Plan - 04/09/20 1022    Clinical Impression Statement Tiffany Byrd reports her pain is improving with improving ROM and tolerance for stretches. She did note a fall on Friday which she attributes to the L knee buckling but denies any new pain or injury as a result. Initial HEP going well with pt denying need for further review but clarification provided confirming that it is okay to perform stretches and exercises bilaterally - STG met. Progressed LE strengthening with addition of quad and HS strengthening as well as progression of hip strengthening with standing resisted hip motions. Pt noting fatigue with some exercises requiring seated rest breaks but denies increased pain and requested HEP instructions for some of exercises.    Comorbidities R TKA 2018, R hip & knee OA, LBP with L sciatica s/p lumbar laminectomy with microdiscectomy, GERD    Rehab Potential Good    PT Frequency 2x / week    PT Duration 8 weeks    PT Treatment/Interventions ADLs/Self Care Home Management;Cryotherapy;Electrical Stimulation;Iontophoresis 4mg /ml Dexamethasone;Moist Heat;Traction;Ultrasound;DME Instruction;Gait training;Stair training;Functional mobility training;Therapeutic activities;Therapeutic exercise;Balance training;Neuromuscular re-education;Patient/family education;Manual techniques;Passive range of motion;Dry needling;Taping;Vasopneumatic Device    PT Next Visit Plan stretches and manual therapy as indicated to improve proximal flexibility; core and LE strengthening - progressing standing  activities/exercises as tolerated    PT Home Exercise Plan 1/27 - MedBridge Access Code: [email or web address]; 2/14 - [email or web address]    Consulted and Agree with Plan of Care Patient           Patient will benefit from skilled therapeutic intervention in order to improve the following deficits and impairments:  Abnormal gait,Decreased activity tolerance,Decreased  balance,Decreased endurance,Decreased knowledge of precautions,Decreased knowledge of use of DME,Decreased mobility,Decreased range of motion,Decreased safety awareness,Decreased strength,Difficulty walking,Increased fascial restricitons,Increased muscle spasms,Impaired perceived functional ability,Impaired flexibility,Improper body mechanics,Postural dysfunction,Pain  Visit Diagnosis: Pain in right hip  Chronic pain of right knee  Muscle weakness (generalized)  Difficulty in walking, not elsewhere classified  Chronic bilateral low back pain with left-sided sciatica     Problem List Patient Active Problem List   Diagnosis Date Noted  . History of total right knee replacement 03/05/2020  . Primary osteoarthritis of right hip 03/05/2020  . Primary osteoarthritis of left knee 03/05/2020    Percival Spanish, PT, MPT 04/09/2020, 12:34 PM  [facility] [address]  [place], [place], [address] Phone: [phone]   Fax:  [phone]  Name: Tiffany Byrd MRN: [id number] Date of Birth: [birth year]  PHYSICAL THERAPY DISCHARGE SUMMARY  Visits from Start of Care: 6  Current functional level related to goals / functional outcomes:   Refer to above clinical impression for status as of last visit on 04/09/2020. On 04/09/20, patient was identified to have suffered a tibial plateau fracture as a result of her fall on 04/07/20 and was placed in an immobilizer and instructed to stop PT. She has not been able to return to PT in >30-days, therefore will proceed with discharge from PT for this  episode. She may benefit from resumption of PT once fracture resolved.   Remaining deficits:   Unable to assess due to inability to return to PT.   Education / Equipment   HEP   Plan: Patient agrees to discharge.  Patient goals were partially met. Patient is being discharged due to a change in medical status.  ?????     Percival Spanish, PT, MPT 05/14/20, 10:00 AM  [facility] [address]  [place], [place], [address] Phone: [phone]   Fax:  [phone]

## 2020-04-09 NOTE — Assessment & Plan Note (Signed)
Found on xray. Depressed on in nature.  - knee immobilizer  - f/u in 2 weeks. Repeat xrays.

## 2020-04-10 ENCOUNTER — Telehealth: Payer: Self-pay | Admitting: Family Medicine

## 2020-04-10 NOTE — Telephone Encounter (Signed)
Disreguard--glh

## 2020-04-16 ENCOUNTER — Encounter: Payer: Medicare Other | Admitting: Physical Therapy

## 2020-04-23 ENCOUNTER — Ambulatory Visit (HOSPITAL_BASED_OUTPATIENT_CLINIC_OR_DEPARTMENT_OTHER)
Admission: RE | Admit: 2020-04-23 | Discharge: 2020-04-23 | Disposition: A | Discharge status: Home / Self Care | Payer: Medicare Other | Source: Ambulatory Visit | Attending: Family Medicine | Admitting: Family Medicine

## 2020-04-23 ENCOUNTER — Other Ambulatory Visit: Payer: Self-pay

## 2020-04-23 ENCOUNTER — Ambulatory Visit (INDEPENDENT_AMBULATORY_CARE_PROVIDER_SITE_OTHER): Payer: Medicare Other | Admitting: Family Medicine

## 2020-04-23 ENCOUNTER — Other Ambulatory Visit: Payer: Self-pay | Admitting: Family Medicine

## 2020-04-23 DIAGNOSIS — S82142D Displaced bicondylar fracture of left tibia, subsequent encounter for closed fracture with routine healing: Secondary | ICD-10-CM

## 2020-04-23 NOTE — Progress Notes (Signed)
  Tiffany Byrd - 80 y.o. female MRN [id number]  Date of birth: [birth year]  SUBJECTIVE:  Including CC & ROS.  No chief complaint on file.   Tiffany Byrd is a 80 y.o. female that is following up for her left knee pain.  She had a fall and what appeared to have a depressed tibial plateau fracture on 2/14.  Has been in a knee immobilizer and limited weightbearing since that time.   Review of Systems See HPI   HISTORY: Past Medical, Surgical, Social, and Family History Reviewed & Updated per EMR.   Pertinent Historical Findings include:  Past Medical History:  Diagnosis Date  . GERD (gastroesophageal reflux disease)   . Hyperlipidemia   . Thyroid disease     Past Surgical History:  Procedure Laterality Date  . ABDOMINAL HYSTERECTOMY    . CHOLECYSTECTOMY    . LUMBAR LAMINECTOMY/DECOMPRESSION MICRODISCECTOMY    . REPLACEMENT TOTAL KNEE      No family history on file.  Social History   Socioeconomic History  . Marital status: Married    Spouse name: Not on file  . Number of children: Not on file  . Years of education: Not on file  . Highest education level: Not on file  Occupational History  . Not on file  Tobacco Use  . Smoking status: Never Smoker  . Smokeless tobacco: Never Used  Substance and Sexual Activity  . Alcohol use: Not on file  . Drug use: Not on file  . Sexual activity: Not on file  Other Topics Concern  . Not on file  Social History Narrative  . Not on file   Social Determinants of Health   Financial Resource Strain: Not on file  Food Insecurity: Not on file  Transportation Needs: Not on file  Physical Activity: Not on file  Stress: Not on file  Social Connections: Not on file  Intimate Partner Violence: Not on file     PHYSICAL EXAM:  VS: BP 137/84   Ht 5\' 1"  (1.549 m)   Wt 160 lb (72.6 kg)   BMI 30.23 kg/m  Physical Exam Gen: NAD, alert, cooperative with exam, well-appearing    ASSESSMENT & PLAN:   Closed fracture of left tibial  plateau Occurred after fall that happened around 2/12. Independent review of the xray today seems to show more depression.  - counseled on partial to no weight bearing  - continue knee immobilizer  - xray  - referral to ortho

## 2020-04-23 NOTE — Assessment & Plan Note (Addendum)
Occurred after fall that happened around 2/12. Independent review of the xray today seems to show more depression.  - counseled on partial to no weight bearing  - continue knee immobilizer  - xray  - referral to ortho

## 2020-04-23 NOTE — Patient Instructions (Addendum)
Good to see you Please continue the brace Please try to limit weight bearing on the knee   Please send me a message in MyChart with any questions or updates.  I will call about the medication  Please follow up in 1 month.     Dr Jodi Geralds [facility] [address] [place]  Phone: [phone] Tuesday 04/24/20 at 1015a Arrival time is 945a   --Dr. Jordan Likes

## 2020-04-25 ENCOUNTER — Telehealth: Payer: Self-pay | Admitting: Family Medicine

## 2020-04-25 DIAGNOSIS — S82142D Displaced bicondylar fracture of left tibia, subsequent encounter for closed fracture with routine healing: Secondary | ICD-10-CM

## 2020-04-25 NOTE — Telephone Encounter (Signed)
Informed of results and pursue lab work.   Myra Rude, MD [facility] 04/25/2020, 8:24 AM

## 2020-05-07 ENCOUNTER — Ambulatory Visit: Payer: Medicare Other | Admitting: Family Medicine

## 2020-05-23 LAB — PROTEIN ELECTROPHORESIS, SERUM
A/G Ratio: 1.1 (ref 0.7–1.7)
Albumin ELP: 3.6 g/dL (ref 2.9–4.4)
Alpha 1: 0.2 g/dL (ref 0.0–0.4)
Alpha 2: 0.9 g/dL (ref 0.4–1.0)
Beta: 0.9 g/dL (ref 0.7–1.3)
Gamma Globulin: 1.2 g/dL (ref 0.4–1.8)
Globulin, Total: 3.3 g/dL (ref 2.2–3.9)
M-Spike, %: 0.3 g/dL — ABNORMAL HIGH
Total Protein: 6.9 g/dL (ref 6.0–8.5)

## 2020-05-23 LAB — CBC WITH DIFFERENTIAL
Basophils Absolute: 0.1 10*3/uL (ref 0.0–0.2)
Basos: 1 %
EOS (ABSOLUTE): 0.3 10*3/uL (ref 0.0–0.4)
Eos: 4 %
Hematocrit: 36.3 % (ref 34.0–46.6)
Hemoglobin: 12.2 g/dL (ref 11.1–15.9)
Immature Grans (Abs): 0 10*3/uL (ref 0.0–0.1)
Immature Granulocytes: 0 %
Lymphocytes Absolute: 2 10*3/uL (ref 0.7–3.1)
Lymphs: 25 %
MCH: 28.4 pg (ref 26.6–33.0)
MCHC: 33.6 g/dL (ref 31.5–35.7)
MCV: 84 fL (ref 79–97)
Monocytes Absolute: 1.2 10*3/uL — ABNORMAL HIGH (ref 0.1–0.9)
Monocytes: 14 %
Neutrophils Absolute: 4.6 10*3/uL (ref 1.4–7.0)
Neutrophils: 56 %
RBC: 4.3 x10E6/uL (ref 3.77–5.28)
RDW: 13.2 % (ref 11.7–15.4)
WBC: 8.2 10*3/uL (ref 3.4–10.8)

## 2020-05-23 LAB — TSH: TSH: 2.09 u[IU]/mL (ref 0.450–4.500)

## 2020-05-28 ENCOUNTER — Other Ambulatory Visit: Payer: Self-pay

## 2020-05-28 ENCOUNTER — Encounter: Payer: Self-pay | Admitting: Family Medicine

## 2020-05-28 ENCOUNTER — Ambulatory Visit (INDEPENDENT_AMBULATORY_CARE_PROVIDER_SITE_OTHER): Payer: Medicare Other | Admitting: Family Medicine

## 2020-05-28 VITALS — BP 140/72 | Ht 61.0 in | Wt 160.0 lb

## 2020-05-28 DIAGNOSIS — D8989 Other specified disorders involving the immune mechanism, not elsewhere classified: Secondary | ICD-10-CM | POA: Diagnosis not present

## 2020-05-28 DIAGNOSIS — M1712 Unilateral primary osteoarthritis, left knee: Secondary | ICD-10-CM | POA: Diagnosis not present

## 2020-05-28 MED ORDER — HYDROCODONE-ACETAMINOPHEN 5-325 MG PO TABS: 1.0000 | ORAL_TABLET | Freq: Three times a day (TID) | ORAL | 0 refills | Status: DC | PRN

## 2020-05-28 NOTE — Patient Instructions (Signed)
Good to see you Please try the compression We will setup the kidney doctor referral    Please send me a message in MyChart with any questions or updates.  We will call once the gel injection is in .   --Dr. Jordan Likes

## 2020-05-28 NOTE — Progress Notes (Signed)
  Tiffany Byrd - 80 y.o. female MRN [id number]  Date of birth: [birth year]  SUBJECTIVE:  Including CC & ROS.  No chief complaint on file.   Tiffany Byrd is a 80 y.o. female that is following up for her lab work and presenting with worsening of her left knee pain.   Review of Systems See HPI   HISTORY: Past Medical, Surgical, Social, and Family History Reviewed & Updated per EMR.   Pertinent Historical Findings include:  Past Medical History:  Diagnosis Date  . GERD (gastroesophageal reflux disease)   . Hyperlipidemia   . Thyroid disease     Past Surgical History:  Procedure Laterality Date  . ABDOMINAL HYSTERECTOMY    . CHOLECYSTECTOMY    . LUMBAR LAMINECTOMY/DECOMPRESSION MICRODISCECTOMY    . REPLACEMENT TOTAL KNEE      No family history on file.  Social History   Socioeconomic History  . Marital status: Married    Spouse name: Not on file  . Number of children: Not on file  . Years of education: Not on file  . Highest education level: Not on file  Occupational History  . Not on file  Tobacco Use  . Smoking status: Never Smoker  . Smokeless tobacco: Never Used  Substance and Sexual Activity  . Alcohol use: Not on file  . Drug use: Not on file  . Sexual activity: Not on file  Other Topics Concern  . Not on file  Social History Narrative  . Not on file   Social Determinants of Health   Financial Resource Strain: Not on file  Food Insecurity: Not on file  Transportation Needs: Not on file  Physical Activity: Not on file  Stress: Not on file  Social Connections: Not on file  Intimate Partner Violence: Not on file     PHYSICAL EXAM:  VS: BP 140/72 (BP Location: Left Arm, Patient Position: Sitting, Cuff Size: Large)   Ht 5\' 1"  (1.549 m)   Wt 160 lb (72.6 kg)   BMI 30.23 kg/m  Physical Exam Gen: NAD, alert, cooperative with exam, well-appearing MSK:  Mild effusion. Tenderness along the joint line. Limited range of motion. Instability on valgus  varus stress testing. Neurovascularly intact   ASSESSMENT & PLAN:   Primary osteoarthritis of left knee Has underlying degenerative changes but also likely insufficiency fracture as well.  Has been using a Rollator to help take some of the weight and pressure off of the knee. -Counseled on home exercise therapy and supportive care. -Pursue gel injections. -Norco   Monoclonal immunoglobulin deposition disease (HCC) Had a faint M spike on her lab work.  Concerned it may be associated with her insufficiency fracture given her normal DEXA scan. -Referral to nephrology.

## 2020-05-29 DIAGNOSIS — D8989 Other specified disorders involving the immune mechanism, not elsewhere classified: Secondary | ICD-10-CM | POA: Insufficient documentation

## 2020-05-29 NOTE — Assessment & Plan Note (Signed)
Had a faint M spike on her lab work.  Concerned it may be associated with her insufficiency fracture given her normal DEXA scan. -Referral to nephrology.

## 2020-05-29 NOTE — Assessment & Plan Note (Signed)
Has underlying degenerative changes but also likely insufficiency fracture as well.  Has been using a Rollator to help take some of the weight and pressure off of the knee. -Counseled on home exercise therapy and supportive care. -Pursue gel injections. -Norco

## 2020-06-01 ENCOUNTER — Telehealth: Payer: Self-pay | Admitting: *Deleted

## 2020-06-01 NOTE — Telephone Encounter (Signed)
received fax from [employer]- pt is approved for Synvisc-one. She will pay her usual co pay. No answer. Left message for patient to call back.

## 2020-06-20 NOTE — Telephone Encounter (Signed)
Left message for patient to call back to schedule visit for Synvisc-one left knee injection.

## 2020-06-25 NOTE — Telephone Encounter (Signed)
Received patient's Synvisc-one. Pt is scheduled 06/28/20 to receive injection.

## 2020-06-28 ENCOUNTER — Ambulatory Visit: Payer: Self-pay

## 2020-06-28 ENCOUNTER — Other Ambulatory Visit: Payer: Self-pay

## 2020-06-28 ENCOUNTER — Encounter: Payer: Self-pay | Admitting: Family Medicine

## 2020-06-28 ENCOUNTER — Ambulatory Visit (INDEPENDENT_AMBULATORY_CARE_PROVIDER_SITE_OTHER): Payer: Medicare Other | Admitting: Family Medicine

## 2020-06-28 DIAGNOSIS — M1712 Unilateral primary osteoarthritis, left knee: Secondary | ICD-10-CM

## 2020-06-28 NOTE — Assessment & Plan Note (Addendum)
Continues to have inflammatory changes around lateral compartment.  Aspiration was rated red tinged/bloody today.  Has nephrology appointment later this month.  Completed gel injection today.  - f/u in 4 weeks.  - The patient will benefit from the at home use of a Zynex NexWave with the clinically proven, TENS, IFC and NMES modalities to reduce pain, improve function and reduce medication use.  Continued use of the above drug-free treatment options are both reasonable and medically necessary at this time.

## 2020-06-28 NOTE — Progress Notes (Addendum)
Tiffany Byrd - 80 y.o. female MRN [id number]  Date of birth: 17-Jun-1940  SUBJECTIVE:  Including CC & ROS.  No chief complaint on file.   Tiffany Byrd is a 80 y.o. female that is her for her gel injection.    Review of Systems See HPI   HISTORY: Past Medical, Surgical, Social, and Family History Reviewed & Updated per EMR.   Pertinent Historical Findings include:  Past Medical History:  Diagnosis Date  . GERD (gastroesophageal reflux disease)   . Hyperlipidemia   . Thyroid disease     Past Surgical History:  Procedure Laterality Date  . ABDOMINAL HYSTERECTOMY    . CHOLECYSTECTOMY    . LUMBAR LAMINECTOMY/DECOMPRESSION MICRODISCECTOMY    . REPLACEMENT TOTAL KNEE      History reviewed. No pertinent family history.  Social History   Socioeconomic History  . Marital status: Married    Spouse name: Not on file  . Number of children: Not on file  . Years of education: Not on file  . Highest education level: Not on file  Occupational History  . Not on file  Tobacco Use  . Smoking status: Never Smoker  . Smokeless tobacco: Never Used  Substance and Sexual Activity  . Alcohol use: Not on file  . Drug use: Not on file  . Sexual activity: Not on file  Other Topics Concern  . Not on file  Social History Narrative  . Not on file   Social Determinants of Health   Financial Resource Strain: Not on file  Food Insecurity: Not on file  Transportation Needs: Not on file  Physical Activity: Not on file  Stress: Not on file  Social Connections: Not on file  Intimate Partner Violence: Not on file     PHYSICAL EXAM:  VS: BP 110/70 (BP Location: Left Arm, Patient Position: Sitting, Cuff Size: Large)   Ht 5\' 1"  (1.549 m)   Wt 160 lb (72.6 kg)   BMI 30.23 kg/m  Physical Exam Gen: NAD, alert, cooperative with exam, well-appearing    Aspiration/Injection Procedure Note Tiffany Byrd 01/16/1941  Procedure: Injection Indications: left knee pain   Procedure  Details Consent: Risks of procedure as well as the alternatives and risks of each were explained to the (patient/caregiver).  Consent for procedure obtained. Time Out: Verified patient identification, verified procedure, site/side was marked, verified correct patient position, special equipment/implants available, medications/allergies/relevent history reviewed, required imaging and test results available.  Performed.  The area was cleaned with iodine and alcohol swabs.    The left knee superior lateral suprapatellar pouch was injected using 4 cc's of 1% lidocaine with a 25 1 1/2" needle.  An 18-gauge 1-1/2 inch needle was used to achieve aspiration.  The syringe was switched and 6 mL of synvisc onewas injected. Ultrasound was used. Images were obtained in  Long views showing the injection.    Amount of Fluid Aspirated: 29mL Character of Fluid: red colored Fluid was sent for:n/a A sterile dressing was applied.  Patient did tolerate procedure well.     ASSESSMENT & PLAN:   Primary osteoarthritis of left knee Continues to have inflammatory changes around lateral compartment.  Aspiration was rated red tinged/bloody today.  Has nephrology appointment later this month.  Completed gel injection today.  - f/u in 4 weeks.  - The patient will benefit from the at home use of a Zynex NexWave with the clinically proven, TENS, IFC and NMES modalities to reduce pain, improve function and reduce medication use.  Continued  use of the above drug-free treatment options are both reasonable and medically necessary at this time.

## 2020-06-28 NOTE — Patient Instructions (Signed)
Good to see you Please try ice   Please send me a message in MyChart with any questions or updates.  Please see me back in 4 weeks.   --Dr. Mehki Klumpp  

## 2020-08-02 ENCOUNTER — Other Ambulatory Visit: Payer: Self-pay

## 2020-08-02 ENCOUNTER — Ambulatory Visit (INDEPENDENT_AMBULATORY_CARE_PROVIDER_SITE_OTHER): Payer: Medicare Other | Admitting: Family Medicine

## 2020-08-02 DIAGNOSIS — M1611 Unilateral primary osteoarthritis, right hip: Secondary | ICD-10-CM

## 2020-08-02 DIAGNOSIS — M1712 Unilateral primary osteoarthritis, left knee: Secondary | ICD-10-CM

## 2020-08-02 MED ORDER — TRIAMCINOLONE ACETONIDE 40 MG/ML IJ SUSP: 40.0000 mg | Freq: Once | INTRAMUSCULAR | Status: AC

## 2020-08-02 NOTE — Assessment & Plan Note (Signed)
Has arthroplasty scheduled for later this month.  Hopefully that helps alleviate her other lower extremity pains.

## 2020-08-02 NOTE — Progress Notes (Signed)
  Tiffany Byrd - 80 y.o. female MRN [id number]  Date of birth: 1940/10/28  SUBJECTIVE:  Including CC & ROS.  No chief complaint on file.   Tiffany Byrd is a 80 y.o. female that is following up from her left knee pain.  The pain is intermittent in nature.  She notices less pain when she is not walking as much.  She still uses a rolling walker.   Review of Systems See HPI   HISTORY: Past Medical, Surgical, Social, and Family History Reviewed & Updated per EMR.   Pertinent Historical Findings include:  Past Medical History:  Diagnosis Date   GERD (gastroesophageal reflux disease)    Hyperlipidemia    Thyroid disease     Past Surgical History:  Procedure Laterality Date   ABDOMINAL HYSTERECTOMY     CHOLECYSTECTOMY     LUMBAR LAMINECTOMY/DECOMPRESSION MICRODISCECTOMY     REPLACEMENT TOTAL KNEE      No family history on file.  Social History   Socioeconomic History   Marital status: Married    Spouse name: Not on file   Number of children: Not on file   Years of education: Not on file   Highest education level: Not on file  Occupational History   Not on file  Tobacco Use   Smoking status: Never   Smokeless tobacco: Never  Substance and Sexual Activity   Alcohol use: Not on file   Drug use: Not on file   Sexual activity: Not on file  Other Topics Concern   Not on file  Social History Narrative   Not on file   Social Determinants of Health   Financial Resource Strain: Not on file  Food Insecurity: Not on file  Transportation Needs: Not on file  Physical Activity: Not on file  Stress: Not on file  Social Connections: Not on file  Intimate Partner Violence: Not on file     PHYSICAL EXAM:  VS: BP 140/80   Ht 5\' 1"  (1.549 m)   Wt 164 lb (74.4 kg)   BMI 30.99 kg/m  Physical Exam Gen: NAD, alert, cooperative with exam, well-appearing   ASSESSMENT & PLAN:   Primary osteoarthritis of left knee Pain is occurring intermittently.  She is scheduled for right  hip total arthroplasty which we will wait and see if that helps relieve some of the pressure on her left knee. -Counseled on home exercise therapy and supportive care. -Could repeat injection  Primary osteoarthritis of right hip Has arthroplasty scheduled for later this month.  Hopefully that helps alleviate her other lower extremity pains.

## 2020-08-02 NOTE — Assessment & Plan Note (Signed)
Pain is occurring intermittently.  She is scheduled for right hip total arthroplasty which we will wait and see if that helps relieve some of the pressure on her left knee. -Counseled on home exercise therapy and supportive care. -Could repeat injection

## 2020-08-24 ENCOUNTER — Encounter: Payer: Self-pay | Admitting: Physical Therapy

## 2020-08-24 ENCOUNTER — Other Ambulatory Visit: Payer: Self-pay

## 2020-08-24 ENCOUNTER — Ambulatory Visit: Payer: Medicare Other | Attending: Orthopedic Surgery | Admitting: Physical Therapy

## 2020-08-24 DIAGNOSIS — R262 Difficulty in walking, not elsewhere classified: Secondary | ICD-10-CM | POA: Diagnosis present

## 2020-08-24 DIAGNOSIS — M6281 Muscle weakness (generalized): Secondary | ICD-10-CM

## 2020-08-24 DIAGNOSIS — M25551 Pain in right hip: Secondary | ICD-10-CM

## 2020-08-24 NOTE — Therapy (Signed)
[facility] [address]  [address] [place], [place], [address] Phone: [phone]   Fax:  [phone]  Physical Therapy Evaluation  Patient Details  Name: Tiffany Byrd MRN: [id number] Date of Birth: March 09, 1940 Referring Provider (PT): Jodi Geralds, MD   Encounter Date: 08/24/2020   PT End of Session - 08/24/20 1058     Visit Number 1    Number of Visits 9    Date for PT Re-Evaluation 09/21/20    Authorization Type UHC Medicare    PT Start Time 1011    PT Stop Time 1050    PT Time Calculation (min) 39 min    Activity Tolerance Patient tolerated treatment well    Behavior During Therapy WFL for tasks assessed/performed             Past Medical History:  Diagnosis Date   GERD (gastroesophageal reflux disease)    Hyperlipidemia    Thyroid disease     Past Surgical History:  Procedure Laterality Date   ABDOMINAL HYSTERECTOMY     CHOLECYSTECTOMY     LUMBAR LAMINECTOMY/DECOMPRESSION MICRODISCECTOMY     REPLACEMENT TOTAL KNEE      There were no vitals filed for this visit.    Subjective Assessment - 08/24/20 1014     Subjective Patient reports undergoing R THA on 08/22/20. Reports that she stayed overnight at the hospital d/t her age. Wearing compression stockings in AM and PM and walking with RW inside and outside. Denies fever, chills, night sweats. Had some pain after coming home yesterday d/t over doing it. Pain is located over the R anterior hip. Still having some N/T in the R anterior thigh. Worse with trying to stand straight up from a chair, sitting on a normal commode. Better with using her bedside commode which is higher. Notes a hx of L tibia fx which was treated with gel injection but did not report much relief. Notes that she is still experiencing some pain in the L knee but it is getting better. Reports that she was using a SPC or 2WW/4WW at PLOF.    Pertinent History GERD, HLD, thyroid disease, lumbar  lami/microdiscectomy, TKA, L tibial plateau fx 02/22    Limitations Sitting;Lifting;Standing;Walking;House hold activities    How long can you sit comfortably? not limited    How long can you stand comfortably? 10 min    How long can you walk comfortably? 10 min    Diagnostic tests none recent for hip; 04/23/20 L knee xray: Again noted are findings suspicious for a lateral tibial plateau fracture. Moderate to severe tricompartmental degenerative changes of the patient's left knee.    Patient Stated Goals decrease pain    Currently in Pain? Yes    Pain Score 5     Pain Location Groin    Pain Orientation Right    Pain Descriptors / Indicators Dull    Pain Type Acute pain;Surgical pain                [facility] PT Assessment - 08/24/20 1021       Assessment   Medical Diagnosis s/p R anterior THA    Referring Provider (PT) Jodi Geralds, MD    Onset Date/Surgical Date 08/22/20    Next MD Visit 09/06/20    Prior Therapy yes      Precautions   Precautions Anterior Hip      Balance Screen   Has the patient fallen in the past 6 months No  Has the patient had a decrease in activity level because of a fear of falling?  No    Is the patient reluctant to leave their home because of a fear of falling?  No      Home Environment   Living Environment Private residence    Living Arrangements Spouse/significant other    Type of Home House    Home Access Level entry    Home Layout One level    Home Equipment Walker - 4 wheels;Walker - 2 wheels;Cane - single point;Bedside commode;Grab bars - tub/shower      Prior Function   Level of Independence Independent    Vocation Retired    Leisure walking for fitness, gardening, beach      Cognition   Overall Cognitive Status Within Functional Limits for tasks assessed      Sensation   Light Touch Impaired by gross assessment   diinished sensation over R anteiror thigh     Coordination   Gross Motor Movements are Fluid and Coordinated Yes       Posture/Postural Control   Posture/Postural Control Postural limitations    Postural Limitations Rounded Shoulders;Forward head;Decreased lumbar lordosis      ROM / Strength   AROM / PROM / Strength AROM;Strength      Strength   Strength Assessment Site Hip;Knee;Ankle    Right/Left Hip Right;Left    Right Hip Flexion 2+/5    Right Hip ABduction 4-/5    Right Hip ADduction 4/5    Left Hip Flexion 4+/5    Left Hip ABduction 4+/5    Left Hip ADduction 4+/5    Right/Left Knee Right;Left    Right Knee Flexion 4-/5    Right Knee Extension 4/5    Left Knee Flexion 4/5    Left Knee Extension 4/5   palpable crepitus   Right/Left Ankle Right;Left    Right Ankle Dorsiflexion 4/5    Right Ankle Plantar Flexion 4/5    Left Ankle Dorsiflexion 4+/5    Left Ankle Plantar Flexion 4/5      Palpation   Palpation comment R anterior hip covered in waterproof dressing; no TTP over R hip; B calf supple and nontender      Ambulation/Gait   Assistive device Rolling walker    Gait Pattern Step-to pattern;Step-through pattern;Decreased step length - left;Decreased step length - right;Decreased weight shift to right;Trunk flexed    Ambulation Surface Level;Indoor    Gait velocity decreased      Standardized Balance Assessment   Standardized Balance Assessment Five Times Sit to Stand;Timed Up and Go Test    Five times sit to stand comments  36.97 sec   using B armrests     Timed Up and Go Test   Normal TUG (seconds) 28.94   with RW                       Objective measurements completed on examination: See above findings.               PT Education - 08/24/20 1057     Education Details prognosis, POC, HEP; edu on STS transfer technique and avoiding reaching for walker when standing    Person(s) Educated Patient    Methods Explanation;Demonstration;Tactile cues;Verbal cues;Handout    Comprehension Verbalized understanding;Returned demonstration               PT Short Term Goals - 08/24/20 1150       PT SHORT TERM  GOAL #1   Title Patient will be independent with initial HEP    Time 2    Period Weeks    Status New    Target Date 09/07/20               PT Long Term Goals - 08/24/20 1150       PT LONG TERM GOAL #1   Title Patient will be independent with ongoing/advanced HEP for self-management at home    Time 4    Period Weeks    Status New    Target Date 09/21/20      PT LONG TERM GOAL #2   Title Patient to demonstrate B LE strength >/=4+/5.    Time 4    Period Weeks    Status New    Target Date 09/21/20      PT LONG TERM GOAL #3   Title Patient to complete TUG with LRAD in <14 sec in order to decrease risk of falls.    Time 4    Period Weeks    Status New    Target Date 09/21/20      PT LONG TERM GOAL #4   Title Patient will ambulate with normal gait pattern with or w/o LRAD    Time 4    Period Weeks    Status New    Target Date 09/21/20      PT LONG TERM GOAL #5   Title Patient to complete 5x STS in <15 sec in order to decrease risk of falls.    Time 4    Period Weeks    Status New    Target Date 09/21/20                    Plan - 08/24/20 1058     Clinical Impression Statement Patient is an 79y/o F presenting to [facility]T with c/o R hip pain s/p R anterior approach THA on 08/22/20. Pain is located over the R groin with some remaining N/T in the R anterior thigh. Worse with STS and sitting on her regular commode. Currently ambulating with 2WW. Also with a hx of L lateral tibial plateau fx seen on last x-rays in February 2022. Notes some remaining pain in the L knee. Patient today presenting with rounded and forward head posture, decreased B LE strength, difficulty with STS transfers, decreased gait speed, and gait deviations. Patient's scores on TUG and 5xSTS indicates an increased risk of falls. Patient was educated on gentle strengthening HEP- patient reported understanding. Would benefit from skilled PT  services 2x/week for 4 weeks to address aforementioned impairments.    Personal Factors and Comorbidities Age;Comorbidity 3+;Past/Current Experience;Time since onset of injury/illness/exacerbation    Comorbidities GERD, HLD, thyroid disease, lumbar lami/microdiscectomy, TKA, L tibial plateau fx 02/22    Examination-Activity Limitations Bathing;Sit;Bed Mobility;Sleep;Bend;Squat;Stairs;Carry;Stand;Toileting;Dressing;Transfers;Hygiene/Grooming;Lift;Locomotion Level;Reach Overhead    Examination-Participation Restrictions Meal Prep;Laundry;Driving;Shop;Community Activity;Cleaning;Church    Stability/Clinical Decision Making Stable/Uncomplicated    Clinical Decision Making Low    Rehab Potential Good    PT Frequency 2x / week    PT Duration 4 weeks    PT Treatment/Interventions ADLs/Self Care Home Management;Cryotherapy;Electrical Stimulation;Iontophoresis 4mg /ml Dexamethasone;Moist Heat;Balance training;Therapeutic exercise;Therapeutic activities;Functional mobility training;Stair training;Gait training;Ultrasound;Neuromuscular re-education;Patient/family education;Manual techniques;Vasopneumatic Device;Taping;Energy conservation;Dry needling;Passive range of motion;Scar mobilization    PT Next Visit Plan hip FOTO; reassess HEP; progress LE strength and mobility    Consulted and Agree with Plan of Care Patient  Patient will benefit from skilled therapeutic intervention in order to improve the following deficits and impairments:  Abnormal gait, Hypomobility, Increased edema, Decreased activity tolerance, Decreased strength, Increased fascial restricitons, Pain, Decreased balance, Difficulty walking, Increased muscle spasms, Decreased mobility, Improper body mechanics, Decreased range of motion, Impaired flexibility, Postural dysfunction  Visit Diagnosis: Pain in right hip  Muscle weakness (generalized)  Difficulty in walking, not elsewhere classified     Problem List Patient  Active Problem List   Diagnosis Date Noted   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   Closed fracture of left tibial plateau 04/09/2020   History of total right knee replacement 03/05/2020   Primary osteoarthritis of right hip 03/05/2020   Primary osteoarthritis of left knee 03/05/2020     Anette GuarneriYevgeniya Gustavo Meditz, PT, DPT 08/24/20 11:54 AM    [facility] [address]  [address] [place], [place], [address] Phone: [phone]   Fax:  [phone]  Name: Vinnie LevelSylvia Byrd MRN: [id number] Date of Birth: 11-Jan-1941

## 2020-08-28 ENCOUNTER — Encounter: Payer: Self-pay | Admitting: Physical Therapy

## 2020-08-28 ENCOUNTER — Ambulatory Visit: Payer: Medicare Other | Admitting: Physical Therapy

## 2020-08-28 ENCOUNTER — Other Ambulatory Visit: Payer: Self-pay

## 2020-08-28 DIAGNOSIS — M25551 Pain in right hip: Secondary | ICD-10-CM

## 2020-08-28 DIAGNOSIS — M6281 Muscle weakness (generalized): Secondary | ICD-10-CM

## 2020-08-28 DIAGNOSIS — R262 Difficulty in walking, not elsewhere classified: Secondary | ICD-10-CM

## 2020-08-28 NOTE — Therapy (Signed)
[facility] [address]  [address] [place], [place], [address] Phone: [phone]   Fax:  [phone]  Physical Therapy Treatment  Patient Details  Name: Tiffany Byrd MRN: [id number] Date of Birth: Dec 04, 1940 Referring Provider (PT): Jodi Geralds, MD   Encounter Date: 08/28/2020   PT End of Session - 08/28/20 1055     Visit Number 2    Number of Visits 9    Date for PT Re-Evaluation 09/21/20    Authorization Type UHC Medicare    PT Start Time 0930    PT Stop Time 1025   ice pack   PT Time Calculation (min) 55 min    Activity Tolerance Patient tolerated treatment well;Patient limited by pain    Behavior During Therapy [facility] for tasks assessed/performed             Past Medical History:  Diagnosis Date   GERD (gastroesophageal reflux disease)    Hyperlipidemia    Thyroid disease     Past Surgical History:  Procedure Laterality Date   ABDOMINAL HYSTERECTOMY     CHOLECYSTECTOMY     LUMBAR LAMINECTOMY/DECOMPRESSION MICRODISCECTOMY     REPLACEMENT TOTAL KNEE      There were no vitals filed for this visit.   Subjective Assessment - 08/28/20 0933     Subjective Having more pain today in the front of the hip. Notes that she has trouble lifitng her leg. Feels that maybe she over did it a little bit at home. Was prescribed Tramadol d/t Oxycodone giving her an allergic reaction.    Pertinent History GERD, HLD, thyroid disease, lumbar lami/microdiscectomy, TKA, L tibial plateau fx 02/22    Diagnostic tests none recent for hip; 04/23/20 L knee xray: Again noted are findings suspicious for a lateral tibial plateau fracture. Moderate to severe tricompartmental degenerative changes of the patient's left knee.    Patient Stated Goals decrease pain    Currently in Pain? Yes    Pain Score 8     Pain Location Groin    Pain Orientation Right    Pain Descriptors / Indicators Aching    Pain Type Acute pain;Surgical pain     Multiple Pain Sites Yes    Pain Score 8    Pain Location Knee    Pain Orientation Right;Left    Pain Descriptors / Indicators Aching    Pain Type Chronic pain                               [facility] Adult PT Treatment/Exercise - 08/28/20 0001       Exercises   Exercises Knee/Hip      Knee/Hip Exercises: Aerobic   Nustep L3 x 6 min (UEs/LEs)      Knee/Hip Exercises: Standing   Heel Raises Both;1 set;10 reps    Heel Raises Limitations in RW    Hip Flexion AROM;Both;1 set;20 reps;Knee bent    Hip Flexion Limitations in RW    Hip Abduction AROM;Right;Left;1 set;10 reps;Knee straight    Abduction Limitations holding onto chair; limited ROM      Knee/Hip Exercises: Seated   Sit to Sand 1 set;5 reps;with UE support   last 3 reps sitting on airex     Knee/Hip Exercises: Supine   Bridges Strengthening;Both;1 set;10 reps    Bridges Limitations to neutral   cues to avoid excessive ER   Other Supine Knee/Hip Exercises clam with yellow TB,  ROM to neutral 10x    Other Supine Knee/Hip Exercises hooklying ball squeeze 10x5"      Modalities   Modalities Cryotherapy      Cryotherapy   Number Minutes Cryotherapy 10 Minutes    Cryotherapy Location Hip   R   Type of Cryotherapy Ice pack                    PT Education - 08/28/20 1055     Education Details update to HEP; edu on why her hip hurts and edu on action of hip flexors during gait    Person(s) Educated Patient    Methods Explanation;Demonstration;Tactile cues;Verbal cues;Handout    Comprehension Verbalized understanding;Returned demonstration              PT Short Term Goals - 08/28/20 1057       PT SHORT TERM GOAL #1   Title Patient will be independent with initial HEP    Time 2    Period Weeks    Status On-going    Target Date 09/07/20               PT Long Term Goals - 08/28/20 1057       PT LONG TERM GOAL #1   Title Patient will be independent with ongoing/advanced HEP  for self-management at home    Time 4    Period Weeks    Status On-going      PT LONG TERM GOAL #2   Title Patient to demonstrate B LE strength >/=4+/5.    Time 4    Period Weeks    Status On-going      PT LONG TERM GOAL #3   Title Patient to complete TUG with LRAD in <14 sec in order to decrease risk of falls.    Time 4    Period Weeks    Status On-going      PT LONG TERM GOAL #4   Title Patient will ambulate with normal gait pattern with or w/o LRAD    Time 4    Period Weeks    Status On-going      PT LONG TERM GOAL #5   Title Patient to complete 5x STS in <15 sec in order to decrease risk of falls.    Time 4    Period Weeks    Status On-going                   Plan - 08/28/20 1055     Clinical Impression Statement Patient arrived to session with report of more severe pain in the R hip and B knees this AM. Notes that she feels that she over-did it at home. Patient localizes pain to B anterior knees however no calf pain, tightness, or warmth and patient denies chills, night sweats, fever. Patient demonstrated difficulty completing STS transfers today d/t B knee pain- better tolerance with seat elevated. Patient required min A for LE management with sit>supine transfers d/t R hip flexor pain and weakness. Able to complete gentle hip strengthening within neutral hip positioning without complaints. Updated HEP with mat ther-ex as patient seemed to tolerate this better than standing ther-ex today. Ended session with ice pack to R anterior hip. No complaints at end of session.    Comorbidities GERD, HLD, thyroid disease, lumbar lami/microdiscectomy, TKA, L tibial plateau fx 02/22    PT Treatment/Interventions ADLs/Self Care Home Management;Cryotherapy;Electrical Stimulation;Iontophoresis 4mg /ml Dexamethasone;Moist Heat;Balance training;Therapeutic exercise;Therapeutic activities;Functional mobility training;Stair training;Gait training;Ultrasound;Neuromuscular  re-education;Patient/family  education;Manual techniques;Vasopneumatic Device;Taping;Energy conservation;Dry needling;Passive range of motion;Scar mobilization    PT Next Visit Plan hip FOTO; reassess HEP; progress LE strength and mobility    Consulted and Agree with Plan of Care Patient             Patient will benefit from skilled therapeutic intervention in order to improve the following deficits and impairments:  Abnormal gait, Hypomobility, Increased edema, Decreased activity tolerance, Decreased strength, Increased fascial restricitons, Pain, Decreased balance, Difficulty walking, Increased muscle spasms, Decreased mobility, Improper body mechanics, Decreased range of motion, Impaired flexibility, Postural dysfunction  Visit Diagnosis: Pain in right hip  Muscle weakness (generalized)  Difficulty in walking, not elsewhere classified     Problem List Patient Active Problem List   Diagnosis Date Noted   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

## 2020-09-04 ENCOUNTER — Ambulatory Visit: Payer: Medicare Other | Admitting: Rehabilitative and Restorative Service Providers"

## 2020-09-05 ENCOUNTER — Ambulatory Visit: Payer: Medicare Other

## 2020-09-05 ENCOUNTER — Other Ambulatory Visit: Payer: Self-pay

## 2020-09-05 DIAGNOSIS — M25551 Pain in right hip: Secondary | ICD-10-CM | POA: Diagnosis not present

## 2020-09-05 DIAGNOSIS — R262 Difficulty in walking, not elsewhere classified: Secondary | ICD-10-CM

## 2020-09-05 DIAGNOSIS — M6281 Muscle weakness (generalized): Secondary | ICD-10-CM

## 2020-09-05 NOTE — Therapy (Signed)
[facility] [address]  [place], [place], [address] Phone: [phone]   Fax:  [phone]  Physical Therapy Treatment  Patient Details  Name: Tiffany Byrd MRN: [id number] Date of Birth: 07-Aug-1940 Referring Provider (PT): Dorna Leitz, MD   Encounter Date: 09/05/2020   PT End of Session - 09/05/20 1111     Visit Number 3    Number of Visits 9    Date for PT Re-Evaluation 09/21/20    Authorization Type UHC Medicare    PT Start Time 1008    PT Stop Time 1056    PT Time Calculation (min) 48 min    Activity Tolerance Patient tolerated treatment well;Patient limited by pain    Behavior During Therapy [facility] for tasks assessed/performed             Past Medical History:  Diagnosis Date   GERD (gastroesophageal reflux disease)    Hyperlipidemia    Thyroid disease     Past Surgical History:  Procedure Laterality Date   ABDOMINAL HYSTERECTOMY     CHOLECYSTECTOMY     LUMBAR LAMINECTOMY/DECOMPRESSION MICRODISCECTOMY     REPLACEMENT TOTAL KNEE      There were no vitals filed for this visit.   Subjective Assessment - 09/05/20 1015     Subjective Pt reports that her L knee has been bothering her more than her R hip. Has been walking and doing home exercises.    Pertinent History GERD, HLD, thyroid disease, lumbar lami/microdiscectomy, TKA, L tibial plateau fx 02/22    Diagnostic tests none recent for hip; 04/23/20 L knee xray: Again noted are findings suspicious for a lateral tibial plateau fracture. Moderate to severe tricompartmental degenerative changes of the patient's left knee.    Patient Stated Goals decrease pain    Currently in Pain? Yes    Pain Score 6     Pain Location Hip    Pain Orientation Left    Pain Descriptors / Indicators Aching    Pain Type Acute pain                               [facility] Adult PT Treatment/Exercise - 09/05/20 0001       Ambulation/Gait    Ambulation/Gait Yes    Ambulation/Gait Assistance 5: Supervision    Ambulation Distance (Feet) 180 Feet    Assistive device Rolling walker    Gait Pattern Step-through pattern;Decreased weight shift to right;Trunk flexed    Ambulation Surface Level;Indoor      Exercises   Exercises Knee/Hip      Knee/Hip Exercises: Aerobic   Nustep L3 x 6 min (UEs/LEs)      Knee/Hip Exercises: Standing   Hip Flexion AROM;Both;10 reps;Knee bent    Hip Flexion Limitations counter support    Hip Abduction AROM;Right;Left;1 set;10 reps;Knee straight    Abduction Limitations counter support; limited ROM    Functional Squat 10 reps    Functional Squat Limitations mini squats + cues to increase post WS    Other Standing Knee Exercises alt toe taps onto 6' step 10x each    Other Standing Knee Exercises side steps along each end of the counter 3x with yellow loop around ankles      Knee/Hip Exercises: Seated   Sit to Sand 10 reps;with UE support      Knee/Hip Exercises: Supine   Bridges Strengthening;Both;2 sets;10 reps    Bridges Limitations  to neutral    Other Supine Knee/Hip Exercises ball squeezing 20x5"                      PT Short Term Goals - 09/05/20 1125       PT SHORT TERM GOAL #1   Title Patient will be independent with initial HEP    Time 2    Period Weeks    Status Achieved    Target Date 09/07/20               PT Long Term Goals - 08/28/20 1057       PT LONG TERM GOAL #1   Title Patient will be independent with ongoing/advanced HEP for self-management at home    Time 4    Period Weeks    Status On-going      PT LONG TERM GOAL #2   Title Patient to demonstrate B LE strength >/=4+/5.    Time 4    Period Weeks    Status On-going      PT LONG TERM GOAL #3   Title Patient to complete TUG with LRAD in <14 sec in order to decrease risk of falls.    Time 4    Period Weeks    Status On-going      PT LONG TERM GOAL #4   Title Patient will ambulate with  normal gait pattern with or w/o LRAD    Time 4    Period Weeks    Status On-going      PT LONG TERM GOAL #5   Title Patient to complete 5x STS in <15 sec in order to decrease risk of falls.    Time 4    Period Weeks    Status On-going                   Plan - 09/05/20 1113     Clinical Impression Statement Pt reported no complaints with the R hip today but more aggrevation from her L knee this morning. She demonstrated a good performance of the exercises today but did report mild pain in her L hip with the resisted side steps. She required cues with the mini squats today to increase post WS, preventing knees over toes. She demonstrated increased tolerance for weight bearing exercises today despite the mild c/o pain with side steps. We reviewed her HEP and she denied needing to go over the intial HEP so has met STG 1.  She still verbalizes her precautions and demonstrates a good tolerance for the exercises.    Personal Factors and Comorbidities Age;Comorbidity 3+;Past/Current Experience;Time since onset of injury/illness/exacerbation    Comorbidities GERD, HLD, thyroid disease, lumbar lami/microdiscectomy, TKA, L tibial plateau fx 02/22    PT Frequency 2x / week    PT Duration 4 weeks    PT Treatment/Interventions ADLs/Self Care Home Management;Cryotherapy;Electrical Stimulation;Iontophoresis 80m/ml Dexamethasone;Moist Heat;Balance training;Therapeutic exercise;Therapeutic activities;Functional mobility training;Stair training;Gait training;Ultrasound;Neuromuscular re-education;Patient/family education;Manual techniques;Vasopneumatic Device;Taping;Energy conservation;Dry needling;Passive range of motion;Scar mobilization    PT Next Visit Plan hip FOTO; progress LE strength and mobility    Consulted and Agree with Plan of Care Patient             Patient will benefit from skilled therapeutic intervention in order to improve the following deficits and impairments:  Abnormal gait,  Hypomobility, Increased edema, Decreased activity tolerance, Decreased strength, Increased fascial restricitons, Pain, Decreased balance, Difficulty walking, Increased muscle spasms, Decreased mobility, Improper body mechanics, Decreased range of  motion, Impaired flexibility, Postural dysfunction  Visit Diagnosis: Pain in right hip  Muscle weakness (generalized)  Difficulty in walking, not elsewhere classified     Problem List Patient Active Problem List   Diagnosis Date Noted   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  [place], [place], [address] Phone: [phone]   Fax:  [phone]  Name: Tiffany Byrd MRN: [id number] Date of Birth: [birth year]

## 2020-09-12 ENCOUNTER — Other Ambulatory Visit: Payer: Self-pay

## 2020-09-12 ENCOUNTER — Ambulatory Visit: Payer: Medicare Other | Admitting: Physical Therapy

## 2020-09-12 ENCOUNTER — Encounter: Payer: Self-pay | Admitting: Physical Therapy

## 2020-09-12 DIAGNOSIS — M25551 Pain in right hip: Secondary | ICD-10-CM

## 2020-09-12 DIAGNOSIS — R262 Difficulty in walking, not elsewhere classified: Secondary | ICD-10-CM

## 2020-09-12 DIAGNOSIS — M6281 Muscle weakness (generalized): Secondary | ICD-10-CM

## 2020-09-12 NOTE — Therapy (Signed)
[facility] [address]  [address] [place], [place], [address] Phone: [phone]   Fax:  [phone]  Physical Therapy Treatment  Patient Details  Name: Deanza Upperman MRN: [id number] Date of Birth: September 11, 1940 Referring Provider (PT): Jodi Geralds, MD   Encounter Date: 09/12/2020   PT End of Session - 09/12/20 1059     Visit Number 4    Number of Visits 9    Date for PT Re-Evaluation 09/21/20    Authorization Type UHC Medicare    PT Start Time 1015    PT Stop Time 1059    PT Time Calculation (min) 44 min    Activity Tolerance Patient tolerated treatment well;Patient limited by pain    Behavior During Therapy [facility] for tasks assessed/performed             Past Medical History:  Diagnosis Date   GERD (gastroesophageal reflux disease)    Hyperlipidemia    Thyroid disease     Past Surgical History:  Procedure Laterality Date   ABDOMINAL HYSTERECTOMY     CHOLECYSTECTOMY     LUMBAR LAMINECTOMY/DECOMPRESSION MICRODISCECTOMY     REPLACEMENT TOTAL KNEE      There were no vitals filed for this visit.   Subjective Assessment - 09/12/20 1017     Subjective Doing a little bit better. Has been trying to walk without the walker a little bit in the home. Has been able to do some small standing chores in the kitchen. Feeling a little sore after her post-op appointment since they moved it around a bit. Still having some L knee pain.    Pertinent History GERD, HLD, thyroid disease, lumbar lami/microdiscectomy, TKA, L tibial plateau fx 02/22    Diagnostic tests none recent for hip; 04/23/20 L knee xray: Again noted are findings suspicious for a lateral tibial plateau fracture. Moderate to severe tricompartmental degenerative changes of the patient's left knee.    Patient Stated Goals decrease pain    Currently in Pain? Yes    Pain Score 3     Pain Location Hip   thigh   Pain Orientation Right;Anterior    Pain Descriptors /  Indicators Dull    Pain Type Acute pain    Pain Score 7    Pain Location Knee    Pain Orientation Right;Left    Pain Descriptors / Indicators Aching    Pain Type Chronic pain                               [facility] Adult PT Treatment/Exercise - 09/12/20 0001       Ambulation/Gait   Ambulation Distance (Feet) 90 Feet    Assistive device Straight cane    Gait Pattern Step-through pattern;Decreased weight shift to right;Trunk flexed    Ambulation Surface Level;Indoor    Gait Comments heavy manual and verbal cueing for proper SPC sequencing; advised patient to practice this at home at a counter top      Knee/Hip Exercises: Stretches   Other Knee/Hip Stretches R quad stretch in sitting 2x30"; in standing with walker support 30"   hip in neutral     Knee/Hip Exercises: Aerobic   Nustep L4 x 6 min (UEs/LEs)      Manual Therapy   Manual Therapy Soft tissue mobilization;Myofascial release;Passive ROM    Manual therapy comments supine with LEs elevated    Soft tissue mobilization STM/DTM to R quads  considerable soft tissue restriction and painful trigger pts thorughout; worst distally   Myofascial Release manual TPR to R quads    Passive ROM R mod thomas stretch with hip in neutral 2x30"   good tolerance                   PT Education - 09/12/20 1059     Education Details update to HEP- edu on avoiding hip extension; edu on benefits of moist heat and massage to R quads    Person(s) Educated Patient    Methods Explanation;Demonstration;Tactile cues;Verbal cues;Handout    Comprehension Verbalized understanding;Returned demonstration              PT Short Term Goals - 09/05/20 1125       PT SHORT TERM GOAL #1   Title Patient will be independent with initial HEP    Time 2    Period Weeks    Status Achieved    Target Date 09/07/20               PT Long Term Goals - 08/28/20 1057       PT LONG TERM GOAL #1   Title Patient will be  independent with ongoing/advanced HEP for self-management at home    Time 4    Period Weeks    Status On-going      PT LONG TERM GOAL #2   Title Patient to demonstrate B LE strength >/=4+/5.    Time 4    Period Weeks    Status On-going      PT LONG TERM GOAL #3   Title Patient to complete TUG with LRAD in <14 sec in order to decrease risk of falls.    Time 4    Period Weeks    Status On-going      PT LONG TERM GOAL #4   Title Patient will ambulate with normal gait pattern with or w/o LRAD    Time 4    Period Weeks    Status On-going      PT LONG TERM GOAL #5   Title Patient to complete 5x STS in <15 sec in order to decrease risk of falls.    Time 4    Period Weeks    Status On-going                   Plan - 09/12/20 1100     Clinical Impression Statement Patient arrived to session with report of trying to ambulate without her RW in the home but has not yet tried using SPC. Notes improved ability to perform standing chores for short durations. Patient performed gait training with SPC, requiring heavy manual and verbal cueing for proper SPC sequencing. Advised patient to practice this at home at a counter top for max safety while continuing to use with RW within the home. Patient reported understanding. Patient tolerated MT to address considerable soft tissue restriction and painful trigger pts throughout the R quads. Proceeded with quad stretching while maintaining hip in neutral. Patient noted most benefit from standing stretch, thus updated this into HEP. Patient reported understanding of all edu provided today and without complaints at end of session.    Personal Factors and Comorbidities Age;Comorbidity 3+;Past/Current Experience;Time since onset of injury/illness/exacerbation    Comorbidities GERD, HLD, thyroid disease, lumbar lami/microdiscectomy, TKA, L tibial plateau fx 02/22    PT Frequency 2x / week    PT Duration 4 weeks    PT Treatment/Interventions ADLs/Self  Care Home Management;Cryotherapy;Electrical Stimulation;Iontophoresis 4mg /ml Dexamethasone;Moist Heat;Balance training;Therapeutic exercise;Therapeutic activities;Functional mobility training;Stair training;Gait training;Ultrasound;Neuromuscular re-education;Patient/family education;Manual techniques;Vasopneumatic Device;Taping;Energy conservation;Dry needling;Passive range of motion;Scar mobilization    PT Next Visit Plan progress LE strength and mobility    Consulted and Agree with Plan of Care Patient             Patient will benefit from skilled therapeutic intervention in order to improve the following deficits and impairments:  Abnormal gait, Hypomobility, Increased edema, Decreased activity tolerance, Decreased strength, Increased fascial restricitons, Pain, Decreased balance, Difficulty walking, Increased muscle spasms, Decreased mobility, Improper body mechanics, Decreased range of motion, Impaired flexibility, Postural dysfunction  Visit Diagnosis: Pain in right hip  Muscle weakness (generalized)  Difficulty in walking, not elsewhere classified     Problem List Patient Active Problem List   Diagnosis Date Noted   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   Closed fracture of left tibial plateau 04/09/2020   History of total right knee replacement 03/05/2020   Primary osteoarthritis of right hip 03/05/2020   Primary osteoarthritis of left knee 03/05/2020     05/03/2020, PT, DPT 09/12/20 11:04 AM    [facility] [address]  [address] [place], [place], [place] Phone: [phone]   Fax:  [phone]  Name: Terilynn Buresh MRN: Vinnie Level Date of Birth: Feb 26, 1940

## 2020-09-19 ENCOUNTER — Ambulatory Visit: Payer: Medicare Other | Admitting: Physical Therapy

## 2020-09-19 ENCOUNTER — Other Ambulatory Visit: Payer: Self-pay

## 2020-09-19 ENCOUNTER — Encounter: Payer: Self-pay | Admitting: Physical Therapy

## 2020-09-19 DIAGNOSIS — M25551 Pain in right hip: Secondary | ICD-10-CM | POA: Diagnosis not present

## 2020-09-19 DIAGNOSIS — R262 Difficulty in walking, not elsewhere classified: Secondary | ICD-10-CM

## 2020-09-19 DIAGNOSIS — M6281 Muscle weakness (generalized): Secondary | ICD-10-CM

## 2020-09-19 NOTE — Patient Instructions (Signed)
Access Code: [email or web address] URL: [email or web address] Date: 09/19/2020 Prepared by: Georgina Peer  Exercises Sit to Stand Without Arm Support - 2 x daily - 7 x weekly - 2 sets - 5 reps Mini Squat with Counter Support - 2 x daily - 7 x weekly - 2 sets - 10 reps Quadricep Stretch with Chair and Counter Support - 2 x daily - 7 x weekly - 2 sets - 30 sec hold Side Stepping with Resistance at Ankles and Counter Support - 2 x daily - 7 x weekly - 2 sets - 10 reps Terminal Knee Extension with Ball and Counter - 2 x daily - 7 x weekly - 2 sets - 10 reps - 5 sec hold

## 2020-09-19 NOTE — Therapy (Addendum)
[facility] [address]  [place], [place], [address] Phone: [phone]   Fax:  [phone]  Physical Therapy Treatment  Patient Details  Name: Ledonna Dormer MRN: [id number] Date of Birth: Aug 02, 1940 Referring Provider (PT): Dorna Leitz, MD   Progress Note Reporting Period 08/24/20 to 09/19/20  See note below for Objective Data and Assessment of Progress/Goals.    Encounter Date: 09/19/2020   PT End of Session - 09/19/20 1157     Visit Number 5    Number of Visits 10    Date for PT Re-Evaluation 10/24/20    Authorization Type UHC Medicare    PT Start Time 1006    PT Stop Time 1057    PT Time Calculation (min) 51 min    Activity Tolerance Patient tolerated treatment well;Patient limited by pain    Behavior During Therapy [facility] for tasks assessed/performed             Past Medical History:  Diagnosis Date   GERD (gastroesophageal reflux disease)    Hyperlipidemia    Thyroid disease     Past Surgical History:  Procedure Laterality Date   ABDOMINAL HYSTERECTOMY     CHOLECYSTECTOMY     LUMBAR LAMINECTOMY/DECOMPRESSION MICRODISCECTOMY     REPLACEMENT TOTAL KNEE      There were no vitals filed for this visit.   Subjective Assessment - 09/19/20 1007     Subjective Feels like she is getting more coordinated with her SPC. Bringing it in for adjustment today, No pain in the hip today. Reports 100% improvement since starting therapy. Able to transfer in/out of bed, shower, and dress. Husband has been helping her don shoes.    Pertinent History GERD, HLD, thyroid disease, lumbar lami/microdiscectomy, TKA, L tibial plateau fx 02/22    Diagnostic tests none recent for hip; 04/23/20 L knee xray: Again noted are findings suspicious for a lateral tibial plateau fracture. Moderate to severe tricompartmental degenerative changes of the patient's left knee.    Patient Stated Goals decrease pain    Currently in  Pain? Yes    Pain Score 3    Pain Location Knee    Pain Orientation Right;Left    Pain Descriptors / Indicators Aching    Pain Type Chronic pain                [facility] PT Assessment - 09/19/20 1009       Assessment   Medical Diagnosis s/p R anterior THA    Referring Provider (PT) Dorna Leitz, MD    Onset Date/Surgical Date 08/22/20      Observation/Other Assessments   Focus on Therapeutic Outcomes (FOTO)  Hip: 53      Strength   Right Hip Flexion 4/5    Right Hip ABduction 4/5    Right Hip ADduction 4/5    Left Hip Flexion 4/5    Left Hip ABduction 4+/5    Left Hip ADduction 4+/5    Right Knee Flexion 4+/5    Right Knee Extension 4+/5    Left Knee Flexion 4/5    Left Knee Extension 4/5    Right Ankle Dorsiflexion 4+/5    Right Ankle Plantar Flexion 4+/5    Left Ankle Dorsiflexion 4+/5    Left Ankle Plantar Flexion 4+/5      Standardized Balance Assessment   Five times sit to stand comments  23.34 sec   with B armrests     Timed Up and Go  Test   Normal TUG (seconds) --   20.25 sec with SPC                          [facility] Adult PT Treatment/Exercise - 09/19/20 0001       Ambulation/Gait   Ambulation Distance (Feet) 90 Feet    Assistive device Straight cane    Gait Pattern Step-through pattern;Poor foot clearance - right;Poor foot clearance - left;Decreased step length - right;Decreased step length - left    Ambulation Surface Level;Indoor    Gait Comments much improved SPC sequencing and stability      Knee/Hip Exercises: Aerobic   Nustep L5 x 6 min (UEs/LEs)      Knee/Hip Exercises: Standing   Terminal Knee Extension Strengthening;Left;5 reps    Terminal Knee Extension Limitations 5x5" with towel roll and UE support    Other Standing Knee Exercises side steps along each end of the counter 2x with yellow loop around ankles      Knee/Hip Exercises: Seated   Sit to Sand 2 sets;5 reps;with UE support   sitting on airex                    PT Education - 09/19/20 1156     Education Details update to HEP; discussion on objective progress and remaining impairments; gave patient clearance to use SPC when inside/outside d/t improved SPC sequencing and stability    Person(s) Educated Patient    Methods Explanation;Tactile cues;Demonstration;Verbal cues;Handout    Comprehension Verbalized understanding;Returned demonstration              PT Short Term Goals - 09/19/20 1203       PT SHORT TERM GOAL #1   Title Patient will be independent with initial HEP    Time 2    Period Weeks    Status Achieved    Target Date 09/07/20               PT Long Term Goals - 09/19/20 1203       PT LONG TERM GOAL #1   Title Patient will be independent with ongoing/advanced HEP for self-management at home    Time 5    Period Weeks    Status Partially Met   met for current   Target Date 10/24/20      PT LONG TERM GOAL #2   Title Patient to demonstrate B LE strength >/=4+/5.    Time 5    Period Weeks    Status Partially Met   R hip and L knee weakness evident   Target Date 10/24/20      PT LONG TERM GOAL #3   Title Patient to complete TUG with LRAD in <14 sec in order to decrease risk of falls.    Time 5    Period Weeks    Status On-going    Target Date 10/24/20      PT LONG TERM GOAL #4   Title Patient will ambulate with normal gait pattern with or w/o LRAD    Time 5    Period Weeks    Status Partially Met   minor deviations still present   Target Date 10/24/20      PT LONG TERM GOAL #5   Title Patient to complete 5x STS in <15 sec in order to decrease risk of falls.    Time 5    Period Weeks    Status On-going  Target Date 10/24/20                   Plan - 09/19/20 1158     Clinical Impression Statement Patient arrived to session with report of 100% improvement since starting therapy. Notes that she is now able to transfer in/out of bed and shower and is able to dress herself.  Notes that her husband still helps her don shoes. Adjusted patient's SPC to proper height and proceeded with gait training- patient was able to demonstrate much improved SPC sequencing and stability, thus encouraged patient to use SPC when inside/outside d/t improved safety with this AD. Strength testing revealed excellent improvement in B LEs, with remaining weakness evident in R hip and L knee. TUG time has improved since last measured despite using SPC vs. RW today. 5xSTS speed also improved today, however patient still requires consistent UE use d/t B LE knee pain/weakness. Despite patient's improvement, she still demonstrates decreased gait speed and increased time for transfers, indicating and increased risk of falls. Patient was educated on HEP to further address remaining deficits- she reported understanding. Patient is demonstrating good progress and would benefit from additional skilled PT services 1x/week ro 5 weeks to address remaining impairments.    Personal Factors and Comorbidities Age;Comorbidity 3+;Past/Current Experience;Time since onset of injury/illness/exacerbation    Comorbidities GERD, HLD, thyroid disease, lumbar lami/microdiscectomy, TKA, L tibial plateau fx 02/22    PT Frequency 1x / week    PT Duration Other (comment)   5 weeks   PT Treatment/Interventions ADLs/Self Care Home Management;Cryotherapy;Electrical Stimulation;Iontophoresis 51m/ml Dexamethasone;Moist Heat;Balance training;Therapeutic exercise;Therapeutic activities;Functional mobility training;Stair training;Gait training;Ultrasound;Neuromuscular re-education;Patient/family education;Manual techniques;Vasopneumatic Device;Taping;Energy conservation;Dry needling;Passive range of motion;Scar mobilization    PT Next Visit Plan improve STS transfers, gait speed; strengthen R hip and L knee    Consulted and Agree with Plan of Care Patient             Patient will benefit from skilled therapeutic intervention in order  to improve the following deficits and impairments:  Abnormal gait, Hypomobility, Increased edema, Decreased activity tolerance, Decreased strength, Increased fascial restricitons, Pain, Decreased balance, Difficulty walking, Increased muscle spasms, Decreased mobility, Improper body mechanics, Decreased range of motion, Impaired flexibility, Postural dysfunction  Visit Diagnosis: Pain in right hip  Muscle weakness (generalized)  Difficulty in walking, not elsewhere classified     Problem List Patient Active Problem List   Diagnosis Date Noted   Monoclonal immunoglobulin [place] disease [place] 05/29/2020   Closed fracture of left tibial plateau 04/09/2020   History of total right knee replacement 03/05/2020   Primary osteoarthritis of right hip 03/05/2020   Primary osteoarthritis of left knee 03/05/2020     YJanene Harvey PT, DPT 09/19/20 12:06 PM    [facility] [address] [place] [address]Phone: [phone]  Fax:  [phone] Name: SKathelyn GombosMRN: [id number] of Birth: 103-08-1940  PHYSICAL THERAPY DISCHARGE SUMMARY  Visits from Start of Care: 5  Current functional level related to goals / functional outcomes: See above clinical impression; patient did not return, citing other health problems    Remaining deficits: LE weakness, decreased gait and transfer speed   Education / Equipment: HEP  Plan: Patient agrees to discharge.  Patient goals were partially met. Patient is being discharged due to her request.     YJanene Harvey PT, DPT 10/11/20 1:05 PM

## 2021-04-30 DIAGNOSIS — N1831 Chronic kidney disease, stage 3a: Secondary | ICD-10-CM | POA: Insufficient documentation

## 2021-07-03 ENCOUNTER — Ambulatory Visit: Payer: Self-pay

## 2021-07-03 ENCOUNTER — Ambulatory Visit (INDEPENDENT_AMBULATORY_CARE_PROVIDER_SITE_OTHER): Payer: Medicare Other | Admitting: Family Medicine

## 2021-07-03 ENCOUNTER — Telehealth (HOSPITAL_BASED_OUTPATIENT_CLINIC_OR_DEPARTMENT_OTHER): Payer: Self-pay

## 2021-07-03 ENCOUNTER — Encounter: Payer: Self-pay | Admitting: Family Medicine

## 2021-07-03 VITALS — BP 120/78 | Ht 61.0 in | Wt 164.0 lb

## 2021-07-03 DIAGNOSIS — M1712 Unilateral primary osteoarthritis, left knee: Secondary | ICD-10-CM | POA: Diagnosis not present

## 2021-07-03 DIAGNOSIS — M5416 Radiculopathy, lumbar region: Secondary | ICD-10-CM | POA: Insufficient documentation

## 2021-07-03 DIAGNOSIS — M47817 Spondylosis without myelopathy or radiculopathy, lumbosacral region: Secondary | ICD-10-CM | POA: Diagnosis not present

## 2021-07-03 NOTE — Assessment & Plan Note (Signed)
Acute on chronic in nature.  Has tried steroid injections as well as gel injections in the knee with limited improvement. ?-Counseled on home exercise therapy and supportive care. ?-Zilretta injection today. ?-Could consider iovera. ?

## 2021-07-03 NOTE — Assessment & Plan Note (Signed)
Acute on chronic in nature.  Having multiple levels of facet arthropathy that could be contributing to the back pain. ?-Counseled on home exercise therapy and supportive care. ?-MRI of the lumbar spine to evaluate for lumbar arthropathy for consideration of facet injections versus medial branch blocks. ?

## 2021-07-03 NOTE — Patient Instructions (Addendum)
Good to see you  ?Please use ice on your knee as needed. ?We will get the MRI here at the [facility] ?Please send me a message in MyChart with any questions or updates.  ?We will setup a virtual visit once the MRI is resulted.  ? ?--Dr. Raeford Razor ? ? ?

## 2021-07-03 NOTE — Assessment & Plan Note (Signed)
Acute on chronic in nature.  Having radicular pain down the left leg.  She takes Lyrica and Cymbalta.  Previous x-rays and MRI demonstrate degenerative changes as well as nerve impingement.  Acutely getting worse. ?-Counseled on home exercise therapy and supportive care. ?-MRI of the lumbar spine to evaluate for nerve impingement and for epidural use. ?

## 2021-07-03 NOTE — Progress Notes (Signed)
?Tiffany Byrd - 81 y.o. female MRN [id number]  Date of birth: [birth year] ? ?SUBJECTIVE:  Including CC & ROS.  ?No chief complaint on file. ? ? ?Tiffany Byrd is a 81 y.o. female that is presenting with acute on chronic left knee pain and acute on chronic back pain with left-sided radicular pain.  She reports the knee pain has been bothering her over the past few months.  The back pain is acute on chronic.  Has a history of bone spurs being taken off of her back.  She is still having pain in the back as well as down the left leg. ? ?Review of the lumbar spine x-ray from 2021 shows levoscoliosis with apex at L1-2, an 8 mm left lateral listhesis and a L4-5 3 mm right lateral listhesis with disc degenerative changes and facet arthropathy in the lumbar spine. 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 ?Review of Systems ?See HPI  ? ?HISTORY: Past Medical, Surgical, Social, and Family History Reviewed & Updated per EMR.   ?Pertinent Historical Findings include: ? ?Past Medical History:  ?Diagnosis Date  ? GERD (gastroesophageal reflux disease)   ? Hyperlipidemia   ? Thyroid disease   ? ? ?Past Surgical History:  ?Procedure Laterality Date  ? ABDOMINAL HYSTERECTOMY    ? CHOLECYSTECTOMY    ? LUMBAR LAMINECTOMY/DECOMPRESSION MICRODISCECTOMY    ? REPLACEMENT TOTAL KNEE    ? ? ? ?PHYSICAL EXAM:  ?VS: BP 120/78 (BP Location: Left Arm, Patient Position: Sitting)   Ht 5\' 1"  (1.549 m)   Wt 164 lb (74.4 kg)   BMI 30.99 kg/m?  ?Physical Exam ?Gen: NAD, alert, cooperative with exam, well-appearing ?MSK:  ?Neurovascularly intact   ? ? ?Aspiration/Injection Procedure Note ?Tiffany Byrd ?December 28, 1940 ? ?Procedure: Aspiration and injection ?Indications: left knee pain  ? ?Procedure Details ?Consent: Risks  of procedure as well as the alternatives and risks of each were explained to the (patient/caregiver).  Consent for procedure obtained. ?Time Out: Verified patient identification, verified procedure, site/side was marked, verified correct patient position, special equipment/implants available, medications/allergies/relevent history reviewed, required imaging and test results available.  Performed.  The area was cleaned with iodine and alcohol swabs.   ? ?The left knee superior lateral suprapatellar pouch was injected using 3 cc of 1% lidocaine on a 25-gauge 1-1/2 inch needle.  An 18-gauge 1-1/2 needle was used to achieve aspiration.  The syringe was switched and a mixture containing 5 cc's of 32 mg Zilretta and 4 cc's of 0.25% bupivacaine was injected.  Ultrasound was used. Images were obtained in long views showing the injection.  ? ?Amount of Fluid Aspirated: 27mL ?Character of Fluid: clear and straw colored ?Fluid was sent for: n/a ? ?A sterile dressing was applied. ? ?Patient did tolerate procedure well. ? ?  ? ? ? ?ASSESSMENT & PLAN:  ? ?Lumbar radiculopathy ?Acute on chronic in nature.  Having radicular pain down the left leg.  She takes Lyrica and Cymbalta.  Previous x-rays and MRI demonstrate degenerative changes as well as nerve impingement.  Acutely getting worse. ?-Counseled on home exercise therapy and supportive care. ?-MRI of the lumbar spine to evaluate for nerve impingement and for epidural use. ? ?Facet arthropathy, lumbosacral ?Acute on chronic in nature.  Having multiple levels  of facet arthropathy that could be contributing to the back pain. ?-Counseled on home exercise therapy and supportive care. ?-MRI of the lumbar spine to evaluate for lumbar arthropathy for consideration of facet injections versus medial branch blocks. ? ?Primary osteoarthritis of left knee ?Acute on chronic in nature.  Has tried steroid injections as well as gel injections in the knee with limited improvement. ?-Counseled on  home exercise therapy and supportive care. ?-Zilretta injection today. ?-Could consider iovera. ? ? ? ? ?

## 2021-07-04 ENCOUNTER — Telehealth (HOSPITAL_BASED_OUTPATIENT_CLINIC_OR_DEPARTMENT_OTHER): Payer: Self-pay

## 2021-07-09 ENCOUNTER — Telehealth (HOSPITAL_BASED_OUTPATIENT_CLINIC_OR_DEPARTMENT_OTHER): Payer: Self-pay

## 2021-07-13 ENCOUNTER — Other Ambulatory Visit: Payer: Self-pay

## 2021-07-13 ENCOUNTER — Emergency Department (HOSPITAL_BASED_OUTPATIENT_CLINIC_OR_DEPARTMENT_OTHER): Payer: Medicare Other

## 2021-07-13 ENCOUNTER — Encounter (HOSPITAL_BASED_OUTPATIENT_CLINIC_OR_DEPARTMENT_OTHER): Payer: Self-pay | Admitting: Emergency Medicine

## 2021-07-13 ENCOUNTER — Observation Stay (HOSPITAL_BASED_OUTPATIENT_CLINIC_OR_DEPARTMENT_OTHER)
Admission: EM | Admit: 2021-07-13 | Discharge: 2021-07-15 | Disposition: A | Discharge status: Home / Self Care | Payer: Medicare Other | Attending: Internal Medicine | Admitting: Internal Medicine

## 2021-07-13 DIAGNOSIS — E669 Obesity, unspecified: Secondary | ICD-10-CM | POA: Diagnosis not present

## 2021-07-13 DIAGNOSIS — Z7902 Long term (current) use of antithrombotics/antiplatelets: Secondary | ICD-10-CM | POA: Insufficient documentation

## 2021-07-13 DIAGNOSIS — R202 Paresthesia of skin: Secondary | ICD-10-CM | POA: Diagnosis present

## 2021-07-13 DIAGNOSIS — Z6832 Body mass index (BMI) 32.0-32.9, adult: Secondary | ICD-10-CM | POA: Diagnosis not present

## 2021-07-13 DIAGNOSIS — I639 Cerebral infarction, unspecified: Secondary | ICD-10-CM | POA: Diagnosis not present

## 2021-07-13 DIAGNOSIS — E039 Hypothyroidism, unspecified: Secondary | ICD-10-CM | POA: Insufficient documentation

## 2021-07-13 DIAGNOSIS — Z7982 Long term (current) use of aspirin: Secondary | ICD-10-CM | POA: Diagnosis not present

## 2021-07-13 DIAGNOSIS — Z7901 Long term (current) use of anticoagulants: Secondary | ICD-10-CM | POA: Diagnosis not present

## 2021-07-13 DIAGNOSIS — Z91040 Latex allergy status: Secondary | ICD-10-CM | POA: Insufficient documentation

## 2021-07-13 DIAGNOSIS — R270 Ataxia, unspecified: Secondary | ICD-10-CM | POA: Insufficient documentation

## 2021-07-13 DIAGNOSIS — Z79899 Other long term (current) drug therapy: Secondary | ICD-10-CM | POA: Diagnosis not present

## 2021-07-13 DIAGNOSIS — I10 Essential (primary) hypertension: Secondary | ICD-10-CM | POA: Diagnosis not present

## 2021-07-13 DIAGNOSIS — Z96659 Presence of unspecified artificial knee joint: Secondary | ICD-10-CM | POA: Insufficient documentation

## 2021-07-13 DIAGNOSIS — K219 Gastro-esophageal reflux disease without esophagitis: Secondary | ICD-10-CM | POA: Diagnosis present

## 2021-07-13 DIAGNOSIS — E782 Mixed hyperlipidemia: Secondary | ICD-10-CM | POA: Diagnosis present

## 2021-07-13 LAB — COMPREHENSIVE METABOLIC PANEL
ALT: 18 U/L (ref 0–44)
AST: 19 U/L (ref 15–41)
Albumin: 3.9 g/dL (ref 3.5–5.0)
Alkaline Phosphatase: 94 U/L (ref 38–126)
Anion gap: 8 (ref 5–15)
BUN: 22 mg/dL (ref 8–23)
CO2: 26 mmol/L (ref 22–32)
Calcium: 9.3 mg/dL (ref 8.9–10.3)
Chloride: 102 mmol/L (ref 98–111)
Creatinine, Ser: 1.1 mg/dL — ABNORMAL HIGH (ref 0.44–1.00)
GFR, Estimated: 50 mL/min — ABNORMAL LOW (ref >60)
Glucose, Bld: 132 mg/dL — ABNORMAL HIGH (ref 70–99)
Potassium: 3.6 mmol/L (ref 3.5–5.1)
Sodium: 136 mmol/L (ref 135–145)
Total Bilirubin: 0.4 mg/dL (ref 0.3–1.2)
Total Protein: 7.9 g/dL (ref 6.5–8.1)

## 2021-07-13 LAB — CBC WITH DIFFERENTIAL/PLATELET
Abs Immature Granulocytes: 0.07 10*3/uL (ref 0.00–0.07)
Basophils Absolute: 0.1 10*3/uL (ref 0.0–0.1)
Basophils Relative: 1 %
Eosinophils Absolute: 0.2 10*3/uL (ref 0.0–0.5)
Eosinophils Relative: 2 %
HCT: 38.6 % (ref 36.0–46.0)
Hemoglobin: 12.7 g/dL (ref 12.0–15.0)
Immature Granulocytes: 1 %
Lymphocytes Relative: 15 %
Lymphs Abs: 2.1 10*3/uL (ref 0.7–4.0)
MCH: 28.9 pg (ref 26.0–34.0)
MCHC: 32.9 g/dL (ref 30.0–36.0)
MCV: 87.7 fL (ref 80.0–100.0)
Monocytes Absolute: 1.3 10*3/uL — ABNORMAL HIGH (ref 0.1–1.0)
Monocytes Relative: 9 %
Neutro Abs: 9.8 10*3/uL — ABNORMAL HIGH (ref 1.7–7.7)
Neutrophils Relative %: 72 %
Platelets: 442 10*3/uL — ABNORMAL HIGH (ref 150–400)
RBC: 4.4 MIL/uL (ref 3.87–5.11)
RDW: 14.3 % (ref 11.5–15.5)
WBC: 13.5 10*3/uL — ABNORMAL HIGH (ref 4.0–10.5)
nRBC: 0 % (ref 0.0–0.2)

## 2021-07-13 LAB — TROPONIN I (HIGH SENSITIVITY): Troponin I (High Sensitivity): 3 ng/L (ref <18)

## 2021-07-13 MED ORDER — IOHEXOL 350 MG/ML SOLN
100.0000 mL | Freq: Once | INTRAVENOUS | Status: AC | PRN
  Administered 2021-07-13: 100 mL via INTRAVENOUS

## 2021-07-13 NOTE — ED Provider Notes (Incomplete)
[facility] EMERGENCY DEPARTMENT Provider Note   CSN: [id number] Arrival date & time: 07/13/21  2004     History {Add pertinent medical, surgical, social history, OB history to HPI:1} Chief Complaint  Patient presents with  . Numbness    Tiffany Byrd is a 81 y.o. female.  HPI     81 year old female with a history of hyperlipidemia, thyroid disease, presents with concern for difficulty using her left arm.  Reports that the symptoms began suddenly at 11:30 AM today.  Reports she was having her breakfast and was in a normal state of health, when suddenly she had difficulty using the bottom part of her left arm.  Reports she felt like a heaviness and weakness in the lower part of her arm, but did not note any weakness in the upper part of her arm.  Denies leg weakness, numbness, facial droop, change in vision, speech problems, difficulty walking.  Reports she had difficulty doing things like picking up small objects, folding close.  She did feel somewhat off balance or slightly off while she was walking. No dizziness. No chest pain or dyspnea. No black or bloody stools.   Past Medical History:  Diagnosis Date  . GERD (gastroesophageal reflux disease)   . Hyperlipidemia   . Thyroid disease     Past Surgical History:  Procedure Laterality Date  . ABDOMINAL HYSTERECTOMY    . CHOLECYSTECTOMY    . LUMBAR LAMINECTOMY/DECOMPRESSION MICRODISCECTOMY    . REPLACEMENT TOTAL KNEE      Home Medications Prior to Admission medications   Medication Sig Start Date End Date Taking? Authorizing Provider  celecoxib (CELEBREX) 200 MG capsule One to 2 tablets by mouth daily as needed for pain. Patient not taking: Reported on 08/24/2020 03/05/20   Rosemarie Ax, MD  cholecalciferol (VITAMIN D) 400 units TABS tablet Take 400 Units by mouth.    [provider]  Cyclobenzaprine HCl (FLEXERIL PO) Take by mouth.    [provider]  DULoxetine (CYMBALTA) 30 MG capsule Take 30  mg by mouth at bedtime. 03/30/20   [provider]  HYDROcodone-acetaminophen (NORCO/VICODIN) 5-325 MG tablet Take 1 tablet by mouth every 8 (eight) hours as needed. 05/28/20   Rosemarie Ax, MD  levothyroxine (SYNTHROID, LEVOTHROID) 50 MCG tablet Take 50 mcg by mouth daily before breakfast.    [provider]  metoCLOPramide (REGLAN) 5 MG tablet Take 5 mg by mouth 4 (four) times daily.    [provider]  Multiple Vitamin (MULTIVITAMIN) LIQD Take 5 mLs by mouth daily.    [provider]  Omeprazole (PRILOSEC PO) Take by mouth.    [provider]  Ondansetron HCl (ZOFRAN PO) Take by mouth.    [provider]  PRAVASTATIN SODIUM PO Take by mouth.    [provider]  triamterene-hydrochlorothiazide (DYAZIDE) 37.5-25 MG capsule Take 1 capsule by mouth daily.    [provider]      Allergies    Latex, Penicillins, and Hydrocodone-acetaminophen    Review of Systems   Review of Systems  Physical Exam Updated Vital Signs BP (!) 149/80   Pulse (!) 52   Temp 98 F (36.7 C) (Oral)   Resp 20   Wt 78.5 kg   SpO2 100%   BMI 32.70 kg/m  Physical Exam Vitals and nursing note reviewed.  Constitutional:      General: She is not in acute distress.    Appearance: Normal appearance. She is well-developed. She is not ill-appearing  or diaphoretic.  HENT:     Head: Normocephalic and atraumatic.  Eyes:     General: No visual field deficit.    Extraocular Movements: Extraocular movements intact.     Conjunctiva/sclera: Conjunctivae normal.     Pupils: Pupils are equal, round, and reactive to light.  Cardiovascular:     Rate and Rhythm: Normal rate and regular rhythm.     Pulses: Normal pulses.     Heart sounds: Normal heart sounds. No murmur heard.   No friction rub. No gallop.  Pulmonary:     Effort: Pulmonary effort is normal. No respiratory distress.     Breath sounds: Normal breath sounds. No wheezing or rales.   Abdominal:     General: There is no distension.     Palpations: Abdomen is soft.     Tenderness: There is no abdominal tenderness. There is no guarding.  Musculoskeletal:        General: No swelling or tenderness.     Cervical back: Normal range of motion.  Skin:    General: Skin is warm and dry.     Findings: No erythema or rash.  Neurological:     General: No focal deficit present.     Mental Status: She is alert and oriented to person, place, and time.     GCS: GCS eye subscore is 4. GCS verbal subscore is 5. GCS motor subscore is 6.     Cranial Nerves: No cranial nerve deficit, dysarthria or facial asymmetry.     Sensory: Sensory deficit (left lower arm altered sensation) present.     Motor: Tremor present. No weakness.     Coordination: Coordination abnormal (lue). Finger-Nose-Finger Test abnormal (LUE).     Gait: Gait normal.    ED Results / Procedures / Treatments   Labs (all labs ordered are listed, but only abnormal results are displayed) Labs Reviewed  CBC WITH DIFFERENTIAL/PLATELET - Abnormal; Notable for the following components:      Result Value   WBC 13.5 (*)    Platelets 442 (*)    Neutro Abs 9.8 (*)    Monocytes Absolute 1.3 (*)    All other components within normal limits  COMPREHENSIVE METABOLIC PANEL - Abnormal; Notable for the following components:   Glucose, Bld 132 (*)    Creatinine, Ser 1.10 (*)    GFR, Estimated 50 (*)    All other components within normal limits  TROPONIN I (HIGH SENSITIVITY)  TROPONIN I (HIGH SENSITIVITY)    EKG EKG Interpretation  Date/Time:  Saturday Jul 13 2021 20:54:30 EDT Ventricular Rate:  52 PR Interval:  144 QRS Duration: 86 QT Interval:  434 QTC Calculation: 404 R Axis:   36 Text Interpretation: Sinus rhythm No previous ECGs available Confirmed by Gareth Morgan [phone]) on 07/13/2021 9:29:44 PM  Radiology No results found.  Procedures Procedures  {Document cardiac monitor, telemetry assessment procedure  when appropriate:1}  Medications Ordered in ED Medications  iohexol (OMNIPAQUE) 350 MG/ML injection 100 mL (100 mLs Intravenous Contrast Given 07/13/21 2326)    ED Course/ Medical Decision Making/ A&P                           Medical Decision Making Amount and/or Complexity of Data Reviewed Labs: ordered. Radiology: ordered.  Risk Prescription drug management.    81 year old female with a history of hyperlipidemia, thyroid disease, presents with concern for difficulty using her left arm.  DDx includes ICH, CVA.  Doubt  cervical pathology given exam more significant for coordination difficulties than significant weakness, no neck pain.  Labs personally evaluated and interpreted by me show no sign of significant anemia, electrolyte abnormalities.  EKG shows a normal sinus rhythm without other acute abnormalities was personally evaluated interpreted by me.  Troponin was within normal limits and have low suspicion for ACS.  Clinically have concern for acute CVA.  She is out of the tenecteplase window, and is VAN negative. Discussed with Dr. Rory Percy Neurology.   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

## 2021-07-13 NOTE — ED Notes (Signed)
Attempted x 2 to obtain blood for labs

## 2021-07-13 NOTE — ED Provider Notes (Signed)
[facility] EMERGENCY DEPARTMENT Provider Note   CSN: [id number] Arrival date & time: 07/13/21  2004     History  Chief Complaint  Patient presents with   Numbness    Tiffany Byrd is a 81 y.o. female.  HPI     81 year old female with a history of hyperlipidemia, thyroid disease, presents with concern for difficulty using her left arm.  Reports that the symptoms began suddenly at 11:30 AM today.  Reports she was having her breakfast and was in a normal state of health, when suddenly she had difficulty using the bottom part of her left arm.  Reports she felt like a heaviness and weakness in the lower part of her arm, but did not note any weakness in the upper part of her arm.  Denies leg weakness, numbness, facial droop, change in vision, speech problems, difficulty walking.  Reports she had difficulty doing things like picking up small objects, folding close.  She did feel somewhat off balance or slightly off while she was walking. No dizziness. No chest pain or dyspnea. No black or bloody stools.   Past Medical History:  Diagnosis Date   GERD (gastroesophageal reflux disease)    Hyperlipidemia    Thyroid disease     Past Surgical History:  Procedure Laterality Date   ABDOMINAL HYSTERECTOMY     CHOLECYSTECTOMY     LUMBAR LAMINECTOMY/DECOMPRESSION MICRODISCECTOMY     REPLACEMENT TOTAL KNEE      Home Medications Prior to Admission medications   Medication Sig Start Date End Date Taking? Authorizing Provider  celecoxib (CELEBREX) 200 MG capsule One to 2 tablets by mouth daily as needed for pain. Patient not taking: Reported on 08/24/2020 03/05/20   Myra Rude, MD  cholecalciferol (VITAMIN D) 400 units TABS tablet Take 400 Units by mouth.    [provider]  Cyclobenzaprine HCl (FLEXERIL PO) Take by mouth.    [provider]  DULoxetine (CYMBALTA) 30 MG capsule Take 30 mg by mouth at bedtime. 03/30/20   [provider]   HYDROcodone-acetaminophen (NORCO/VICODIN) 5-325 MG tablet Take 1 tablet by mouth every 8 (eight) hours as needed. 05/28/20   Myra Rude, MD  levothyroxine (SYNTHROID, LEVOTHROID) 50 MCG tablet Take 50 mcg by mouth daily before breakfast.    [provider]  metoCLOPramide (REGLAN) 5 MG tablet Take 5 mg by mouth 4 (four) times daily.    [provider]  Multiple Vitamin (MULTIVITAMIN) LIQD Take 5 mLs by mouth daily.    [provider]  Omeprazole (PRILOSEC PO) Take by mouth.    [provider]  Ondansetron HCl (ZOFRAN PO) Take by mouth.    [provider]  PRAVASTATIN SODIUM PO Take by mouth.    [provider]  triamterene-hydrochlorothiazide (DYAZIDE) 37.5-25 MG capsule Take 1 capsule by mouth daily.    [provider]      Allergies    Latex, Penicillins, and Hydrocodone-acetaminophen    Review of Systems   Review of Systems  Physical Exam Updated Vital Signs BP (!) 149/80   Pulse (!) 52   Temp 98 F (36.7 C) (Oral)   Resp 20   Wt 78.5 kg   SpO2 100%   BMI 32.70 kg/m  Physical Exam Vitals and nursing note reviewed.  Constitutional:      General: She is not in acute distress.    Appearance: Normal appearance. She is well-developed. She is not ill-appearing or diaphoretic.  HENT:     Head:  Normocephalic and atraumatic.  Eyes:     General: No visual field deficit.    Extraocular Movements: Extraocular movements intact.     Conjunctiva/sclera: Conjunctivae normal.     Pupils: Pupils are equal, round, and reactive to light.  Cardiovascular:     Rate and Rhythm: Normal rate and regular rhythm.     Pulses: Normal pulses.     Heart sounds: Normal heart sounds. No murmur heard.   No friction rub. No gallop.  Pulmonary:     Effort: Pulmonary effort is normal. No respiratory distress.     Breath sounds: Normal breath sounds. No wheezing or rales.  Abdominal:     General: There is no distension.      Palpations: Abdomen is soft.     Tenderness: There is no abdominal tenderness. There is no guarding.  Musculoskeletal:        General: No swelling or tenderness.     Cervical back: Normal range of motion.  Skin:    General: Skin is warm and dry.     Findings: No erythema or rash.  Neurological:     General: No focal deficit present.     Mental Status: She is alert and oriented to person, place, and time.     GCS: GCS eye subscore is 4. GCS verbal subscore is 5. GCS motor subscore is 6.     Cranial Nerves: No cranial nerve deficit, dysarthria or facial asymmetry.     Sensory: Sensory deficit (left lower arm altered sensation) present.     Motor: Tremor present. No weakness.     Coordination: Coordination abnormal (lue). Finger-Nose-Finger Test abnormal (LUE).     Gait: Gait normal.    ED Results / Procedures / Treatments   Labs (all labs ordered are listed, but only abnormal results are displayed) Labs Reviewed  CBC WITH DIFFERENTIAL/PLATELET - Abnormal; Notable for the following components:      Result Value   WBC 13.5 (*)    Platelets 442 (*)    Neutro Abs 9.8 (*)    Monocytes Absolute 1.3 (*)    All other components within normal limits  COMPREHENSIVE METABOLIC PANEL - Abnormal; Notable for the following components:   Glucose, Bld 132 (*)    Creatinine, Ser 1.10 (*)    GFR, Estimated 50 (*)    All other components within normal limits  TROPONIN I (HIGH SENSITIVITY)  TROPONIN I (HIGH SENSITIVITY)    EKG EKG Interpretation  Date/Time:  Saturday Jul 13 2021 20:54:30 EDT Ventricular Rate:  52 PR Interval:  144 QRS Duration: 86 QT Interval:  434 QTC Calculation: 404 R Axis:   36 Text Interpretation: Sinus rhythm No previous ECGs available Confirmed by Alvira Monday ([id number]) on 07/13/2021 9:29:44 PM  Radiology No results found.  Procedures Procedures    Medications Ordered in ED Medications  iohexol (OMNIPAQUE) 350 MG/ML injection 100 mL (100 mLs Intravenous  Contrast Given 07/13/21 2326)    ED Course/ Medical Decision Making/ A&P                           Medical Decision Making Amount and/or Complexity of Data Reviewed Labs: ordered. Radiology: ordered.  Risk Prescription drug management.    81 year old female with a history of hyperlipidemia, thyroid disease, presents with concern for difficulty using her left arm.  DDx includes ICH, CVA.  Doubt cervical pathology given exam more significant for coordination difficulties than significant weakness, no neck pain.  Labs personally evaluated and interpreted by me show no sign of significant anemia, electrolyte abnormalities.  EKG shows a normal sinus rhythm without other acute abnormalities was personally evaluated interpreted by me.  Troponin was within normal limits and have low suspicion for ACS.  Clinically have concern for acute CVA.  She is out of the tenecteplase window, and is VAN negative. Discussed with Dr. Wilford CornerArora Neurology.  Will order CTA.  CTA evaluated by me and radiology without acute ICH, or occlusion-does show mild narrowing of right vertebral artery.  [facility]        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

## 2021-07-13 NOTE — ED Triage Notes (Signed)
"  Heaviness" to L hand since 11:30 this morning. States she has had trouble holding things. Speech clear, equal grips.

## 2021-07-13 NOTE — ED Notes (Signed)
Called [facility] at 23:55 for tranfer to [facility] ED - Stroke - MRI.  Accepting is Dr. Juanita Craver.

## 2021-07-14 ENCOUNTER — Observation Stay (HOSPITAL_BASED_OUTPATIENT_CLINIC_OR_DEPARTMENT_OTHER): Payer: Medicare Other

## 2021-07-14 ENCOUNTER — Other Ambulatory Visit (HOSPITAL_BASED_OUTPATIENT_CLINIC_OR_DEPARTMENT_OTHER): Payer: Self-pay

## 2021-07-14 ENCOUNTER — Emergency Department (HOSPITAL_COMMUNITY): Payer: Medicare Other

## 2021-07-14 ENCOUNTER — Encounter (HOSPITAL_COMMUNITY): Payer: Self-pay | Admitting: Internal Medicine

## 2021-07-14 DIAGNOSIS — E782 Mixed hyperlipidemia: Secondary | ICD-10-CM | POA: Diagnosis present

## 2021-07-14 DIAGNOSIS — R270 Ataxia, unspecified: Secondary | ICD-10-CM | POA: Diagnosis not present

## 2021-07-14 DIAGNOSIS — I6389 Other cerebral infarction: Secondary | ICD-10-CM

## 2021-07-14 DIAGNOSIS — Z7902 Long term (current) use of antithrombotics/antiplatelets: Secondary | ICD-10-CM | POA: Diagnosis not present

## 2021-07-14 DIAGNOSIS — Z96659 Presence of unspecified artificial knee joint: Secondary | ICD-10-CM | POA: Diagnosis not present

## 2021-07-14 DIAGNOSIS — K219 Gastro-esophageal reflux disease without esophagitis: Secondary | ICD-10-CM | POA: Diagnosis present

## 2021-07-14 DIAGNOSIS — Z6832 Body mass index (BMI) 32.0-32.9, adult: Secondary | ICD-10-CM | POA: Diagnosis not present

## 2021-07-14 DIAGNOSIS — I639 Cerebral infarction, unspecified: Secondary | ICD-10-CM | POA: Diagnosis present

## 2021-07-14 DIAGNOSIS — Z7901 Long term (current) use of anticoagulants: Secondary | ICD-10-CM | POA: Diagnosis not present

## 2021-07-14 DIAGNOSIS — E669 Obesity, unspecified: Secondary | ICD-10-CM | POA: Diagnosis not present

## 2021-07-14 DIAGNOSIS — R202 Paresthesia of skin: Secondary | ICD-10-CM | POA: Diagnosis present

## 2021-07-14 DIAGNOSIS — Z91040 Latex allergy status: Secondary | ICD-10-CM | POA: Diagnosis not present

## 2021-07-14 DIAGNOSIS — Z7982 Long term (current) use of aspirin: Secondary | ICD-10-CM | POA: Diagnosis not present

## 2021-07-14 DIAGNOSIS — E039 Hypothyroidism, unspecified: Secondary | ICD-10-CM | POA: Diagnosis not present

## 2021-07-14 DIAGNOSIS — I10 Essential (primary) hypertension: Secondary | ICD-10-CM | POA: Diagnosis not present

## 2021-07-14 DIAGNOSIS — Z79899 Other long term (current) drug therapy: Secondary | ICD-10-CM | POA: Diagnosis not present

## 2021-07-14 DIAGNOSIS — I6330 Cerebral infarction due to thrombosis of unspecified cerebral artery: Secondary | ICD-10-CM

## 2021-07-14 LAB — CBC WITH DIFFERENTIAL/PLATELET
Abs Immature Granulocytes: 0.04 10*3/uL (ref 0.00–0.07)
Basophils Absolute: 0.1 10*3/uL (ref 0.0–0.1)
Basophils Relative: 1 %
Eosinophils Absolute: 0.2 10*3/uL (ref 0.0–0.5)
Eosinophils Relative: 1 %
HCT: 39.4 % (ref 36.0–46.0)
Hemoglobin: 12.8 g/dL (ref 12.0–15.0)
Immature Granulocytes: 0 %
Lymphocytes Relative: 18 %
Lymphs Abs: 2.1 10*3/uL (ref 0.7–4.0)
MCH: 28.6 pg (ref 26.0–34.0)
MCHC: 32.5 g/dL (ref 30.0–36.0)
MCV: 87.9 fL (ref 80.0–100.0)
Monocytes Absolute: 1.3 10*3/uL — ABNORMAL HIGH (ref 0.1–1.0)
Monocytes Relative: 11 %
Neutro Abs: 8.2 10*3/uL — ABNORMAL HIGH (ref 1.7–7.7)
Neutrophils Relative %: 69 %
Platelets: 425 10*3/uL — ABNORMAL HIGH (ref 150–400)
RBC: 4.48 MIL/uL (ref 3.87–5.11)
RDW: 14.4 % (ref 11.5–15.5)
WBC: 11.8 10*3/uL — ABNORMAL HIGH (ref 4.0–10.5)
nRBC: 0 % (ref 0.0–0.2)

## 2021-07-14 LAB — COMPREHENSIVE METABOLIC PANEL
ALT: 17 U/L (ref 0–44)
AST: 19 U/L (ref 15–41)
Albumin: 3.8 g/dL (ref 3.5–5.0)
Alkaline Phosphatase: 96 U/L (ref 38–126)
Anion gap: 9 (ref 5–15)
BUN: 16 mg/dL (ref 8–23)
CO2: 23 mmol/L (ref 22–32)
Calcium: 9.2 mg/dL (ref 8.9–10.3)
Chloride: 105 mmol/L (ref 98–111)
Creatinine, Ser: 1 mg/dL (ref 0.44–1.00)
GFR, Estimated: 57 mL/min — ABNORMAL LOW (ref >60)
Glucose, Bld: 103 mg/dL — ABNORMAL HIGH (ref 70–99)
Potassium: 4.2 mmol/L (ref 3.5–5.1)
Sodium: 137 mmol/L (ref 135–145)
Total Bilirubin: 0.8 mg/dL (ref 0.3–1.2)
Total Protein: 7.5 g/dL (ref 6.5–8.1)

## 2021-07-14 LAB — LIPID PANEL
Cholesterol: 172 mg/dL (ref 0–200)
HDL: 69 mg/dL (ref >40)
LDL Cholesterol: 93 mg/dL (ref 0–99)
Total CHOL/HDL Ratio: 2.5 RATIO
Triglycerides: 48 mg/dL (ref <150)
VLDL: 10 mg/dL (ref 0–40)

## 2021-07-14 LAB — ECHOCARDIOGRAM COMPLETE BUBBLE STUDY
AR max vel: 2.17 cm2
AV Peak grad: 3.6 mmHg
Ao pk vel: 0.95 m/s
Area-P 1/2: 3.42 cm2
S' Lateral: 2.8 cm

## 2021-07-14 LAB — HEMOGLOBIN A1C
Hgb A1c MFr Bld: 6.2 % — ABNORMAL HIGH (ref 4.8–5.6)
Mean Plasma Glucose: 131.24 mg/dL

## 2021-07-14 LAB — MAGNESIUM: Magnesium: 1.9 mg/dL (ref 1.7–2.4)

## 2021-07-14 LAB — TROPONIN I (HIGH SENSITIVITY): Troponin I (High Sensitivity): 4 ng/L (ref <18)

## 2021-07-14 MED ORDER — PRAVASTATIN SODIUM 10 MG PO TABS: 10.0000 mg | ORAL_TABLET | Freq: Every day | ORAL | Status: DC

## 2021-07-14 MED ORDER — ACETAMINOPHEN 650 MG RE SUPP: 650.0000 mg | Freq: Four times a day (QID) | RECTAL | Status: DC | PRN

## 2021-07-14 MED ORDER — PANTOPRAZOLE SODIUM 40 MG PO TBEC
40.0000 mg | DELAYED_RELEASE_TABLET | Freq: Every day | ORAL | Status: DC
Start: 2021-07-14 — End: 2021-07-15
  Administered 2021-07-14 – 2021-07-15 (×2): 40 mg via ORAL
  Filled 2021-07-14 (×2): qty 1

## 2021-07-14 MED ORDER — LEVOTHYROXINE SODIUM 75 MCG PO TABS
75.0000 ug | ORAL_TABLET | Freq: Every day | ORAL | Status: DC
  Administered 2021-07-14 – 2021-07-15 (×2): 75 ug via ORAL
  Filled 2021-07-14 (×2): qty 1

## 2021-07-14 MED ORDER — ATORVASTATIN CALCIUM 80 MG PO TABS
80.0000 mg | ORAL_TABLET | Freq: Every day | ORAL | Status: DC
Start: 2021-07-14 — End: 2021-07-15
  Administered 2021-07-14 – 2021-07-15 (×2): 80 mg via ORAL
  Filled 2021-07-14: qty 2
  Filled 2021-07-14: qty 1

## 2021-07-14 MED ORDER — ASPIRIN 81 MG PO TBEC
81.0000 mg | DELAYED_RELEASE_TABLET | Freq: Every day | ORAL | Status: DC
  Administered 2021-07-14 – 2021-07-15 (×2): 81 mg via ORAL
  Filled 2021-07-14 (×2): qty 1

## 2021-07-14 MED ORDER — ASPIRIN 300 MG RE SUPP: 300.0000 mg | Freq: Every day | RECTAL | Status: DC

## 2021-07-14 MED ORDER — ONDANSETRON HCL 4 MG PO TABS: 4.0000 mg | ORAL_TABLET | Freq: Four times a day (QID) | ORAL | Status: DC | PRN

## 2021-07-14 MED ORDER — ONDANSETRON HCL 4 MG/2ML IJ SOLN
4.0000 mg | Freq: Four times a day (QID) | INTRAMUSCULAR | Status: DC | PRN
Start: 2021-07-14 — End: 2021-07-15

## 2021-07-14 MED ORDER — CLOPIDOGREL BISULFATE 75 MG PO TABS
75.0000 mg | ORAL_TABLET | Freq: Every day | ORAL | Status: DC
  Administered 2021-07-14 – 2021-07-15 (×2): 75 mg via ORAL
  Filled 2021-07-14 (×2): qty 1

## 2021-07-14 MED ORDER — ACETAMINOPHEN 325 MG PO TABS
650.0000 mg | ORAL_TABLET | Freq: Four times a day (QID) | ORAL | Status: DC | PRN
  Administered 2021-07-14 (×3): 650 mg via ORAL
  Filled 2021-07-14 (×3): qty 2

## 2021-07-14 MED ORDER — HYDRALAZINE HCL 20 MG/ML IJ SOLN: 10.0000 mg | Freq: Four times a day (QID) | INTRAMUSCULAR | Status: DC | PRN

## 2021-07-14 MED ORDER — STROKE: EARLY STAGES OF RECOVERY BOOK
Freq: Once | Status: AC
  Filled 2021-07-14: qty 1

## 2021-07-14 MED ORDER — PREGABALIN 25 MG PO CAPS
25.0000 mg | ORAL_CAPSULE | Freq: Two times a day (BID) | ORAL | Status: DC
  Administered 2021-07-14 – 2021-07-15 (×3): 25 mg via ORAL
  Filled 2021-07-14 (×3): qty 1

## 2021-07-14 MED ORDER — ENOXAPARIN SODIUM 40 MG/0.4ML IJ SOSY
40.0000 mg | PREFILLED_SYRINGE | INTRAMUSCULAR | Status: DC
  Administered 2021-07-14 – 2021-07-15 (×2): 40 mg via SUBCUTANEOUS
  Filled 2021-07-14 (×2): qty 0.4

## 2021-07-14 MED ORDER — CHOLECALCIFEROL 10 MCG (400 UNIT) PO TABS
400.0000 [IU] | ORAL_TABLET | Freq: Every day | ORAL | Status: DC
  Administered 2021-07-14 – 2021-07-15 (×2): 400 [IU] via ORAL
  Filled 2021-07-14 (×2): qty 1

## 2021-07-14 MED ORDER — POLYETHYLENE GLYCOL 3350 17 G PO PACK: 17.0000 g | PACK | Freq: Every day | ORAL | Status: DC | PRN

## 2021-07-14 NOTE — Progress Notes (Addendum)
STROKE TEAM PROGRESS NOTE   INTERVAL HISTORY Her son is at the bedside.  Left arm feeling heavy and weak, symptoms started around 1130 on 5/30 and persisted. She denies numbness, tingling, facial droop.   Vitals:   07/14/21 0500 07/14/21 0600 07/14/21 0900 07/14/21 1235  BP: (!) 142/58 (!) 114/55 (!) 125/95 92/71  Pulse: (!) 48 (!) 51 (!) 57 (!) 58  Resp: 15 10 12 12   Temp:    98.2 F (36.8 C)  TempSrc:    Oral  SpO2: 98% 98% 100% 97%  Weight:       CBC:  Recent Labs  Lab 07/13/21 2237 07/14/21 0910  WBC 13.5* 11.8*  NEUTROABS 9.8* 8.2*  HGB 12.7 12.8  HCT 38.6 39.4  MCV 87.7 87.9  PLT 442* 425*   Basic Metabolic Panel:  Recent Labs  Lab 07/13/21 2237 07/14/21 0910  NA 136 137  K 3.6 4.2  CL 102 105  CO2 26 23  GLUCOSE 132* 103*  BUN 22 16  CREATININE 1.10* 1.00  CALCIUM 9.3 9.2  MG  --  1.9   Lipid Panel:  Recent Labs  Lab 07/14/21 0910  CHOL 172  TRIG 48  HDL 69  CHOLHDL 2.5  VLDL 10  LDLCALC 93   HgbA1c:  Recent Labs  Lab 07/14/21 0910  HGBA1C 6.2*   Urine Drug Screen: No results for input(s): LABOPIA, COCAINSCRNUR, LABBENZ, AMPHETMU, THCU, LABBARB in the last 168 hours.  Alcohol Level No results for input(s): ETH in the last 168 hours.  IMAGING past 24 hours CT Angio Head W or Wo Contrast  Result Date: 07/14/2021 CLINICAL DATA:  Left hand heaviness EXAM: CT ANGIOGRAPHY HEAD AND NECK TECHNIQUE: Multidetector CT imaging of the head and neck was performed using the standard protocol during bolus administration of intravenous contrast. Multiplanar CT image reconstructions and MIPs were obtained to evaluate the vascular anatomy. Carotid stenosis measurements (when applicable) are obtained utilizing NASCET criteria, using the distal internal carotid diameter as the denominator. RADIATION DOSE REDUCTION: This exam was performed according to the departmental dose-optimization program which includes automated exposure control, adjustment of the mA and/or  kV according to patient size and/or use of iterative reconstruction technique. CONTRAST:  07/16/2021 OMNIPAQUE IOHEXOL 350 MG/ML SOLN COMPARISON:  None Available. FINDINGS: CT HEAD FINDINGS Brain: There is no mass, hemorrhage or extra-axial collection. There is generalized atrophy without lobar predilection. There is no acute or chronic infarction. The brain parenchyma is normal. Skull: The visualized skull base, calvarium and extracranial soft tissues are normal. Sinuses/Orbits: No fluid levels or advanced mucosal thickening of the visualized paranasal sinuses. No mastoid or middle ear effusion. The orbits are normal. CTA NECK FINDINGS SKELETON: There is no bony spinal canal stenosis. No lytic or blastic lesion. OTHER NECK: Normal pharynx, larynx and major salivary glands. No cervical lymphadenopathy. Unremarkable thyroid gland. UPPER CHEST: No pneumothorax or pleural effusion. No nodules or masses. AORTIC ARCH: There is calcific atherosclerosis of the aortic arch. There is no aneurysm, dissection or hemodynamically significant stenosis of the visualized portion of the aorta. Conventional 3 vessel aortic branching pattern. The visualized proximal subclavian arteries are widely patent. RIGHT CAROTID SYSTEM: No dissection, occlusion or aneurysm. Mild atherosclerotic calcification at the carotid bifurcation without hemodynamically significant stenosis. LEFT CAROTID SYSTEM: No dissection, occlusion or aneurysm. Mild atherosclerotic calcification at the carotid bifurcation without hemodynamically significant stenosis. VERTEBRAL ARTERIES: Left dominant configuration. Moderate narrowing of the right vertebral artery origin. Otherwise normal vertebral arteries. CTA HEAD FINDINGS POSTERIOR CIRCULATION: --  Vertebral arteries: Normal V4 segments. --Inferior cerebellar arteries: Normal. --Basilar artery: Normal. --Superior cerebellar arteries: Normal. --Posterior cerebral arteries (PCA): Normal. ANTERIOR CIRCULATION: --Intracranial  internal carotid arteries: Normal. --Anterior cerebral arteries (ACA): Normal. Both A1 segments are present. Patent anterior communicating artery (a-comm). --Middle cerebral arteries (MCA): Normal. VENOUS SINUSES: As permitted by contrast timing, patent. ANATOMIC VARIANTS: None Review of the MIP images confirms the above findings. IMPRESSION: 1. No emergent large vessel occlusion or high-grade stenosis of the intracranial arteries. 2. Moderate narrowing of the right vertebral artery origin. 3. Aortic Atherosclerosis (ICD10-I70.0). Electronically Signed   By: Deatra Robinson M.D.   On: 07/14/2021 00:15   CT Angio Neck W and/or Wo Contrast  Result Date: 07/14/2021 CLINICAL DATA:  Left hand heaviness EXAM: CT ANGIOGRAPHY HEAD AND NECK TECHNIQUE: Multidetector CT imaging of the head and neck was performed using the standard protocol during bolus administration of intravenous contrast. Multiplanar CT image reconstructions and MIPs were obtained to evaluate the vascular anatomy. Carotid stenosis measurements (when applicable) are obtained utilizing NASCET criteria, using the distal internal carotid diameter as the denominator. RADIATION DOSE REDUCTION: This exam was performed according to the departmental dose-optimization program which includes automated exposure control, adjustment of the mA and/or kV according to patient size and/or use of iterative reconstruction technique. CONTRAST:  OMNIPAQUE IOHEXOL 350 MG/ML SOLN COMPARISON:  None Available. FINDINGS: CT HEAD FINDINGS Brain: There is no mass, hemorrhage or extra-axial collection. There is generalized atrophy without lobar predilection. There is no acute or chronic infarction. The brain parenchyma is normal. Skull: The visualized skull base, calvarium and extracranial soft tissues are normal. Sinuses/Orbits: No fluid levels or advanced mucosal thickening of the visualized paranasal sinuses. No mastoid or middle ear effusion. The orbits are normal. CTA NECK  FINDINGS SKELETON: There is no bony spinal canal stenosis. No lytic or blastic lesion. OTHER NECK: Normal pharynx, larynx and major salivary glands. No cervical lymphadenopathy. Unremarkable thyroid gland. UPPER CHEST: No pneumothorax or pleural effusion. No nodules or masses. AORTIC ARCH: There is calcific atherosclerosis of the aortic arch. There is no aneurysm, dissection or hemodynamically significant stenosis of the visualized portion of the aorta. Conventional 3 vessel aortic branching pattern. The visualized proximal subclavian arteries are widely patent. RIGHT CAROTID SYSTEM: No dissection, occlusion or aneurysm. Mild atherosclerotic calcification at the carotid bifurcation without hemodynamically significant stenosis. LEFT CAROTID SYSTEM: No dissection, occlusion or aneurysm. Mild atherosclerotic calcification at the carotid bifurcation without hemodynamically significant stenosis. VERTEBRAL ARTERIES: Left dominant configuration. Moderate narrowing of the right vertebral artery origin. Otherwise normal vertebral arteries. CTA HEAD FINDINGS POSTERIOR CIRCULATION: --Vertebral arteries: Normal V4 segments. --Inferior cerebellar arteries: Normal. --Basilar artery: Normal. --Superior cerebellar arteries: Normal. --Posterior cerebral arteries (PCA): Normal. ANTERIOR CIRCULATION: --Intracranial internal carotid arteries: Normal. --Anterior cerebral arteries (ACA): Normal. Both A1 segments are present. Patent anterior communicating artery (a-comm). --Middle cerebral arteries (MCA): Normal. VENOUS SINUSES: As permitted by contrast timing, patent. ANATOMIC VARIANTS: None Review of the MIP images confirms the above findings. IMPRESSION: 1. No emergent large vessel occlusion or high-grade stenosis of the intracranial arteries. 2. Moderate narrowing of the right vertebral artery origin. 3. Aortic Atherosclerosis (ICD10-I70.0). Electronically Signed   By: Deatra Robinson M.D.   On: 07/14/2021 00:15   MR BRAIN WO  CONTRAST  Result Date: 07/14/2021 CLINICAL DATA:  81 year old female with neurologic deficit, left upper extremity heaviness. EXAM: MRI HEAD WITHOUT CONTRAST TECHNIQUE: Multiplanar, multiecho pulse sequences of the brain and surrounding structures were obtained without intravenous contrast. COMPARISON:  CTA  head and neck 2347 hours. [facility]. FINDINGS: Brain: Small cortical and subcortical white matter restricted diffusion at the superior right motor strip, left upper extremity representation area (series 5 images 81 through 84. Minimal if any associated T2 and FLAIR hyperintensity. No other restricted diffusion. No midline shift, mass effect, evidence of mass lesion, ventriculomegaly, extra-axial collection or acute intracranial hemorrhage. Cervicomedullary junction and pituitary are within normal limits. Outside of the superior perirolandic cortex gray and white matter signal is stable since last year and within normal limits for age. No chronic cerebral blood products identified. Vascular: Major intracranial vascular flow voids are stable since last year. Skull and upper cervical spine: Visualized bone marrow signal is within normal limits. Partially visible cervical spine degeneration. Sinuses/Orbits: Stable, negative. Other: Mastoids remain clear. Visible internal auditory structures appear normal. Negative visible scalp and face. IMPRESSION: 1. Positive for small acute cortical and subcortical white matter infarcts at the superior right motor strip, left upper extremity representation area. No associated hemorrhage or mass effect. 2. Elsewhere stable from last year and normal for age noncontrast MRI appearance of the brain. Electronically Signed   By: Odessa FlemingH  Hall M.D.   On: 07/14/2021 04:43    PHYSICAL EXAM Awake alert oriented x3 No dysarthria No aphasia Cranial nerves: Pupils equal round react light, extraocular movements intact, visual fields full, facial sensation  intact, face symmetric, auditory acuity diminished bilaterally, tongue and palate midline. Motor examination with no arm drift but there is definitely weakness on the left hand more prominently in the extensors and the small muscle of the hand.  Right upper and lower extremity are full strength.  Left lower extremity is also nearly full strength. RUE 5/5 LUE 3/5 distal weakenss with drift but does not hit the bed RLE 5/5 LLE 4+/5- no drift Sensory exam: Intact to touch bilaterally Coordination with dysmetria disproportionate to the arm weakness on the left.  ASSESSMENT/PLAN Ms. Vinnie LevelSylvia Byrd is a 81 y.o. female with history of hyperlipidemia, hypertension, gastroesophageal reflux disease, hypothyroidism, MGUS, vitamin D deficiency presenting with left arm weakness. MRI shows a small acute infarct in the right motor strip.  Initiate DAPT therapy with aspirin and Plavix for 3 weeks and then aspirin 81 mg alone.  Awaiting 2D echo with bubble study.  Cardiology contacted 30-day monitor.  [facility]   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 No PFO. LDL No results found for requested labs within last [id number] hours. HgbA1c No results found for requested labs within last [id number] hours. VTE prophylaxis - Lovenox    Diet   Diet Heart Room service appropriate? Yes; Fluid consistency: Thin   No antithrombotic prior to admission, now on aspirin 81 mg daily and clopidogrel 75 mg daily for 3 weeks and then aspirin 81mg   Therapy recommendations:  Pending Disposition:  Pending  Hypertension Home meds:  atenolol Stable Permissive hypertension (OK if < 220/120) but gradually normalize in 5-7 days Long-term BP goal normotensive  Hyperlipidemia Home meds:  Pravastatin 10mg  LDL 93, goal < 70 Change  to Atorvastatin 80mg  Continue statin at discharge  Other Stroke Risk Factors Advanced Age >/= [address]  Obesity, Body mass index is 32.7 kg/m., BMI >/= 30 associated with increased stroke risk, recommend weight loss, diet and exercise as  appropriate   Other Active Problems GERD-PPI resumed  Hospital day # 0  Patient seen and examined by NP/APP with MD. MD to update note as needed.   Elmer Picker, DNP, FNP-BC [facility] Pager: [phone]  ATTENDING ATTESTATION:  81 year old with a right motor strip ischemic CVA on MRI.  She has weakness on the left septic the left upper extremity.  No numbness.  No speech changes.  Now on DAPT for 3 weeks therapy then will be on aspirin 81 alone.  She is trying to understand why she has had a stroke since she is living a healthy lifestyle.  Her blood pressure is under control.  Discussed with her that sometimes age is a risk factor.  She also has some exposure to secondhand smoke.  She is also been undergoing some stress from the recent passing of her husband in December which can lead to spikes in her blood pressure.  Provided reassurance.  She would likely benefit from extensive inpatient rehab given her good potential for recovery.  Patient's and her son's questions answered to their satisfaction.  Neurology sign off please call with questions  Dr. Viviann Spare evaluated pt independently, reviewed imaging, chart, labs. Discussed and formulated plan with the APP. Please see APP note above for details.   Total 36 minutes spent on counseling patient and coordinating care, writing notes and reviewing chart.   Tiffany Paternostro,MD   To contact Stroke Continuity provider, please refer to [email or web address]. After hours, contact General Neurology

## 2021-07-14 NOTE — Progress Notes (Signed)
PT Cancellation Note  Patient Details Name: Tiffany Byrd MRN: [id number] DOB: 03-04-1940   Cancelled Treatment:    Reason Eval/Treat Not Completed: Other (comment). Pt reports LUE weakness with no other deficits identified at this time. Pt has been able to ambulate with use of her cane and staff assistance, reporting no gait abnormalities. Pt with OT consult ordered at this time. PT and the patient discuss that OT will likely be better served for the patient at this time, patient is in agreement. PT will sign off, if any mobility concerns arise please re-consult.   Arlyss Gandy 07/14/2021, 4:19 PM

## 2021-07-14 NOTE — Assessment & Plan Note (Signed)
   MRI Imaging reveals an acute cortical and subcortical white matter infarct in the superior right motor strip  On exam patient still exhibits the following deficit:  Left upper extremity weakness.  Performing serial neurologic checks  Monitoring patient on telemetry  Initiating antiplatelet therapy including Aspirin and Plavix  Atorvastatin 80mg  Qdaily per Neurology recommendations  Lipid Panel with AM labs  Obtaining hemoglobin A1c and lipid panel in the morning  Echocardiogram in the morning  PT, OT, SLP evaluation  Permissive hypertension with as needed antihypertensives only to be given if blood pressure greater than 220/115  Neurology following in consultation.

## 2021-07-14 NOTE — Assessment & Plan Note (Signed)
Continuing home regimen of daily PPI therapy.  

## 2021-07-14 NOTE — Assessment & Plan Note (Signed)
   Atorvastatin 80mg  Qdaily per neurology recommendations

## 2021-07-14 NOTE — Assessment & Plan Note (Signed)
.   Holding home antihypertensives . Permissive hypertension . PRN intravenous antihypertensives for excessively elevated blood pressure of over 220/115

## 2021-07-14 NOTE — ED Triage Notes (Signed)
Pt BIB [facility] due to imaging request for MRI. Per [facility] they also notice pt would brady down while en route into the 40's-50's

## 2021-07-14 NOTE — ED Provider Notes (Signed)
4:55 AM Assumed care from Dr. Billy Fischer, please see their note for full history, physical and decision making until this point. In brief this is a 81 y.o. year old female who presented to the ED tonight with Numbness     Here with LUE motor/sensory changes since yesterday AM. Needs MRI. MRI positive for stroke. Neuro aware. Will admit.    Labs, studies and imaging reviewed by myself and considered in medical decision making if ordered. Imaging interpreted by radiology.  Labs Reviewed  CBC WITH DIFFERENTIAL/PLATELET - Abnormal; Notable for the following components:      Result Value   WBC 13.5 (*)    Platelets 442 (*)    Neutro Abs 9.8 (*)    Monocytes Absolute 1.3 (*)    All other components within normal limits  COMPREHENSIVE METABOLIC PANEL - Abnormal; Notable for the following components:   Glucose, Bld 132 (*)    Creatinine, Ser 1.10 (*)    GFR, Estimated 50 (*)    All other components within normal limits  TROPONIN I (HIGH SENSITIVITY)  TROPONIN I (HIGH SENSITIVITY)    MR BRAIN WO CONTRAST  Final Result    CT Angio Head W or Wo Contrast  Final Result    CT Angio Neck W and/or Wo Contrast  Final Result      No follow-ups on file.    Shayden Gingrich, Corene Cornea, MD 07/14/21 [id number]

## 2021-07-14 NOTE — H&P (Signed)
History and Physical    Patient: Tiffany Byrd MRN: [id number] DOA: 07/13/2021  Date of Service: the patient was seen and examined on 07/14/2021  Patient coming from: Home via [facility]  Chief Complaint:  Chief Complaint  Patient presents with   Numbness    HPI:   81 year old female with past medical history of hyperlipidemia, hypertension, gastroesophageal reflux disease, hypothyroidism, MGUS, vitamin D deficiency who presents to med [facility] emergency department with left upper extremity weakness.  Patient explains that at approximately 11:30 AM on 5/20 shortly after waking up she noticed that her left arm was heavy and difficult to maneuver.  Patient noticed that her symptoms continue to persist throughout the day as she was unable to properly hold objects with her left hand.  Patient denies any speech difficulty, facial droop, headaches or visual changes over the span of time.  Patient denies any associated neck pain or fevers.  Due to patient's symptoms persisting for several hours throughout the day patient decided to eventually present to [facility] emergency department for evaluation.  Upon evaluation in the emergency department ER provider was concerned for an acute stroke.  Case was discussed with Dr. Wilford Corner with neurology who recommended obtaining stat CT and CT angiogram of the head and neck which revealed mild narrowing of the right vertebral artery and otherwise was unremarkable.  Dr. Rennis Chris then recommended transfer to [facility] emergency department for urgent MRI.  Upon arrival at the [facility] emergency department, patient underwent noncontrast MRI of the brain which revealed a small acute cortical and subcortical white matter infarct in the superior right motor strip that would correlate with the left upper extremity.  Due to identification of a acute/subacute stroke, the hospital scrip was then called to assess the patient for admission to  the hospital.    Review of Systems: Review of Systems  Neurological:  Positive for sensory change and weakness.  All other systems reviewed and are negative.   Past Medical History:  Diagnosis Date   GERD (gastroesophageal reflux disease)    Hyperlipidemia    Thyroid disease     Past Surgical History:  Procedure Laterality Date   ABDOMINAL HYSTERECTOMY     CHOLECYSTECTOMY     LUMBAR LAMINECTOMY/DECOMPRESSION MICRODISCECTOMY     REPLACEMENT TOTAL KNEE      Social History:  reports that she has never smoked. She has never used smokeless tobacco. No history on file for alcohol use and drug use.  Allergies  Allergen Reactions   Latex    Penicillins    Hydrocodone-Acetaminophen Anxiety    Family History  Problem Relation Age of Onset   Heart disease Neg Hx     Prior to Admission medications   Medication Sig Start Date End Date Taking? Authorizing Provider  acetaminophen (TYLENOL) 500 MG tablet Take 500 mg by mouth every 6 (six) hours as needed for moderate pain.   Yes [provider]  atenolol (TENORMIN) 25 MG tablet Take 25 mg by mouth at bedtime. 04/30/21  Yes [provider]  cholecalciferol (VITAMIN D) 400 units TABS tablet Take 400 Units by mouth.   Yes [provider]  Cyclobenzaprine HCl (FLEXERIL PO) Take 1 tablet by mouth daily as needed (muscle spasms).   Yes [provider]  ibuprofen (ADVIL) 200 MG tablet Take 200 mg by mouth every 6 (six) hours as needed for mild pain.   Yes [provider]  levothyroxine (SYNTHROID) 75 MCG tablet Take 75 mcg  by mouth daily before breakfast.   Yes [provider]  Multiple Vitamin (MULTIVITAMIN) LIQD Take 5 mLs by mouth daily.   Yes [provider]  Omeprazole (PRILOSEC PO) Take 40 mg by mouth daily.   Yes [provider]  pravastatin (PRAVACHOL) 10 MG tablet Take 10 mg by mouth daily.   Yes [provider]  pregabalin (LYRICA) 25 MG capsule Take 25  mg by mouth 2 (two) times daily. 06/14/21  Yes [provider]  triamterene-hydrochlorothiazide (DYAZIDE) 37.5-25 MG capsule Take 1 capsule by mouth daily.   Yes [provider]  celecoxib (CELEBREX) 200 MG capsule One to 2 tablets by mouth daily as needed for pain. Patient not taking: Reported on 07/14/2021 03/05/20   Myra RudeSchmitz, Jeremy E, MD  DULoxetine (CYMBALTA) 30 MG capsule Take 30 mg by mouth at bedtime. Patient not taking: Reported on 07/14/2021 03/30/20   [provider]  HYDROcodone-acetaminophen (NORCO/VICODIN) 5-325 MG tablet Take 1 tablet by mouth every 8 (eight) hours as needed. Patient not taking: Reported on 07/14/2021 05/28/20   Myra RudeSchmitz, Jeremy E, MD  metoCLOPramide (REGLAN) 5 MG tablet Take 5 mg by mouth 4 (four) times daily. Patient not taking: Reported on 07/14/2021    [provider]    Physical Exam:  Vitals:   07/14/21 0433 07/14/21 0500 07/14/21 0600 07/14/21 0900  BP: 135/62 (!) 142/58 (!) 114/55 (!) 125/95  Pulse: (!) 58 (!) 48 (!) 51 (!) 57  Resp: 17 15 10 12   Temp:      TempSrc:      SpO2: 99% 98% 98% 100%  Weight:        Constitutional: Awake alert and oriented x3, no associated distress.   Skin: no rashes, no lesions, good skin turgor noted. Eyes: Pupils are equally reactive to light.  No evidence of scleral icterus or conjunctival pallor.  ENMT: Moist mucous membranes noted.  Posterior pharynx clear of any exudate or lesions.   Neck: normal, supple, no masses, no thyromegaly.  No evidence of jugular venous distension.   Respiratory: clear to auscultation bilaterally, no wheezing, no crackles. Normal respiratory effort. No accessory muscle use.  Cardiovascular: Regular rate and rhythm, no murmurs / rubs / gallops. No extremity edema. 2+ pedal pulses. No carotid bruits.  Chest:   Nontender without crepitus or deformity.   Back:   Nontender without crepitus or deformity. Abdomen: Abdomen is soft and nontender.  No evidence of  intra-abdominal masses.  Positive bowel sounds noted in all quadrants.   Musculoskeletal: No joint deformity upper and lower extremities. Good ROM, no contractures. Normal muscle tone.  Neurologic: CN 2-12 grossly intact. Sensation intact.  4/5 strength of the proximal and distal muscle groups of LUE.   Patient is following all commands.  Patient is responsive to verbal stimuli.   Psychiatric: Patient exhibits normal mood with appropriate affect.  Patient seems to possess insight as to their current situation.    Data Reviewed:  I have personally reviewed and interpreted labs, imaging.  Significant findings are:  CT angiogram of the head and neck revealing no emergent large vessel occlusion or high-grade stenosis with moderate narrowing of the right vertebral artery.   MRI brain without contrast positive for small acute cortical and subcortical white matter infarcts in the superior right motor strip, left upper extremity representation area with no associated hemorrhage or mass effect.   CBC revealing white blood cell count of 13.5 with hemoglobin of 12.7, hematocrit 38.5 and platelet count of 442.  Chemistry revealing sodium 136, potassium 3.6, glucose 132, creatinine 1.10.   Troponin 3.   EKG: Personally reviewed.  Rhythm is sinus bradycardia with heart rate of 52 bpm.  No dynamic ST segment changes appreciated.   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 Performing serial neurologic checks Monitoring patient on telemetry Initiating antiplatelet therapy including Aspirin and Plavix Atorvastatin 80mg  Qdaily per Neurology recommendations Lipid Panel with AM labs Obtaining hemoglobin A1c and lipid panel in the morning Echocardiogram in the morning PT, OT, SLP  evaluation Permissive hypertension with as needed antihypertensives only to be given if blood pressure greater than 220/115 Neurology following in consultation.   Essential hypertension Holding home antihypertensives Permissive hypertension PRN intravenous antihypertensives for excessively elevated blood pressure of over 220/115    Mixed hyperlipidemia Atorvastatin 80mg  Qdaily per neurology recommendations  GERD without esophagitis Continuing home regimen of daily PPI therapy.        Code Status:  Full code  code status decision has been confirmed with: patient/son Family Communication: Son at the bedside has been updated on plan of care.   Consults: Dr. with Neurology  Severity of Illness:  The appropriate patient status for this patient is OBSERVATION. Observation status is judged to be reasonable and necessary in order to provide the required intensity of service to ensure the patient's safety. The patient's presenting symptoms, physical exam findings, and initial radiographic and laboratory data in the context of their medical condition is felt to place them at decreased risk for further clinical deterioration. Furthermore, it is anticipated that the patient will be medically stable for discharge from the hospital within 2 midnights of admission.   Author:  MD  07/14/2021 9:41 AM

## 2021-07-14 NOTE — ED Notes (Signed)
RN ordered pt food tray.  

## 2021-07-14 NOTE — Consult Note (Signed)
Neurology Consultation  Reason for Consult: left sided weakness Referring Physician: Dr. Rolan Lipa  CC: Weakness of the left hand  History is obtained from: Patient, chart  HPI: Tiffany Byrd is a 81 y.o. female past medical history of thyroid disease, hyperlipidemia, hypertension, presenting to the emergency department freestanding ER at [facility] with complaints of heaviness and weakness of the left hand along with clumsiness on the left and since about 11:30 AM 07/13/2021.  She woke up completely finding the symptoms were sudden onset around 11:30 AM to 12 PM She presented to the emergency room the evening of 07/13/2021 with complaints of sudden onset of left arm clumsiness and inability to hold objects with the left hand.  Symptoms did not resolve over few hours that made her decide to come to get checked out.  She was noted to be somewhat ataxic at that time by the provider as well as weak for which I was called over the phone and I recommended getting an MRI done to evaluate for a lacunar infarct versus hand territory stroke.  Clinical exam of the ER was not consistent with an LVO. CT angiography was completed-confirming no emergent LVO. Patient transferred over to [facility] ED with MRI confirmation of stroke - see below.  Neurological consultation for work-up and further management of the acute stroke. Patient denies any prior history of strokes her son had a stroke 1 year ago but no other family member with a history of stroke.  Reports compliance with medications.  Another son accompanied her to the ER and corroborated the history.   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  ROS: Full ROS was performed and is negative except as noted in the HPI.   Past Medical History:  Diagnosis Date   GERD (gastroesophageal  reflux disease)    Hyperlipidemia    Thyroid disease    No family history on file. Son with stroke  Social History:   reports that she has never smoked. She has never used smokeless tobacco. No history on file for alcohol use and drug use.  Medications  Current Facility-Administered Medications:    triamcinolone acetonide (KENALOG-40) injection 40 mg, 40 mg, Intra-articular, Once, Rosemarie Ax, MD  Current Outpatient Medications:    celecoxib (CELEBREX) 200 MG capsule, One to 2 tablets by mouth daily as needed for pain. (Patient not taking: Reported on 08/24/2020), Disp: 60 capsule, Rfl: 2   cholecalciferol (VITAMIN D) 400 units TABS tablet, Take 400 Units by mouth., Disp: , Rfl:    Cyclobenzaprine HCl (FLEXERIL PO), Take by mouth., Disp: , Rfl:    DULoxetine (CYMBALTA) 30 MG capsule, Take 30 mg by mouth at bedtime., Disp: , Rfl:    HYDROcodone-acetaminophen (NORCO/VICODIN) 5-325 MG tablet, Take 1 tablet by mouth every 8 (eight) hours as needed., Disp: 15 tablet, Rfl: 0   levothyroxine (SYNTHROID, LEVOTHROID) 50 MCG tablet, Take 50 mcg by mouth daily before breakfast., Disp: , Rfl:    metoCLOPramide (REGLAN) 5 MG tablet, Take 5 mg by mouth 4 (four) times daily., Disp: , Rfl:    Multiple Vitamin (MULTIVITAMIN) LIQD, Take 5 mLs by mouth daily., Disp: , Rfl:    Omeprazole (PRILOSEC PO), Take by mouth., Disp: , Rfl:    Ondansetron HCl (ZOFRAN PO), Take by mouth., Disp: , Rfl:    PRAVASTATIN SODIUM PO, Take  by mouth., Disp: , Rfl:    triamterene-hydrochlorothiazide (DYAZIDE) 37.5-25 MG capsule, Take 1 capsule by mouth daily., Disp: , Rfl:   Exam: Current vital signs: BP 135/62 (BP Location: Right Arm)   Pulse (!) 58   Temp 98.7 F (37.1 C) (Oral)   Resp 17   Wt 78.5 kg   SpO2 99%   BMI 32.70 kg/m  Vital signs in last 24 hours: Temp:  [98 F (36.7 C)-98.7 F (37.1 C)] 98.7 F (37.1 C) (05/21 0218) Pulse Rate:  [50-64] 58 (05/21 0433) Resp:  [11-20] 17 (05/21 0433) BP:  (125-158)/(62-86) 135/62 (05/21 0433) SpO2:  [98 %-100 %] 99 % (05/21 0433) Weight:  [78.5 kg] 78.5 kg (05/20 2020) General: Awake alert in no distress HEENT: Normocephalic atraumatic Lungs: Clear Cardiovascular: Regular rhythm and rate Abdomen nondistended nontender Extremities warm well perfused Neurological exam Awake alert oriented x3 No dysarthria No aphasia Cranial nerves: Pupils equal round react light, extraocular movements intact, visual fields full, facial sensation intact, face symmetric, auditory acuity diminished bilaterally, tongue and palate midline. Motor examination with no arm drift but there is definitely weakness on the left hand more prominently in the extensors and the small muscle of the hand.  Right upper and lower extremity are full strength.  Left lower extremity is also nearly full strength. Sensory exam: Intact to touch bilaterally Coordination with dysmetria disproportionate to the arm weakness on the left. NIH stroke scale-2  Labs I have reviewed labs in epic and the results pertinent to this consultation are:  CBC    Component Value Date/Time   WBC 13.5 (H) 07/13/2021 2237   RBC 4.40 07/13/2021 2237   HGB 12.7 07/13/2021 2237   HGB 12.2 05/21/2020 0951   HCT 38.6 07/13/2021 2237   HCT 36.3 05/21/2020 0951   PLT 442 (H) 07/13/2021 2237   MCV 87.7 07/13/2021 2237   MCV 84 05/21/2020 0951   MCH 28.9 07/13/2021 2237   MCHC 32.9 07/13/2021 2237   RDW 14.3 07/13/2021 2237   RDW 13.2 05/21/2020 0951   LYMPHSABS 2.1 07/13/2021 2237   LYMPHSABS 2.0 05/21/2020 0951   MONOABS 1.3 (H) 07/13/2021 2237   EOSABS 0.2 07/13/2021 2237   EOSABS 0.3 05/21/2020 0951   BASOSABS 0.1 07/13/2021 2237   BASOSABS 0.1 05/21/2020 0951    CMP     Component Value Date/Time   NA 136 07/13/2021 2237   K 3.6 07/13/2021 2237   CL 102 07/13/2021 2237   CO2 26 07/13/2021 2237   GLUCOSE 132 (H) 07/13/2021 2237   BUN 22 07/13/2021 2237   CREATININE 1.10 (H) 07/13/2021  2237   CALCIUM 9.3 07/13/2021 2237   PROT 7.9 07/13/2021 2237   PROT 6.9 05/21/2020 0951   ALBUMIN 3.9 07/13/2021 2237   AST 19 07/13/2021 2237   ALT 18 07/13/2021 2237   ALKPHOS 94 07/13/2021 2237   BILITOT 0.4 07/13/2021 2237   GFRNONAA 50 (L) 07/13/2021 2237   GFRAA >60 08/02/2016 1050   Imaging I have reviewed the images obtained: CT head with no acute changes. MRI brain with a small area of acute infarction in the right motor strip corresponding to the left arm and hand representation.  No bleed. CTA head and neck with no emergent LVO.  Assessment: 81 year old with sudden onset of left hand weakness and clumsiness presented for evaluation to the emergency room. NIH stroke scale 2 Outside the window at the time of presentation for IV thrombolysis. Exam not consistent with LVO.  CT  angiography also not consistent with LVO. MRI with hand knob territory stroke Needs full stroke work-up for possible small vessel versus cardioembolic etiology.  Recommendations: Admit to hospitalist Frequent neurochecks Telemetry Aspirin 81+ Plavix 75 Atorvastatin 80 2D echo A1c EpiPen PT OT Speech therapy Permissive hypertension-allow pressures to be as high as 220 mmHg.  Treat only on a as needed basis if above that goal.  Eventual goal of normotension on discharge. Plan discussed with Dr. Dayna Barker, ED provider. Stroke team to follow   -- Amie Portland, MD Neurologist [facility] Pager: [phone]

## 2021-07-14 NOTE — Progress Notes (Signed)
[facility] PLAN OF CARE NOTE Patient: Tiffany Byrd QAE:[id number]   PCP: Juanita Laster, MD DOB: 10-May-1940   DOA: 07/13/2021   DOS: 07/14/2021    Patient was admitted by my colleague earlier on 07/14/2021. I have reviewed the H&P as well as assessment and plan and agree with the same. Important changes in the plan are listed below.  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 Author: Lynden Oxford, MD [facility] 07/14/2021 7:10 PM   If 7PM-7AM, please contact night-coverage at [email or web address]

## 2021-07-15 ENCOUNTER — Encounter: Payer: Self-pay | Admitting: *Deleted

## 2021-07-15 ENCOUNTER — Other Ambulatory Visit: Payer: Self-pay | Admitting: Student

## 2021-07-15 DIAGNOSIS — I639 Cerebral infarction, unspecified: Secondary | ICD-10-CM | POA: Diagnosis not present

## 2021-07-15 DIAGNOSIS — I4891 Unspecified atrial fibrillation: Secondary | ICD-10-CM

## 2021-07-15 DIAGNOSIS — Z7189 Other specified counseling: Secondary | ICD-10-CM

## 2021-07-15 MED ORDER — PANTOPRAZOLE SODIUM 40 MG PO TBEC: 40.0000 mg | DELAYED_RELEASE_TABLET | Freq: Every day | ORAL | 0 refills | Status: AC

## 2021-07-15 MED ORDER — CLOPIDOGREL BISULFATE 75 MG PO TABS: 75.0000 mg | ORAL_TABLET | Freq: Every day | ORAL | 0 refills | Status: AC

## 2021-07-15 MED ORDER — ASPIRIN 81 MG PO TBEC: 81.0000 mg | DELAYED_RELEASE_TABLET | Freq: Every day | ORAL | 0 refills | Status: AC

## 2021-07-15 MED ORDER — ATORVASTATIN CALCIUM 80 MG PO TABS: 80.0000 mg | ORAL_TABLET | Freq: Every day | ORAL | 0 refills | Status: AC

## 2021-07-15 NOTE — Care Management Obs Status (Signed)
MEDICARE OBSERVATION STATUS NOTIFICATION   Patient Details  Name: Tiffany Byrd MRN: [id number] Date of Birth: Sep 03, 1940   Medicare Observation Status Notification Given:  Yes    Kermit Balo, RN 07/15/2021, 11:06 AM

## 2021-07-15 NOTE — Progress Notes (Unsigned)
Ordered 30 day event monitor for further evaluation of stroke at the request of Neurology team. Patient has never been seen by our office so we will also arrange a New Patient visit with Dr. Marlou Porch (doctor of the day today) to review monitor results.  Darreld Mclean, PA-C 07/15/2021 1:14 PM

## 2021-07-15 NOTE — Evaluation (Signed)
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 PMH includes GERD, HLD, and thyroid disease.   Clinical Impression   Pt independent at baseline with ADLs and uses cane for functional mobility. Pt currently supervision-mod I for ADLs, mod I for bed mobility and transfers with cane. Pt presenting with decreased strength and coordination in LUE, pt reporting numbness in L hand and occasionally overshooting/undershooting and dropping things when reaching/grasping objects. Administered squeeze ball and verbally reviewed coordination exercises, encouraged mobility and use of LUE with ADL tasks, pt verbalized understanding. Pt presenting with impairments listed below, will follow acutely. Recommend OP OT at d/c, pt preference for OP therapy at [facility].     Recommendations for follow up therapy are one component of a multi-disciplinary discharge planning process, led by the attending physician.  Recommendations may be updated based on patient status, additional functional criteria and insurance authorization.   Follow Up Recommendations  Outpatient OT (pt requesting outpt therapy at [facility])    Assistance Recommended at Discharge PRN  Patient can return home with the following Direct supervision/assist for medications management;Direct supervision/assist for financial management;Assistance with cooking/housework;Assist for transportation    Functional Status Assessment  Patient has had a recent decline in their functional status and demonstrates the ability to make significant improvements in function in a reasonable and predictable amount of time.  Equipment Recommendations  None recommended by OT (pt has all needed  DME)    Recommendations for Other Services       Precautions / Restrictions Precautions Precautions: Fall Restrictions Weight Bearing Restrictions: No      Mobility Bed Mobility Overal bed mobility: Modified Independent             General bed mobility comments: + use of rail to sit EOB    Transfers Overall transfer level: Modified independent Equipment used: Straight cane               General transfer comment: increased time, slow walking speed, per pt this is baseline      Balance Overall balance assessment: Mild deficits observed, not formally tested                                         ADL either performed or assessed with clinical judgement   ADL Overall ADL's : Needs assistance/impaired Eating/Feeding: Set up;Sitting   Grooming: Modified independent;Standing   Upper Body Bathing: Sitting;Supervision/ safety   Lower Body Bathing: Sitting/lateral leans;Sit to/from stand;Supervison/ safety   Upper Body Dressing : Sitting;Supervision/safety   Lower Body Dressing: Supervision/safety;Sitting/lateral leans;Sit to/from stand   Toilet Transfer: Supervision/safety;Ambulation;Regular Toilet (cane) Toilet Transfer Details (indicate cue type and reason): simulated in room/hall Toileting- Clothing Manipulation and Hygiene: Supervision/safety;Sitting/lateral lean;Sit to/from stand       Functional mobility during ADLs: Supervision/safety;Cane       Vision Patient Visual Report: No change from baseline Vision Assessment?: No apparent visual deficits     Perception     Praxis      Pertinent Vitals/Pain Pain Assessment Pain Assessment: No/denies pain     Hand Dominance Right   Extremity/Trunk Assessment Upper Extremity Assessment Upper Extremity Assessment: LUE deficits/detail (4/5  strength, ROM symmetrical bilaterally) LUE Deficits / Details: reporting numbness in L hand, however is able to use functionally, decreased  coordination LUE Sensation: decreased proprioception;decreased light touch LUE Coordination: decreased fine motor   Lower Extremity Assessment Lower Extremity Assessment: Overall WFL for tasks assessed   Cervical / Trunk Assessment Cervical / Trunk Assessment: Normal   Communication Communication Communication: HOH;Other (comment) (has hearing aids)   Cognition Arousal/Alertness: Awake/alert Behavior During Therapy: WFL for tasks assessed/performed Overall Cognitive Status: Within Functional Limits for tasks assessed                                 General Comments: A & O x4; follows commands appropriately, needing repetition intermittently for task initiation, decreased STM when administering SBT, scoring 3 indicative of normal cognition     General Comments  VSS on RA, administered squeeze ball and educated pt on coordination exercises for LUE    Exercises Exercises: Other exercises Other Exercises Other Exercises: squeeze ball Other Exercises: opposition Other Exercises: isolated finger elevation   Shoulder Instructions      Home Living Family/patient expects to be discharged to:: Private residence Living Arrangements: Children (son lives with her) Available Help at Discharge: Family;Available PRN/intermittently Type of Home: House Home Access: Ramped entrance     Home Layout: One level     Bathroom Shower/Tub: Tub/shower unit;Curtain         Home Equipment: Cane - single point;BSC/3in1;Wheelchair - manual;Grab bars - tub/shower          Prior Functioning/Environment Prior Level of Function : Independent/Modified Independent;Driving             Mobility Comments: uses cane ADLs Comments: does IADLs, son assists with household chores as well, pt does own finances and med mgmt        OT Problem List: Decreased strength;Decreased range of motion;Decreased coordination;Decreased cognition;Impaired sensation      OT  Treatment/Interventions: Self-care/ADL training;Therapeutic exercise;DME and/or AE instruction;Therapeutic activities;Visual/perceptual remediation/compensation;Patient/family education    OT Goals(Current goals can be found in the care plan section) Acute Rehab OT Goals Patient Stated Goal: none stated OT Goal Formulation: With patient Time For Goal Achievement: 07/29/21 Potential to Achieve Goals: Good ADL Goals Pt/caregiver will Perform Home Exercise Program: Increased ROM;Increased strength;Left upper extremity;Independently Additional ADL Goal #1: Pt will follow 3 step trailmaking task accuratly in prep for ADLs.  OT Frequency: Min 2X/week    Co-evaluation              AM-PAC OT "6 Clicks" Daily Activity     Outcome Measure Help from another person eating meals?: A Little Help from another person taking care of personal grooming?: None Help from another person toileting, which includes using toliet, bedpan, or urinal?: None Help from another person bathing (including washing, rinsing, drying)?: None Help from another person to put on and taking off regular upper body clothing?: None Help from another person to put on and taking off regular lower body clothing?: None 6 Click Score: 23   End of Session Equipment Utilized During Treatment: Gait belt;Other (comment) (cane) Nurse Communication: Mobility status  Activity Tolerance: Patient tolerated treatment well Patient left: in chair;with call bell/phone within reach;with chair alarm set  OT Visit Diagnosis: Muscle weakness (generalized) (M62.81)                Time: [id number] OT Time Calculation (min): 22 min Charges:  OT General Charges $OT Visit: 1  Visit OT Evaluation $OT Eval Moderate Complexity: 1 [address] OTD, OTR/L Acute Rehab [phone]   Mayer Masker 07/15/2021, 8:47 AM

## 2021-07-15 NOTE — Progress Notes (Signed)
SLP Cancellation Note  Patient Details Name: Tiffany Byrd MRN: [id number] DOB: 1940/09/20   Cancelled treatment:       Reason Eval/Treat Not Completed: SLP screened, no needs identified, will sign off   Jesusa Stenerson, Katherene Ponto 07/15/2021, 12:01 PM

## 2021-07-15 NOTE — TOC Transition Note (Signed)
Transition of Care [facility]) - CM/SW Discharge Note   Patient Details  Name: Tiffany Byrd MRN: [id number] Date of Birth: December 23, 1940  Transition of [facility]) CM/SW Contact:  Kermit Balo, RN Phone Number: 07/15/2021, 11:14 AM   Clinical Narrative:    Patient is from home with her son. She states he is in and out but there in the am's and evenings. She was driving prior to admission. She manages her own medications without any issues.  DME: cane/ walker/ 3 in 1/ wheelchair. Recommendations for outpatient therapy. Pt has attended at [facility] in the past. However they dont offer OT. Pt agreeable to attend at [facility] across the street from [facility]. Information on the AVS.  Pt has transport home.   Final next level of care: OP Rehab Barriers to Discharge: No Barriers Identified   Patient Goals and CMS Choice     Choice offered to / list presented to : Patient  Discharge Placement                       Discharge Plan and Services                                     Social Determinants of Health (SDOH) Interventions     Readmission Risk Interventions     View : No data to display.

## 2021-07-15 NOTE — Progress Notes (Signed)
Patient ID: Tiffany Byrd, female   DOB: Apr 17, 1940, 81 y.o.   MRN: [id number] Patient enrolled for [employer] to ship a 30 day cardiac event monitor to her home.  Letter with instructions mailed to patient.

## 2021-07-15 NOTE — IPAL (Addendum)
Interdisciplinary Goals of Care Family Meeting  Date carried out: 07/15/2021  Location of the meeting: Bedside  Member's involved: Physician  Durable Power of Attorney or acting medical decision maker: grand daughter (not present). She has HCPOA document and living will  Discussion: We discussed goals of care for [employer] .   She would like to remain DNR DNI She does not want to use NIV.  She does not want to use any kind of feeding tube, even in a situation where that will mean end of life.  Code status: Full DNR  Disposition: Home  Time spent for the meeting: 20 mins   Tiffany Mull, MD 07/15/2021,

## 2021-07-15 NOTE — Plan of Care (Signed)
  Problem: Education: Goal: Knowledge of disease or condition will improve Outcome: Progressing Goal: Knowledge of secondary prevention will improve (SELECT ALL) Outcome: Progressing   

## 2021-07-15 NOTE — Plan of Care (Signed)

## 2021-07-16 NOTE — Discharge Summary (Signed)
Physician Discharge Summary   Patient: Tiffany Byrd MRN: [id number] DOB: Mar 24, 1940  Admit date:     07/13/2021  Discharge date: 07/15/2021  Discharge Physician: Lynden Oxford  PCP: Juanita Laster, MD  Recommendations at discharge: Follow-up with PCP in 1 week. Follow-up with instructions recommended.  Discharge Diagnoses: Active Problems:   Acute stroke due to ischemia [facility])   Essential hypertension   Mixed hyperlipidemia   GERD without esophagitis  Hospital Course: 81 year old female with past medical history of HLD, HTN, hypothyroidism, MGUS presented to the hospital with complaints of left-sided weakness found to have acute CVA.  Neurology was consulted.  Stroke work-up was completed.  Outpatient follow-up recommended.  Assessment and Plan:  Acute right MCA territory infarct  likely related small vessel disease CTA head & neck Moderate narrowing of the right vertebral artery origin MRI   small area of acute infarction in the right motor strip  2D Echo 55-60%. No PFO. LDL 93, pravastatin changed to Lipitor. HgbA1c 6.2. No antithrombotic prior to admission, now on aspirin 81 mg daily and clopidogrel 75 mg daily for 3 weeks and then aspirin 81mg   Neurology recommended cardiac monitor on discharge.  Hypertension Home meds:  atenolol Stable   Hyperlipidemia Home meds:  Pravastatin 10mg  LDL 93, goal < 70 Change to Atorvastatin 80mg    Obesity, Body mass index is 32.7 kg/m.  associated with increased stroke risk, recommend weight loss, diet and exercise as appropriate    GERD PPI continue  Consultants: Neurology Procedures performed:  Echocardiogram DISCHARGE MEDICATION: Allergies as of 07/15/2021       Reactions   Latex    Penicillins    Hydrocodone-acetaminophen Anxiety        Medication List     STOP taking these medications    celecoxib 200 MG capsule Commonly known as: CeleBREX   DULoxetine 30 MG capsule Commonly known as: CYMBALTA    HYDROcodone-acetaminophen 5-325 MG tablet Commonly known as: NORCO/VICODIN   ibuprofen 200 MG tablet Commonly known as: ADVIL   metoCLOPramide 5 MG tablet Commonly known as: REGLAN   pravastatin 10 MG tablet Commonly known as: PRAVACHOL   PRILOSEC PO Replaced by: pantoprazole 40 MG tablet       TAKE these medications    acetaminophen 500 MG tablet Commonly known as: TYLENOL Take 500 mg by mouth every 6 (six) hours as needed for moderate pain.   aspirin EC 81 MG tablet Take 1 tablet (81 mg total) by mouth daily. Swallow whole.   atenolol 25 MG tablet Commonly known as: TENORMIN Take 25 mg by mouth at bedtime.   atorvastatin 80 MG tablet Commonly known as: LIPITOR Take 1 tablet (80 mg total) by mouth daily.   cholecalciferol 10 MCG (400 UNIT) Tabs tablet Commonly known as: VITAMIN D3 Take 400 Units by mouth.   clopidogrel 75 MG tablet Commonly known as: PLAVIX Take 1 tablet (75 mg total) by mouth daily for 20 days.   FLEXERIL PO Take 1 tablet by mouth daily as needed (muscle spasms).   levothyroxine 75 MCG tablet Commonly known as: SYNTHROID Take 75 mcg by mouth daily before breakfast.   multivitamin Liqd Take 5 mLs by mouth daily.   pantoprazole 40 MG tablet Commonly known as: PROTONIX Take 1 tablet (40 mg total) by mouth daily. Replaces: PRILOSEC PO   pregabalin 25 MG capsule Commonly known as: LYRICA Take 25 mg by mouth 2 (two) times daily.   triamterene-hydrochlorothiazide 37.5-25 MG capsule Commonly known as: DYAZIDE Take 1 capsule  by mouth daily.        Follow-up Information     [facility] Follow up.   Why: The outpatient rehab will contact you for the first appointment Contact information: [address] [place] [address] [phone]         Juanita Laster, MD. Schedule an appointment as soon as possible for a visit in 1 week(s).   Specialty: Family Medicine Contact  information: [address] [facility] [place] [address] [phone]         [facility]. Call.   Specialty: Cardiology Why: As needed for heart monitor. Contact information: [address] [place] [address] [phone]               Disposition: Home Diet recommendation: Cardiac diet  Discharge Exam: Vitals:   07/15/21 0513 07/15/21 0712 07/15/21 0900 07/15/21 1100  BP: 125/63 123/60  (!) 156/89  Pulse: 62 64  (!) 58  Resp:  Temp: 97.9 F (36.6 C) 97.8 F (36.6 C)  97.8 F (36.6 C)  TempSrc: Oral Oral  Oral  SpO2: 100% 98%  99%  Weight:      Height:       General: Appear in no distress; no visible Abnormal Neck Mass Or lumps, Conjunctiva normal Cardiovascular: S1 and S2 Present, no Murmur, Respiratory: good respiratory effort, Bilateral Air entry present and CTA, no Crackles, no wheezes Abdomen: Bowel Sound present, Non tender  Extremities: no Pedal edema Neurology: alert and oriented to time, place, and person  Gait not checked due to patient safety concerns Filed Weights   07/13/21 2020  Weight: 78.5 kg   Condition at discharge: stable  The results of significant diagnostics from this hospitalization (including imaging, microbiology, ancillary and laboratory) are listed below for reference.   Imaging Studies: CT Angio Head W or Wo Contrast  Result Date: 07/14/2021 CLINICAL DATA:  Left hand heaviness EXAM: CT ANGIOGRAPHY HEAD AND NECK TECHNIQUE: Multidetector CT imaging of the head and neck was performed using the standard protocol during bolus administration of intravenous contrast. Multiplanar CT image reconstructions and MIPs were obtained to evaluate the vascular anatomy. Carotid stenosis measurements (when applicable) are obtained utilizing NASCET criteria, using the distal internal carotid diameter as the denominator. RADIATION DOSE REDUCTION: This exam was  performed according to the departmental dose-optimization program which includes automated exposure control, adjustment of the mA and/or kV according to patient size and/or use of iterative reconstruction technique. CONTRAST:  OMNIPAQUE IOHEXOL 350 MG/ML SOLN COMPARISON:  None Available. FINDINGS: CT HEAD FINDINGS Brain: There is no mass, hemorrhage or extra-axial collection. There is generalized atrophy without lobar predilection. There is no acute or chronic infarction. The brain parenchyma is normal. Skull: The visualized skull base, calvarium and extracranial soft tissues are normal. Sinuses/Orbits: No fluid levels or advanced mucosal thickening of the visualized paranasal sinuses. No mastoid or middle ear effusion. The orbits are normal. CTA NECK FINDINGS SKELETON: There is no bony spinal canal stenosis. No lytic or blastic lesion. OTHER NECK: Normal pharynx, larynx and major salivary glands. No cervical lymphadenopathy. Unremarkable thyroid gland. UPPER CHEST: No pneumothorax or pleural effusion. No nodules or masses. AORTIC ARCH: There is calcific atherosclerosis of the aortic arch. There is no aneurysm, dissection or hemodynamically significant stenosis of the visualized portion of the aorta. Conventional 3 vessel aortic branching pattern. The visualized proximal subclavian  arteries are widely patent. RIGHT CAROTID SYSTEM: No dissection, occlusion or aneurysm. Mild atherosclerotic calcification at the carotid bifurcation without hemodynamically significant stenosis. LEFT CAROTID SYSTEM: No dissection, occlusion or aneurysm. Mild atherosclerotic calcification at the carotid bifurcation without hemodynamically significant stenosis. VERTEBRAL ARTERIES: Left dominant configuration. Moderate narrowing of the right vertebral artery origin. Otherwise normal vertebral arteries. CTA HEAD FINDINGS POSTERIOR CIRCULATION: --Vertebral arteries: Normal V4 segments. --Inferior cerebellar arteries: Normal. --Basilar  artery: Normal. --Superior cerebellar arteries: Normal. --Posterior cerebral arteries (PCA): Normal. ANTERIOR CIRCULATION: --Intracranial internal carotid arteries: Normal. --Anterior cerebral arteries (ACA): Normal. Both A1 segments are present. Patent anterior communicating artery (a-comm). --Middle cerebral arteries (MCA): Normal. VENOUS SINUSES: As permitted by contrast timing, patent. ANATOMIC VARIANTS: None Review of the MIP images confirms the above findings. IMPRESSION: 1. No emergent large vessel occlusion or high-grade stenosis of the intracranial arteries. 2. Moderate narrowing of the right vertebral artery origin. 3. Aortic Atherosclerosis (ICD10-I70.0). Electronically Signed   By: Deatra Robinson M.D.   On: 07/14/2021 00:15   CT Angio Neck W and/or Wo Contrast  Result Date: 07/14/2021 CLINICAL DATA:  Left hand heaviness EXAM: CT ANGIOGRAPHY HEAD AND NECK TECHNIQUE: Multidetector CT imaging of the head and neck was performed using the standard protocol during bolus administration of intravenous contrast. Multiplanar CT image reconstructions and MIPs were obtained to evaluate the vascular anatomy. Carotid stenosis measurements (when applicable) are obtained utilizing NASCET criteria, using the distal internal carotid diameter as the denominator. RADIATION DOSE REDUCTION: This exam was performed according to the departmental dose-optimization program which includes automated exposure control, adjustment of the mA and/or kV according to patient size and/or use of iterative reconstruction technique. CONTRAST:  OMNIPAQUE IOHEXOL 350 MG/ML SOLN COMPARISON:  None Available. FINDINGS: CT HEAD FINDINGS Brain: There is no mass, hemorrhage or extra-axial collection. There is generalized atrophy without lobar predilection. There is no acute or chronic infarction. The brain parenchyma is normal. Skull: The visualized skull base, calvarium and extracranial soft tissues are normal. Sinuses/Orbits: No fluid  levels or advanced mucosal thickening of the visualized paranasal sinuses. No mastoid or middle ear effusion. The orbits are normal. CTA NECK FINDINGS SKELETON: There is no bony spinal canal stenosis. No lytic or blastic lesion. OTHER NECK: Normal pharynx, larynx and major salivary glands. No cervical lymphadenopathy. Unremarkable thyroid gland. UPPER CHEST: No pneumothorax or pleural effusion. No nodules or masses. AORTIC ARCH: There is calcific atherosclerosis of the aortic arch. There is no aneurysm, dissection or hemodynamically significant stenosis of the visualized portion of the aorta. Conventional 3 vessel aortic branching pattern. The visualized proximal subclavian arteries are widely patent. RIGHT CAROTID SYSTEM: No dissection, occlusion or aneurysm. Mild atherosclerotic calcification at the carotid bifurcation without hemodynamically significant stenosis. LEFT CAROTID SYSTEM: No dissection, occlusion or aneurysm. Mild atherosclerotic calcification at the carotid bifurcation without hemodynamically significant stenosis. VERTEBRAL ARTERIES: Left dominant configuration. Moderate narrowing of the right vertebral artery origin. Otherwise normal vertebral arteries. CTA HEAD FINDINGS POSTERIOR CIRCULATION: --Vertebral arteries: Normal V4 segments. --Inferior cerebellar arteries: Normal. --Basilar artery: Normal. --Superior cerebellar arteries: Normal. --Posterior cerebral arteries (PCA): Normal. ANTERIOR CIRCULATION: --Intracranial internal carotid arteries: Normal. --Anterior cerebral arteries (ACA): Normal. Both A1 segments are present. Patent anterior communicating artery (a-comm). --Middle cerebral arteries (MCA): Normal. VENOUS SINUSES: As permitted by contrast timing, patent. ANATOMIC VARIANTS: None Review of the MIP images confirms the above findings. IMPRESSION: 1. No emergent large vessel occlusion or high-grade stenosis of the intracranial arteries. 2. Moderate narrowing of the right vertebral artery  origin.  3. Aortic Atherosclerosis (ICD10-I70.0). Electronically Signed   By: Deatra RobinsonKevin  Herman M.D.   On: 07/14/2021 00:15   MR BRAIN WO CONTRAST  Result Date: 07/14/2021 CLINICAL DATA:  81 year old female with neurologic deficit, left upper extremity heaviness. EXAM: MRI HEAD WITHOUT CONTRAST TECHNIQUE: Multiplanar, multiecho pulse sequences of the brain and surrounding structures were obtained without intravenous contrast. COMPARISON:  CTA head and neck 2347 hours. [facility]. FINDINGS: Brain: Small cortical and subcortical white matter restricted diffusion at the superior right motor strip, left upper extremity representation area (series 5 images 81 through 84. Minimal if any associated T2 and FLAIR hyperintensity. No other restricted diffusion. No midline shift, mass effect, evidence of mass lesion, ventriculomegaly, extra-axial collection or acute intracranial hemorrhage. Cervicomedullary junction and pituitary are within normal limits. Outside of the superior perirolandic cortex gray and white matter signal is stable since last year and within normal limits for age. No chronic cerebral blood products identified. Vascular: Major intracranial vascular flow voids are stable since last year. Skull and upper cervical spine: Visualized bone marrow signal is within normal limits. Partially visible cervical spine degeneration. Sinuses/Orbits: Stable, negative. Other: Mastoids remain clear. Visible internal auditory structures appear normal. Negative visible scalp and face. IMPRESSION: 1. Positive for small acute cortical and subcortical white matter infarcts at the superior right motor strip, left upper extremity representation area. No associated hemorrhage or mass effect. 2. Elsewhere stable from last year and normal for age noncontrast MRI appearance of the brain. Electronically Signed   By: Odessa FlemingH  Hall M.D.   On: 07/14/2021 04:43   US LIMITED JOINT SPACE STRUCTURES LOW  LEFT  Result Date: 07/10/2021 Aspiration/Injection Procedure Note Vinnie LevelSylvia Marcel Feb 01, 1941   Procedure: Aspiration and injection Indications: left knee pain   Procedure Details Consent: Risks of procedure as well as the alternatives and risks of each were explained to the (patient/caregiver).  Consent for procedure obtained. Time Out: Verified patient identification, verified procedure, site/side was marked, verified correct patient position, special equipment/implants available, medications/allergies/relevent history reviewed, required imaging and test results available.  Performed.  The area was cleaned with iodine and alcohol swabs.    The left knee superior lateral suprapatellar pouch was injected using 3 cc of 1% lidocaine on a 25-gauge 1-1/2 inch needle.  An 18-gauge 1-1/2 needle was used to achieve aspiration.  The syringe was switched and a mixture containing 5 cc's of 32 mg Zilretta and 4 cc's of 0.25% bupivacaine was injected.  Ultrasound was used. Images were obtained in long views showing the injection.   Amount of Fluid Aspirated: 15mL Character of Fluid: clear and straw colored Fluid was sent for: n/a   A sterile dressing was applied.   Patient did tolerate procedure well  ECHOCARDIOGRAM COMPLETE BUBBLE STUDY  Result Date: 07/14/2021    ECHOCARDIOGRAM REPORT   Patient Name:   Vinnie LevelSYLVIA Molinelli Date of Exam: 07/14/2021 Medical Rec #:  [id number]    Height:       61.0 in Accession #:    [id number]   Weight:       173.1 lb Date of Birth:  Feb 01, 1941   BSA:          1.776 m Patient Age:    80 years     BP:           107/58 mmHg Patient Gender: F            HR:           62  bpm. Exam Location:  Inpatient Procedure: 2D Echo, Cardiac Doppler, Color Doppler and Saline Contrast Bubble            Study Indications:    Stroke  History:        Patient has no prior history of Echocardiogram examinations.                 Risk Factors:Hypertension.  Sonographer:    Eduard Roux Referring Phys: [id number] Marinda Elk  Sonographer Comments: Image acquisition challenging due to respiratory motion. IMPRESSIONS  1. Basal inferior hypokinesis. . Left ventricular ejection fraction, by estimation, is 55 to 60%. The left ventricle has normal function.  2. Right ventricular systolic function is low normal. The right ventricular size is normal. There is normal pulmonary artery systolic pressure.  3. The mitral valve is normal in structure. Mild mitral valve regurgitation.  4. Tricuspid valve regurgitation is mild to moderate.  5. The aortic valve is normal in structure. Aortic valve regurgitation is not visualized. FINDINGS  Left Ventricle: Basal inferior hypokinesis. Left ventricular ejection fraction, by estimation, is 55 to 60%. The left ventricle has normal function. The left ventricular internal cavity size was normal in size. There is no left ventricular hypertrophy. Right Ventricle: The right ventricular size is normal. Right vetricular wall thickness was not assessed. Right ventricular systolic function is low normal. There is normal pulmonary artery systolic pressure. The tricuspid regurgitant velocity is 2.32 m/s, and with an assumed right atrial pressure of 5 mmHg, the estimated right ventricular systolic pressure is 26.5 mmHg. Left Atrium: Left atrial size was normal in size. Right Atrium: Right atrial size was normal in size. Pericardium: There is no evidence of pericardial effusion. Mitral Valve: The mitral valve is normal in structure. Mild mitral annular calcification. Mild mitral valve regurgitation. Tricuspid Valve: The tricuspid valve is normal in structure. Tricuspid valve regurgitation is mild to moderate. Aortic Valve: The aortic valve is normal in structure. Aortic valve regurgitation is not visualized. Aortic valve peak gradient measures 3.6 mmHg. Pulmonic Valve: The pulmonic valve was normal in structure. Pulmonic valve regurgitation is trivial. Aorta: The aortic root and ascending aorta are structurally  normal, with no evidence of dilitation. IAS/Shunts: No atrial level shunt detected by color flow Doppler. Agitated saline contrast was given intravenously to evaluate for intracardiac shunting.  LEFT VENTRICLE PLAX 2D LVIDd:         4.00 cm   Diastology LVIDs:         2.80 cm   LV e' medial:    7.01 cm/s LV PW:         0.90 cm   LV E/e' medial:  10.6 LV IVS:        0.90 cm   LV e' lateral:   7.53 cm/s LVOT diam:     1.90 cm   LV E/e' lateral: 9.9 LV SV:         39 LV SV Index:   22 LVOT Area:     2.84 cm  RIGHT VENTRICLE             IVC RV Basal diam:  2.70 cm     IVC diam: 1.10 cm RV S prime:     15.80 cm/s TAPSE (M-mode): 2.2 cm LEFT ATRIUM             Index        RIGHT ATRIUM          Index LA diam:  3.10 cm 1.75 cm/m   RA Area:     7.94 cm LA Vol (A2C):   38.0 ml 21.39 ml/m  RA Volume:   13.40 ml 7.54 ml/m LA Vol (A4C):   40.3 ml 22.69 ml/m LA Biplane Vol: 39.8 ml 22.41 ml/m  AORTIC VALVE                 PULMONIC VALVE AV Area (Vmax): 2.17 cm     PV Vmax:       0.71 m/s AV Vmax:        94.70 cm/s   PV Peak grad:  2.0 mmHg AV Peak Grad:   3.6 mmHg LVOT Vmax:      72.50 cm/s LVOT Vmean:     40.200 cm/s LVOT VTI:       0.139 m  AORTA Ao Root diam: 3.30 cm Ao Asc diam:  3.50 cm MITRAL VALVE               TRICUSPID VALVE MV Area (PHT): 3.42 cm    TR Peak grad:   21.5 mmHg MV Decel Time: 222 msec    TR Vmax:        232.00 cm/s MV E velocity: 74.40 cm/s MV A velocity: 94.10 cm/s  SHUNTS MV E/A ratio:  0.79        Systemic VTI:  0.14 m                            Systemic Diam: 1.90 cm Dietrich Pates MD Electronically signed by Dietrich Pates MD Signature Date/Time: 07/14/2021/3:04:21 PM    Final     Microbiology: No results found for this or any previous visit.  Labs: CBC: Recent Labs  Lab 07/13/21 2237 07/14/21 0910  WBC 13.5* 11.8*  NEUTROABS 9.8* 8.2*  HGB 12.7 12.8  HCT 38.6 39.4  MCV 87.7 87.9  PLT 442* 425*   Basic Metabolic Panel: Recent Labs  Lab 07/13/21 2237 07/14/21 0910  NA 136  137  K 3.6 4.2  CL 102 105  CO2 26 23  GLUCOSE 132* 103*  BUN 22 16  CREATININE 1.10* 1.00  CALCIUM 9.3 9.2  MG  --  1.9   Liver Function Tests: Recent Labs  Lab 07/13/21 2237 07/14/21 0910  AST 19 19  ALT 18 17  ALKPHOS 94 96  BILITOT 0.4 0.8  PROT 7.9 7.5  ALBUMIN 3.9 3.8   CBG: No results for input(s): GLUCAP in the last 168 hours.  Discharge time spent: greater than 30 minutes.  Signed: Lynden Oxford, MD [facility] 07/15/2021

## 2021-07-26 ENCOUNTER — Ambulatory Visit (INDEPENDENT_AMBULATORY_CARE_PROVIDER_SITE_OTHER): Payer: Medicare Other

## 2021-07-26 DIAGNOSIS — I4891 Unspecified atrial fibrillation: Secondary | ICD-10-CM | POA: Diagnosis not present

## 2021-07-26 DIAGNOSIS — I6389 Other cerebral infarction: Secondary | ICD-10-CM

## 2021-07-26 DIAGNOSIS — I639 Cerebral infarction, unspecified: Secondary | ICD-10-CM

## 2021-07-29 DIAGNOSIS — I6930 Unspecified sequelae of cerebral infarction: Secondary | ICD-10-CM | POA: Insufficient documentation

## 2021-07-29 DIAGNOSIS — I129 Hypertensive chronic kidney disease with stage 1 through stage 4 chronic kidney disease, or unspecified chronic kidney disease: Secondary | ICD-10-CM | POA: Insufficient documentation

## 2021-08-23 ENCOUNTER — Telehealth: Payer: Self-pay | Admitting: Student

## 2021-08-23 NOTE — Telephone Encounter (Signed)
Patient called stating she has completed the 30 days of monitoring and has removed and will be returning the monitor.

## 2021-08-23 NOTE — Telephone Encounter (Signed)
No follow up call needed.  Pt had completed monitor.

## 2021-09-02 ENCOUNTER — Telehealth: Payer: Self-pay | Admitting: Student

## 2021-09-02 NOTE — Telephone Encounter (Signed)
Corrin Parker, PA-C  08/29/2021  3:52 PM EDT     Please notify patient of results: Normal monitor. No evidence of atrial fibrillation to explain recent stroke. Patient should keep follow-up appointment with Dr. Anne Fu scheduled for 09/24/2021.    Thank you!     Spoke with pt regarding monitor results. Pt would like a copy of results printed and mailed to her address on file. Will mail results to pt. Pt verbalizes understanding.

## 2021-09-02 NOTE — Telephone Encounter (Signed)
Pt returning nurses call regarding results. Please advise 

## 2021-09-19 ENCOUNTER — Encounter: Payer: Self-pay | Admitting: Neurology

## 2021-09-19 ENCOUNTER — Ambulatory Visit: Payer: Medicare Other | Admitting: Neurology

## 2021-09-19 VITALS — BP 138/62 | HR 66 | Ht 61.0 in | Wt 174.0 lb

## 2021-09-19 DIAGNOSIS — R29898 Other symptoms and signs involving the musculoskeletal system: Secondary | ICD-10-CM

## 2021-09-19 DIAGNOSIS — I639 Cerebral infarction, unspecified: Secondary | ICD-10-CM

## 2021-09-19 DIAGNOSIS — E782 Mixed hyperlipidemia: Secondary | ICD-10-CM | POA: Diagnosis not present

## 2021-09-19 NOTE — Progress Notes (Signed)
[facility] [address] [place]. [place] [address] [phone]       OFFICE CONSULT NOTE  Tiffany Byrd Date of Birth:  1940/04/09 Medical Record Number:  [id number]   Referring MD: Lynden Oxford  Reason for Referral: Stroke  HPI: Tiffany Byrd is a 81 year old lady seen today for initial office consultation visit for stroke.  History is obtained from the patient and review of electronic medical records and I have also personally reviewed pertinent available imaging films in PACS.  She has past medical history of thyroid disease, hypertension, hyperlipidemia and gastroesophageal reflux disease.  She presented to [facility] emergency room with sudden onset of left hand weakness, heaviness and clumsiness restarted at 11:30 AM on 07/13/2021.  She woke up that morning feeling fine and symptoms appear later on.  The ED provider note noticed some ataxia on the left and hence stroke work-up was done.  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  MRI scan showed a small right frontal motor strip embolic infarct.  LDL cholesterol is 93 mg percent hemoglobin A1c of 6.2.  She was started on aspirin and Plavix for 3 weeks followed by aspirin alone.  She was also started on Lipitor 80 mg daily which is tolerating well without side effects and follow-up lipid profile on 09/12/2021 showed improvement with LDL cholesterol down to 57 mg percent.  She also wore a 30-day heart monitor from 07/26/2021 to 08/28/2021 which was negative for atrial fibrillation or cardiac arrhythmias.  Patient states left-sided weakness has improved she still has some minor paresthesias at the tip of the fingers but she has good strength.  She is finished outpatient physical and Occupational Therapy.  She is tolerating aspirin well with only minor bruising and no bleeding.  She is still doing some home  exercises regularly.  Her primary care physician increase the dose of Lyrica to 75 mg which seems to have helped with the left-sided paresthesias as well.  She has no complaints today.  She has no prior history of stroke TIAs, migraines, seizures or any neurological problems.  She has arthritis in her knee with severe pain and walks with a walker.  ROS:   14 system review of systems is positive for numbness, weakness, bruising, knee pain, difficulty walking all other systems negative  PMH:  Past Medical History:  Diagnosis Date   GERD (gastroesophageal reflux disease)    Hyperlipidemia    Thyroid disease     Social History:  Social History   Socioeconomic History   Marital status: Widowed    Spouse name: Not on file   Number of children: Not on file   Years of education: Not on file   Highest education level: Not on file  Occupational History   Not on file  Tobacco Use   Smoking status: Never   Smokeless tobacco: Never  Substance and Sexual Activity   Alcohol use: Not on file   Drug use: Not on file   Sexual activity: Not on file  Other Topics Concern   Not on file  Social History Narrative   Not on file   Social Determinants of Health   Financial Resource Strain: Not on file  Food Insecurity: Not on file  Transportation Needs: Not on file  Physical Activity: Not on file  Stress: Not on file  Social Connections: Not on file  Intimate  Partner Violence: Not on file    Medications:   Current Outpatient Medications on File Prior to Visit  Medication Sig Dispense Refill   acetaminophen (TYLENOL) 500 MG tablet Take 500 mg by mouth every 6 (six) hours as needed for moderate pain.     aspirin EC 81 MG tablet Take 1 tablet (81 mg total) by mouth daily. Swallow whole. 120 tablet 0   atenolol (TENORMIN) 25 MG tablet Take 25 mg by mouth at bedtime.     atorvastatin (LIPITOR) 80 MG tablet Take 1 tablet (80 mg total) by mouth daily. 30 tablet 0   cholecalciferol (VITAMIN D)  400 units TABS tablet Take 400 Units by mouth.     Cyclobenzaprine HCl (FLEXERIL PO) Take 1 tablet by mouth daily as needed (muscle spasms).     levothyroxine (SYNTHROID) 75 MCG tablet Take 75 mcg by mouth daily before breakfast.     Multiple Vitamin (MULTIVITAMIN) LIQD Take 5 mLs by mouth daily.     pantoprazole (PROTONIX) 40 MG tablet Take 1 tablet (40 mg total) by mouth daily. 30 tablet 0   pregabalin (LYRICA) 25 MG capsule Take 25 mg by mouth 3 (three) times daily.     triamterene-hydrochlorothiazide (DYAZIDE) 37.5-25 MG capsule Take 1 capsule by mouth daily.     Current Facility-Administered Medications on File Prior to Visit  Medication Dose Route Frequency Provider Last Rate Last Admin   triamcinolone acetonide (KENALOG-40) injection 40 mg  40 mg Intra-articular Once Myra Rude, MD        Allergies:   Allergies  Allergen Reactions   Latex    Penicillins    Hydrocodone-Acetaminophen Anxiety    Physical Exam General: well developed, well nourished, seated, in no evident distress Head: head normocephalic and atraumatic.   Neck: supple with no carotid or supraclavicular bruits Cardiovascular: regular rate and rhythm, no murmurs Musculoskeletal: no deformity Skin:  no rash/petichiae Vascular:  Normal pulses all extremities  Neurologic Exam Mental Status: Awake and fully alert. Oriented to place and time. Recent and remote memory intact. Attention span, concentration and fund of knowledge appropriate. Mood and affect appropriate.  Cranial Nerves: Fundoscopic exam reveals sharp disc margins. Pupils equal, briskly reactive to light. Extraocular movements full without nystagmus. Visual fields full to confrontation. Hearing intact. Facial sensation intact. Face, tongue, palate moves normally and symmetrically.  Motor: Normal bulk and tone. Normal strength in all tested extremity muscles. Sensory.: intact to touch , pinprick , position and vibratory sensation.  Coordination:  Rapid alternating movements normal in all extremities. Finger-to-nose and heel-to-shin performed accurately bilaterally. Gait and Station: Arises from chair without difficulty. Stance is normal. Gait demonstrates normal stride length and balance . Able to heel, toe and tandem walk without difficulty.  Reflexes: 1+ and symmetric. Toes downgoing.   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  Patient is done very well with minimal residual deficits.  Vascular risk factors of hypertension and hyperlipidemia.     PLAN: I had a long d/w patient about his recent stroke, risk for recurrent stroke/TIAs, personally independently reviewed imaging studies and stroke evaluation results and answered questions.Continue aspirin 81 mg daily  for secondary stroke prevention and maintain strict control of hypertension with blood pressure goal below 130/90, diabetes with hemoglobin A1c goal below 6.5% and lipids with LDL cholesterol goal below 70 mg/dL. I also advised the patient to eat a healthy diet with plenty  of whole grains, cereals, fruits and vegetables, exercise regularly and maintain ideal body weight .refer to cardiac electrophysiology for loop recorder insertion for paroxysmal A-fib.  Followup in the future with me in 3 months or call earlier if necessary.  Greater than 50% time during this 45-minute consultation visit was spent on counseling and coordination of care about her cryptogenic stroke and discussion about evaluation and treatment and answering questions. Delia Heady, MD  Note: This document was prepared with digital dictation and possible smart phrase technology. Any transcriptional errors that result from this process are unintentional.

## 2021-09-19 NOTE — Patient Instructions (Signed)
I had a long d/w patient about his recent stroke, risk for recurrent stroke/TIAs, personally independently reviewed imaging studies and stroke evaluation results and answered questions.Continue aspirin 81 mg daily  for secondary stroke prevention and maintain strict control of hypertension with blood pressure goal below 130/90, diabetes with hemoglobin A1c goal below 6.5% and lipids with LDL cholesterol goal below 70 mg/dL. I also advised the patient to eat a healthy diet with plenty of whole grains, cereals, fruits and vegetables, exercise regularly and maintain ideal body weight .refer to cardiac electrophysiology for loop recorder insertion for paroxysmal A-fib.  Followup in the future with me in 3 months or call earlier if necessary. Stroke Prevention Some medical conditions and behaviors can lead to a higher chance of having a stroke. You can help prevent a stroke by eating healthy, exercising, not smoking, and managing any medical conditions you have. Stroke is a leading cause of functional impairment. Primary prevention is particularly important because a majority of strokes are first-time events. Stroke changes the lives of not only those who experience a stroke but also their family and other caregivers. How can this condition affect me? A stroke is a medical emergency and should be treated right away. A stroke can lead to brain damage and can sometimes be life-threatening. If a person gets medical treatment right away, there is a better chance of surviving and recovering from a stroke. What can increase my risk? The following medical conditions may increase your risk of a stroke: Cardiovascular disease. High blood pressure (hypertension). Diabetes. High cholesterol. Sickle cell disease. Blood clotting disorders (hypercoagulable state). Obesity. Sleep disorders (obstructive sleep apnea). Other risk factors include: Being older than age 59. Having a history of blood clots, stroke, or  mini-stroke (transient ischemic attack, TIA). Genetic factors, such as race, ethnicity, or a family history of stroke. Smoking cigarettes or using other tobacco products. Taking birth control pills, especially if you also use tobacco. Heavy use of alcohol or drugs, especially cocaine and methamphetamine. Physical inactivity. What actions can I take to prevent this? Manage your health conditions High cholesterol levels. Eating a healthy diet is important for preventing high cholesterol. If cholesterol cannot be managed through diet alone, you may need to take medicines. Take any prescribed medicines to control your cholesterol as told by your health care provider. Hypertension. To reduce your risk of stroke, try to keep your blood pressure below 130/80. Eating a healthy diet and exercising regularly are important for controlling blood pressure. If these steps are not enough to manage your blood pressure, you may need to take medicines. Take any prescribed medicines to control hypertension as told by your health care provider. Ask your health care provider if you should monitor your blood pressure at home. Have your blood pressure checked every year, even if your blood pressure is normal. Blood pressure increases with age and some medical conditions. Diabetes. Eating a healthy diet and exercising regularly are important parts of managing your blood sugar (glucose). If your blood sugar cannot be managed through diet and exercise, you may need to take medicines. Take any prescribed medicines to control your diabetes as told by your health care provider. Get evaluated for obstructive sleep apnea. Talk to your health care provider about getting a sleep evaluation if you snore a lot or have excessive sleepiness. Make sure that any other medical conditions you have, such as atrial fibrillation or atherosclerosis, are managed. Nutrition Follow instructions from your health care provider about what to  eat or  drink to help manage your health condition. These instructions may include: Reducing your daily calorie intake. Limiting how much salt (sodium) you use to 1,500 milligrams (mg) each day. Using only healthy fats for cooking, such as olive oil, canola oil, or sunflower oil. Eating healthy foods. You can do this by: Choosing foods that are high in fiber, such as whole grains, and fresh fruits and vegetables. Eating at least 5 servings of fruits and vegetables a day. Try to fill one-half of your plate with fruits and vegetables at each meal. Choosing lean protein foods, such as lean cuts of meat, poultry without skin, fish, tofu, beans, and nuts. Eating low-fat dairy products. Avoiding foods that are high in sodium. This can help lower blood pressure. Avoiding foods that have saturated fat, trans fat, and cholesterol. This can help prevent high cholesterol. Avoiding processed and prepared foods. Counting your daily carbohydrate intake.  Lifestyle If you drink alcohol: Limit how much you have to: 0-1 drink a day for women who are not pregnant. 0-2 drinks a day for men. Know how much alcohol is in your drink. In the [place] one drink equals one 12 oz bottle of beer (310m), one 5 oz glass of wine (1436m, or one 1 oz glass of hard liquor (4479m Do not use any products that contain nicotine or tobacco. These products include cigarettes, chewing tobacco, and vaping devices, such as e-cigarettes. If you need help quitting, ask your health care provider. Avoid secondhand smoke. Do not use drugs. Activity  Try to stay at a healthy weight. Get at least 30 minutes of exercise on most days, such as: Fast walking. Biking. Swimming. Medicines Take over-the-counter and prescription medicines only as told by your health care provider. Aspirin or blood thinners (antiplatelets or anticoagulants) may be recommended to reduce your risk of forming blood clots that can lead to stroke. Avoid taking  birth control pills. Talk to your health care provider about the risks of taking birth control pills if: You are over 35 29ars old. You smoke. You get very bad headaches. You have had a blood clot. Where to find more information American Stroke Association: www.strokeassociation.org Get help right away if: You or a loved one has any symptoms of a stroke. "BE FAST" is an easy way to remember the main warning signs of a stroke: B - Balance. Signs are dizziness, sudden trouble walking, or loss of balance. E - Eyes. Signs are trouble seeing or a sudden change in vision. F - Face. Signs are sudden weakness or numbness of the face, or the face or eyelid drooping on one side. A - Arms. Signs are weakness or numbness in an arm. This happens suddenly and usually on one side of the body. S - Speech. Signs are sudden trouble speaking, slurred speech, or trouble understanding what people say. T - Time. Time to call emergency services. Write down what time symptoms started. You or a loved one has other signs of a stroke, such as: A sudden, severe headache with no known cause. Nausea or vomiting. Seizure. These symptoms may represent a serious problem that is an emergency. Do not wait to see if the symptoms will go away. Get medical help right away. Call your local emergency services (911 in the [place] Do not drive yourself to the hospital. Summary You can help to prevent a stroke by eating healthy, exercising, not smoking, limiting alcohol intake, and managing any medical conditions you may have. Do not use any products that contain nicotine  or tobacco. These include cigarettes, chewing tobacco, and vaping devices, such as e-cigarettes. If you need help quitting, ask your health care provider. Remember "BE FAST" for warning signs of a stroke. Get help right away if you or a loved one has any of these signs. This information is not intended to replace advice given to you by your health care provider. Make  sure you discuss any questions you have with your health care provider. Document Revised: 09/12/2019 Document Reviewed: 09/12/2019 Elsevier Patient Education  [place].

## 2021-09-24 ENCOUNTER — Ambulatory Visit: Payer: Medicare Other | Admitting: Cardiology

## 2021-10-10 ENCOUNTER — Ambulatory Visit: Payer: Medicare Other | Admitting: Cardiology

## 2021-10-10 ENCOUNTER — Encounter: Payer: Self-pay | Admitting: Cardiology

## 2021-11-05 DIAGNOSIS — M898X9 Other specified disorders of bone, unspecified site: Secondary | ICD-10-CM | POA: Insufficient documentation

## 2021-11-11 ENCOUNTER — Encounter: Payer: Self-pay | Admitting: Family Medicine

## 2021-11-11 ENCOUNTER — Ambulatory Visit: Payer: Self-pay

## 2021-11-11 ENCOUNTER — Ambulatory Visit: Payer: Medicare Other | Admitting: Family Medicine

## 2021-11-11 VITALS — BP 112/77 | Ht 61.0 in | Wt 174.0 lb

## 2021-11-11 DIAGNOSIS — M1712 Unilateral primary osteoarthritis, left knee: Secondary | ICD-10-CM

## 2021-11-11 MED ORDER — TRIAMCINOLONE ACETONIDE 32 MG IX SRER
32.0000 mg | Freq: Once | INTRA_ARTICULAR | Status: AC
  Administered 2021-11-11: 32 mg via INTRA_ARTICULAR

## 2021-11-11 NOTE — Assessment & Plan Note (Signed)
Acutely worsening.  She had done well with the previous injection. -Counseled on home exercise therapy and supportive care. -Zilretta injection today. -Could consider physical therapy.

## 2021-11-11 NOTE — Patient Instructions (Signed)
Good to see you Please use ice   Please send me a message in MyChart with any questions or updates.  Please see me back in 3 months .   --Dr. Raley Novicki  

## 2021-11-11 NOTE — Progress Notes (Signed)
  Tiffany Byrd - 81 y.o. female MRN [id number]  Date of birth: [birth year]  SUBJECTIVE:  Including CC & ROS.  No chief complaint on file.   Tiffany Byrd is a 81 y.o. female that is presenting with acute worsening of her left knee pain.  The pain is similar to her prior pain.  It is occurring over the medial aspect.    Review of Systems See HPI   HISTORY: Past Medical, Surgical, Social, and Family History Reviewed & Updated per EMR.   Pertinent Historical Findings include:  Past Medical History:  Diagnosis Date   GERD (gastroesophageal reflux disease)    Hyperlipidemia    Thyroid disease     Past Surgical History:  Procedure Laterality Date   ABDOMINAL HYSTERECTOMY     CHOLECYSTECTOMY     LUMBAR LAMINECTOMY/DECOMPRESSION MICRODISCECTOMY     REPLACEMENT TOTAL KNEE       PHYSICAL EXAM:  VS: BP 112/77 (BP Location: Left Arm, Patient Position: Sitting)   Ht 5\' 1"  (1.549 m)   Wt 174 lb (78.9 kg)   BMI 32.88 kg/m  Physical Exam Gen: NAD, alert, cooperative with exam, well-appearing MSK:  Neurovascularly intact     Aspiration/Injection Procedure Note Tiffany Byrd 05-14-1940  Procedure: Injection Indications: left knee pain  Procedure Details Consent: Risks of procedure as well as the alternatives and risks of each were explained to the (patient/caregiver).  Consent for procedure obtained. Time Out: Verified patient identification, verified procedure, site/side was marked, verified correct patient position, special equipment/implants available, medications/allergies/relevent history reviewed, required imaging and test results available.  Performed.  The area was cleaned with iodine and alcohol swabs.    The left knee superior lateral suprapatellar pouch knee superior lateral suprapatellar pouch was injected using 3 cc of 1% lidocaine on a 25-gauge 1-1/2 inch needle.  The syringe was switched and a mixture containing 5 cc's of 32 mg Zilretta and 4 cc's of 0.25% bupivacaine  was injected.  Ultrasound was used. Images were obtained in long views showing the injection.    A sterile dressing was applied.  Patient did tolerate procedure well.      ASSESSMENT & PLAN:   Primary osteoarthritis of left knee Acutely worsening.  She had done well with the previous injection. -Counseled on home exercise therapy and supportive care. -Zilretta injection today. -Could consider physical therapy.

## 2021-12-23 ENCOUNTER — Encounter: Payer: Self-pay | Admitting: Neurology

## 2021-12-23 ENCOUNTER — Ambulatory Visit: Payer: Medicare Other | Admitting: Neurology

## 2021-12-23 VITALS — BP 130/74 | HR 61 | Ht 61.0 in | Wt 167.0 lb

## 2021-12-23 DIAGNOSIS — R200 Anesthesia of skin: Secondary | ICD-10-CM | POA: Diagnosis not present

## 2021-12-23 DIAGNOSIS — R202 Paresthesia of skin: Secondary | ICD-10-CM | POA: Diagnosis not present

## 2021-12-23 DIAGNOSIS — I639 Cerebral infarction, unspecified: Secondary | ICD-10-CM | POA: Diagnosis not present

## 2021-12-23 MED ORDER — PREGABALIN 75 MG PO CAPS: 75.0000 mg | ORAL_CAPSULE | Freq: Two times a day (BID) | ORAL | 1 refills | Status: AC

## 2021-12-23 NOTE — Patient Instructions (Signed)
I had a long d/w patient about her cryptogenic stroke, risk for recurrent stroke/TIAs, personally independently reviewed imaging studies and stroke evaluation results and answered questions.Continue aspirin 81 mg daily  for secondary stroke prevention and maintain strict control of hypertension with blood pressure goal below 130/90, diabetes with hemoglobin A1c goal below 6.5% and lipids with LDL cholesterol goal below 70 mg/dL. I also advised the patient to eat a healthy diet with plenty of whole grains, cereals, fruits and vegetables, exercise regularly and maintain ideal body weight .continue Lyrica 75 mg in the morning and 150 mg at night for her poststroke and paresthesias.  Refer to cardiac electrophysiology for loop recorder insertion for paroxysmal AFib detection. followup in the future with Tiffany Byrd, nurse practitioner in 6 months or call earlier if necessary.

## 2021-12-23 NOTE — Progress Notes (Signed)
[facility] [address] [place]. [place] [address] [phone]       OFFICE FOLLOW-UP VISIT NOTE  Tiffany Byrd Date of Birth:  Jul 11, 1940 Medical Record Number:  [id number]   Referring MD: Berle Mull  Reason for Referral: Stroke  HPI: Initial visit 09/19/2021 [name] is a 81 year old lady seen today for initial office consultation visit for stroke.  History is obtained from the patient and review of electronic medical records and I have also personally reviewed pertinent available imaging films in PACS.  She has past medical history of thyroid disease, hypertension, hyperlipidemia and gastroesophageal reflux disease.  She presented to [facility] emergency room with sudden onset of left hand weakness, heaviness and clumsiness restarted at 11:30 AM on 07/13/2021.  She woke up that morning feeling fine and symptoms appear later on.  The ED provider note noticed some ataxia on the left and hence stroke work-up was done.  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  MRI scan showed a small right frontal motor strip embolic infarct.  LDL cholesterol is 93 mg percent hemoglobin A1c of 6.2.  She was started on aspirin and Plavix for 3 weeks followed by aspirin alone.  She was also started on Lipitor 80 mg daily which is tolerating well without side effects and follow-up lipid profile on 09/12/2021 showed improvement with LDL cholesterol down to 57 mg percent.  She also wore a 30-day heart monitor from 07/26/2021 to 08/28/2021 which was negative for atrial fibrillation or cardiac arrhythmias.  Patient states left-sided weakness has improved she still has some minor paresthesias at the tip of the fingers but she has good strength.  She is finished outpatient physical and Occupational Therapy.  She is tolerating aspirin well with only minor bruising and no  bleeding.  She is still doing some home exercises regularly.  Her primary care physician increase the dose of Lyrica to 75 mg which seems to have helped with the left-sided paresthesias as well.  She has no complaints today.  She has no prior history of stroke TIAs, migraines, seizures or any neurological problems.  She has arthritis in her knee with severe pain and walks with a walker. Updated 12/23/2021 : She returns for follow-up after last visit 3 months ago.  She is accompanied by her son.  Continues to have paresthesias in the right hand, stroke the primary care physician has recently increased her Lyrica to 75 mg in morning and 150 at night which seems to be helping ambulate with a walker due to arthritis in her left knee and right hip.  She has not had any recurrent stroke or TIA symptoms.  She remains on aspirin which she is tolerating well without any bruising or bleeding.  Her blood pressure is well controlled and today it is 130/74.  I recommended a loop recorder at last visit but for unclear reasons that has not yet happened.  She is tolerating Lipitor well without muscle aches and pains.  Last lipid profile 11/01/2021 showed LDL cholesterol to be optimal at 35 mg%.  She has no new complaints.  ROS:   14 system review of systems is positive for numbness, weakness, bruising, knee pain, difficulty walking all other systems negative  PMH:  Past Medical History:  Diagnosis Date   GERD (gastroesophageal reflux disease)    Hyperlipidemia    Thyroid disease  Social History:  Social History   Socioeconomic History   Marital status: Widowed    Spouse name: Not on file   Number of children: Not on file   Years of education: Not on file   Highest education level: Not on file  Occupational History   Not on file  Tobacco Use   Smoking status: Never   Smokeless tobacco: Never  Substance and Sexual Activity   Alcohol use: Not on file   Drug use: Not on file   Sexual activity: Not on  file  Other Topics Concern   Not on file  Social History Narrative   Not on file   Social Determinants of Health   Financial Resource Strain: Not on file  Food Insecurity: Not on file  Transportation Needs: Not on file  Physical Activity: Not on file  Stress: Not on file  Social Connections: Not on file  Intimate Partner Violence: Not on file    Medications:   Current Outpatient Medications on File Prior to Visit  Medication Sig Dispense Refill   acetaminophen (TYLENOL) 500 MG tablet Take 500 mg by mouth every 6 (six) hours as needed for moderate pain.     aspirin EC 81 MG tablet Take 1 tablet (81 mg total) by mouth daily. Swallow whole. 120 tablet 0   atenolol (TENORMIN) 25 MG tablet Take 25 mg by mouth at bedtime.     atorvastatin (LIPITOR) 80 MG tablet Take 1 tablet (80 mg total) by mouth daily. 30 tablet 0   cholecalciferol (VITAMIN D) 400 units TABS tablet Take 400 Units by mouth.     Cyclobenzaprine HCl (FLEXERIL PO) Take 1 tablet by mouth daily as needed (muscle spasms).     levothyroxine (SYNTHROID) 75 MCG tablet Take 75 mcg by mouth daily before breakfast.     Multiple Vitamin (MULTIVITAMIN) LIQD Take 5 mLs by mouth daily.     pantoprazole (PROTONIX) 40 MG tablet Take 1 tablet (40 mg total) by mouth daily. 30 tablet 0   triamterene-hydrochlorothiazide (DYAZIDE) 37.5-25 MG capsule Take 1 capsule by mouth daily.     Current Facility-Administered Medications on File Prior to Visit  Medication Dose Route Frequency Provider Last Rate Last Admin   triamcinolone acetonide (KENALOG-40) injection 40 mg  40 mg Intra-articular Once Myra Rude, MD        Allergies:   Allergies  Allergen Reactions   Latex    Penicillins    Hydrocodone-Acetaminophen Anxiety    Physical Exam General: well developed, well nourished pleasant elderly African-American lady, seated, in no evident distress Head: head normocephalic and atraumatic.   Neck: supple with no carotid or  supraclavicular bruits Cardiovascular: regular rate and rhythm, no murmurs Musculoskeletal: no deformity Skin:  no rash/petichiae Vascular:  Normal pulses all extremities  Neurologic Exam Mental Status: Awake and fully alert. Oriented to place and time. Recent and remote memory intact. Attention span, concentration and fund of knowledge appropriate. Mood and affect appropriate.  Cranial Nerves: Fundoscopic exam not done. Pupils equal, briskly reactive to light. Extraocular movements full without nystagmus. Visual fields full to confrontation. Hearing intact. Facial sensation intact. Face, tongue, palate moves normally and symmetrically.  Motor: Normal bulk and tone. Normal strength in all tested extremity muscles. Finger movements on the right.  Orbits left or right upper extremity. Sensory.: intact to touch , pinprick , position and vibratory sensation.  Coordination: Rapid alternating movements normal in all extremities. Finger-to-nose and heel-to-shin performed accurately bilaterally. Gait and Station: Arises from chair  without difficulty. Stance is normal.  Gait is cautious and uses a wheeled walker due to arthritis pain Reflexes: 1+ and symmetric. Toes downgoing.      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  Patient is done very well with minimal residual deficits.  Vascular risk factors of hypertension and hyperlipidemia.     PLAN: I had a long d/w patient about her cryptogenic stroke, risk for recurrent stroke/TIAs, personally independently reviewed imaging studies and stroke evaluation results and answered questions.Continue aspirin 81 mg daily  for secondary stroke prevention and maintain strict control of hypertension with blood pressure goal below 130/90, diabetes with hemoglobin A1c goal below 6.5% and lipids with LDL cholesterol goal below 70 mg/dL. I also advised the patient to eat a healthy diet with plenty of whole  grains, cereals, fruits and vegetables, exercise regularly and maintain ideal body weight .continue Lyrica 75 mg in the morning and 150 mg at night for her poststroke and paresthesias.  Refer to cardiac electrophysiology for loop recorder insertion for paroxysmal AFib detection. followup in the future with Janett Billow, nurse practitioner in 6 months or call earlier if necessary. Greater than 50% time during this 35-minute visit was spent on counseling and coordination of care about her cryptogenic stroke and discussion about evaluation and treatment and answering questions. Antony Contras, MD  Note: This document was prepared with digital dictation and possible smart phrase technology. Any transcriptional errors that result from this process are unintentional.

## 2021-12-24 ENCOUNTER — Telehealth: Payer: Self-pay | Admitting: Neurology

## 2021-12-24 NOTE — Telephone Encounter (Signed)
Rx for Lyrica needs to be updated original rx has two sets of directions and dispense quantity of 1.  I have pended order with signature take 75 mg in the morning and 150 mg at night with quantity 90 and 3 refills.

## 2021-12-24 NOTE — Telephone Encounter (Signed)
Tiffany Byrd from the [employer] called needing clarification on the pt's Rx for pregabalin (LYRICA) 75 MG capsule Please advise.

## 2022-01-30 ENCOUNTER — Telehealth: Payer: Self-pay

## 2022-01-30 NOTE — Telephone Encounter (Signed)
Ran patients benefits via [employer] for [employer]

## 2022-01-31 NOTE — Telephone Encounter (Signed)
Received patients benefits summary from [employer]. No prior auth or referrals needed. Patients responsibility for J3304 will be 20% with the remaining covered at 80% by payer. Patient is responsible for $20 copay for CPT code 03546 with remaining covered at 100% by payer.   Patient scheduled, and aware of above.

## 2022-02-03 ENCOUNTER — Ambulatory Visit: Payer: Medicare Other | Attending: Cardiology | Admitting: Cardiology

## 2022-02-03 ENCOUNTER — Encounter: Payer: Self-pay | Admitting: Cardiology

## 2022-02-03 VITALS — BP 118/76 | HR 59 | Ht 61.0 in | Wt 172.6 lb

## 2022-02-03 DIAGNOSIS — I639 Cerebral infarction, unspecified: Secondary | ICD-10-CM

## 2022-02-03 DIAGNOSIS — R269 Unspecified abnormalities of gait and mobility: Secondary | ICD-10-CM | POA: Insufficient documentation

## 2022-02-03 NOTE — Patient Instructions (Signed)
Medication Instructions:  Your physician recommends that you continue on your current medications as directed. Please refer to the Current Medication list given to you today.  Labwork: None ordered.  Testing/Procedures: None ordered.  Follow-Up:  Your physician wants you to follow-up as needed with Dr. Camnitz..  You will receive a reminder letter in the mail two months in advance. If you don't receive a letter, please call our office to schedule the follow-up appointment.    Implantable Loop Recorder Placement, Care After This sheet gives you information about how to care for yourself after your procedure. Your health care provider may also give you more specific instructions. If you have problems or questions, contact your health care provider. What can I expect after the procedure? After the procedure, it is common to have: Soreness or discomfort near the incision. Some swelling or bruising near the incision.  Follow these instructions at home: Incision care  Monitor your cardiac device site for redness, swelling, and drainage. Call the device clinic at [phone] if you experience these symptoms or fever/chills.  Keep the large square bandage on your site for 24 hours and then you may remove it yourself. Keep the steri-strips underneath in place.   You may shower after 72 hours / 3 days from your procedure with the steri-strips in place. They will usually fall off on their own, or may be removed after 10 days. Pat dry.   Avoid lotions, ointments, or perfumes over your incision until it is well-healed.  Please do not submerge in water until your site is completely healed.   Your device is MRI compatible.   Remote monitoring is used to monitor your cardiac device from home. This monitoring is scheduled every month by our office. It allows us to keep an eye on the function of your device to ensure it is working properly.  If your wound site starts to bleed apply pressure.     For help with the monitor please call [employer] Monitor Support Specialist directly at [phone].    If you have any questions/concerns please call the device clinic at [phone].  Activity  Return to your normal activities.  General instructions Follow instructions from your health care provider about how to manage your implantable loop recorder and transmit the information. Learn how to activate a recording if this is necessary for your type of device. You may go through a metal detection gate, and you may let someone hold a metal detector over your chest. Show your ID card if needed. Do not have an MRI unless you check with your health care provider first. Take over-the-counter and prescription medicines only as told by your health care provider. Keep all follow-up visits as told by your health care provider. This is important. Contact a health care provider if: You have redness, swelling, or pain around your incision. You have a fever. You have pain that is not relieved by your pain medicine. You have triggered your device because of fainting (syncope) or because of a heartbeat that feels like it is racing, slow, fluttering, or skipping (palpitations). Get help right away if you have: Chest pain. Difficulty breathing. Summary After the procedure, it is common to have soreness or discomfort near the incision. Change your dressing as told by your health care provider. Follow instructions from your health care provider about how to manage your implantable loop recorder and transmit the information. Keep all follow-up visits as told by your health care provider. This is important. This information is   not intended to replace advice given to you by your health care provider. Make sure you discuss any questions you have with your health care provider. Document Released: 01/22/2015 Document Revised: 03/28/2017 Document Reviewed: 03/28/2017 Elsevier Patient Education  2020 Elsevier  Inc.  

## 2022-02-03 NOTE — Progress Notes (Signed)
Electrophysiology Office Note   Date:  02/03/2022   ID:  Tiffany Byrd, DOB 01-13-1941, MRN [id number]  PCP:  Raynelle Jan., MD  Cardiologist:   Primary Electrophysiologist:  Lubertha Leite Jorja Loa, MD    Chief Complaint: CVA   History of Present Illness: Tiffany Byrd is a 81 y.o. female who is being seen today for the evaluation of CVA at the request of Micki Riley, MD. Presenting today for electrophysiology evaluation.  She has a history significant for thyroid disease, hyperlipidemia.  She presented to the hospital May 2023 with a right MCA territory CVA.  She presented to the hospital with left arm weakness.  Today, she denies symptoms of palpitations, chest pain, shortness of breath, orthopnea, PND, lower extremity edema, claudication, dizziness, presyncope, syncope, bleeding, or neurologic sequela. The patient is tolerating medications without difficulties.  She continues to have weakness of her left arm.  She is able to use it doing most activities.  She also has some numbness at the tips of her fingers.  Aside from that, she feels like she has recovered from her CVA.   Past Medical History:  Diagnosis Date   GERD (gastroesophageal reflux disease)    Hyperlipidemia    Stroke (HCC)    Thyroid disease    Past Surgical History:  Procedure Laterality Date   ABDOMINAL HYSTERECTOMY     CHOLECYSTECTOMY     LUMBAR LAMINECTOMY/DECOMPRESSION MICRODISCECTOMY     REPLACEMENT TOTAL KNEE       Current Outpatient Medications  Medication Sig Dispense Refill   acetaminophen (TYLENOL) 500 MG tablet Take 500 mg by mouth every 6 (six) hours as needed for moderate pain.     aspirin EC 81 MG tablet Take 1 tablet (81 mg total) by mouth daily. Swallow whole. 120 tablet 0   atenolol (TENORMIN) 25 MG tablet Take 25 mg by mouth at bedtime.     atorvastatin (LIPITOR) 80 MG tablet Take 1 tablet (80 mg total) by mouth daily. 30 tablet 0   cholecalciferol (VITAMIN D) 400 units TABS tablet  Take 400 Units by mouth.     Cyclobenzaprine HCl (FLEXERIL PO) Take 1 tablet by mouth daily as needed (muscle spasms).     levothyroxine (SYNTHROID) 75 MCG tablet Take 75 mcg by mouth daily before breakfast.     Multiple Vitamin (MULTIVITAMIN) LIQD Take 5 mLs by mouth daily.     pantoprazole (PROTONIX) 40 MG tablet Take 1 tablet (40 mg total) by mouth daily. 30 tablet 0   pregabalin (LYRICA) 75 MG capsule Take 1 capsule (75 mg total) by mouth 2 (two) times daily. Take 75 mg in morning and 150 mg at night1 1 capsule 1   triamterene-hydrochlorothiazide (DYAZIDE) 37.5-25 MG capsule Take 1 capsule by mouth daily.     Current Facility-Administered Medications  Medication Dose Route Frequency Provider Last Rate Last Admin   triamcinolone acetonide (KENALOG-40) injection 40 mg  40 mg Intra-articular Once Myra Rude, MD        Allergies:   Latex, Penicillins, and Hydrocodone-acetaminophen   Social History:  The patient  reports that she has never smoked. She has never used smokeless tobacco.   Family History:  The patient's family history is not on file.    ROS:  Please see the history of present illness.   Otherwise, review of systems is positive for none.   All other systems are reviewed and negative.    PHYSICAL EXAM: VS:  BP 118/76   Pulse (!) 59  Ht 5\' 1"  (1.549 m)   Wt 172 lb 9.6 oz (78.3 kg)   SpO2 98%   BMI 32.61 kg/m  , BMI Body mass index is 32.61 kg/m. GEN: Well nourished, well developed, in no acute distress  HEENT: normal  Neck: no JVD, carotid bruits, or masses Cardiac: RRR; no murmurs, rubs, or gallops,no edema  Respiratory:  clear to auscultation bilaterally, normal work of breathing GI: soft, nontender, nondistended, + BS MS: no deformity or atrophy  Skin: warm and dry Neuro:  Strength and sensation are intact Psych: euthymic mood, full affect  EKG:  EKG is ordered today. Personal review of the ekg ordered shows sinus rhythm  Recent Labs: 07/14/2021: ALT  17; BUN 16; Creatinine, Ser 1.00; Hemoglobin 12.8; Magnesium 1.9; Platelets 425; Potassium 4.2; Sodium 137    Lipid Panel     Component Value Date/Time   CHOL 172 07/14/2021 0910   TRIG 48 07/14/2021 0910   HDL 69 07/14/2021 0910   CHOLHDL 2.5 07/14/2021 0910   VLDL 10 07/14/2021 0910   LDLCALC 93 07/14/2021 0910     Wt Readings from Last 3 Encounters:  02/03/22 172 lb 9.6 oz (78.3 kg)  12/23/21 167 lb (75.8 kg)  11/11/21 174 lb (78.9 kg)      Other studies Reviewed: Additional studies/ records that were reviewed today include: TTE 07/14/21  Review of the above records today demonstrates:   1. Basal inferior hypokinesis. . Left ventricular ejection fraction, by  estimation, is 55 to 60%. The left ventricle has normal function.   2. Right ventricular systolic function is low normal. The right  ventricular size is normal. There is normal pulmonary artery systolic  pressure.   3. The mitral valve is normal in structure. Mild mitral valve  regurgitation.   4. Tricuspid valve regurgitation is mild to moderate.   5. The aortic valve is normal in structure. Aortic valve regurgitation is  not visualized.   Cardiac monitor 08/29/2021 personally reviewed No atrial fibrillation present. Sinus rhythm overall average heart rate 69 bpm. No pauses.  ASSESSMENT AND PLAN:  1.  Cryptogenic stroke: No causes been found.  Wore a cardiac monitor without evidence of atrial fibrillation.  Ejection fraction is normal.  She would benefit from ILR implant for atrial fibrillation monitoring.  Risk and benefits have been discussed.  Risk include bleeding and infection.  She understands these risks and is agreed to the procedure.    Current medicines are reviewed at length with the patient today.   The patient does not have concerns regarding her medicines.  The following changes were made today:  none  Labs/ tests ordered today include:  Orders Placed This Encounter  Procedures   EKG 12-Lead      Disposition:   FU pending ILR results  Signed, Madisyn Mawhinney 10/30/2021, MD  02/03/2022 11:43 AM     [facility] [address] [place] [phone] (office) [phone] (fax)  SURGEON:  [phone], MD     PREPROCEDURE DIAGNOSIS:  Cryptogenic Stroke    POSTPROCEDURE DIAGNOSIS:  Cryptogenic Stroke     PROCEDURES:   1. Implantable loop recorder implantation    INTRODUCTION:  Tiffany Byrd is a 81 y.o. female with a history of unexplained stroke who presents today for implantable loop implantation.  The patient has had a cryptogenic stroke.  Despite an extensive workup by neurology, no reversible causes have been identified.  she has worn telemetry during which she did not have arrhythmias.  There is significant concern for possible atrial fibrillation as the cause for the patients stroke.  The patient therefore presents today for implantable loop implantation.     DESCRIPTION OF PROCEDURE:  Informed written consent was obtained, and the patient was brought to the electrophysiology lab in a fasting state.  The patient required no sedation for the procedure today.  Mapping over the patient's chest was performed by the EP lab staff to identify the area where electrograms were most prominent for ILR recording.  This area was found to be the left parasternal region over the 3rd-4th intercostal space. The patients left chest was therefore prepped and draped in the usual sterile fashion by the EP lab staff. The skin overlying the left parasternal region was infiltrated with lidocaine for local analgesia.  A 0.5-cm incision was made over the left parasternal region over the 3rd intercostal space.  A subcutaneous ILR pocket was fashioned using a combination of sharp and blunt dissection.  A Medtronic Reveal Linq model [place] [id number] implantable loop recorder was then placed into the pocket  R waves were very prominent and measured 0.12mV. EBL<1 ml.  Steri-  Strips and a sterile dressing were then applied.  There were no early apparent complications.     CONCLUSIONS:   1. Successful implantation of a Medtronic Reveal LINQ implantable loop recorder for cryptogenic stroke  2. No early apparent complications.

## 2022-02-10 ENCOUNTER — Encounter: Payer: Self-pay | Admitting: Family Medicine

## 2022-02-10 ENCOUNTER — Ambulatory Visit: Payer: Self-pay

## 2022-02-10 ENCOUNTER — Ambulatory Visit: Payer: Medicare Other | Admitting: Family Medicine

## 2022-02-10 VITALS — BP 130/70 | Ht 61.0 in | Wt 172.0 lb

## 2022-02-10 DIAGNOSIS — M1712 Unilateral primary osteoarthritis, left knee: Secondary | ICD-10-CM

## 2022-02-10 MED ORDER — TRIAMCINOLONE ACETONIDE 32 MG IX SRER
32.0000 mg | Freq: Once | INTRA_ARTICULAR | Status: AC
  Administered 2022-02-10: 32 mg via INTRA_ARTICULAR

## 2022-02-10 NOTE — Patient Instructions (Signed)
Good to see you Please use ice   Please send me a message in MyChart with any questions or updates.  Please see me back in 3 months .   --Dr. Trinity Haun  

## 2022-02-10 NOTE — Progress Notes (Signed)
  Tiffany Byrd - 81 y.o. female MRN [id number]  Date of birth: 10-18-1940  SUBJECTIVE:  Including CC & ROS.  No chief complaint on file.   Tiffany Byrd is a 81 y.o. female that is  here for zilretta injection.    Review of Systems See HPI   HISTORY: Past Medical, Surgical, Social, and Family History Reviewed & Updated per EMR.   Pertinent Historical Findings include:  Past Medical History:  Diagnosis Date   GERD (gastroesophageal reflux disease)    Hyperlipidemia    Stroke (HCC)    Thyroid disease     Past Surgical History:  Procedure Laterality Date   ABDOMINAL HYSTERECTOMY     CHOLECYSTECTOMY     LUMBAR LAMINECTOMY/DECOMPRESSION MICRODISCECTOMY     REPLACEMENT TOTAL KNEE       PHYSICAL EXAM:  VS: BP (!) 142/78   Ht 5\' 1"  (1.549 m)   Wt 172 lb (78 kg)   BMI 32.50 kg/m  Physical Exam Gen: NAD, alert, cooperative with exam, well-appearing MSK:  Neurovascularly intact     Aspiration/Injection Procedure Note Tiffany Byrd 11-Oct-1940  Procedure: Aspiration and Injection Indications: left knee pain  Procedure Details Consent: Risks of procedure as well as the alternatives and risks of each were explained to the (patient/caregiver).  Consent for procedure obtained. Time Out: Verified patient identification, verified procedure, site/side was marked, verified correct patient position, special equipment/implants available, medications/allergies/relevent history reviewed, required imaging and test results available.  Performed.  The area was cleaned with iodine and alcohol swabs.    The left knee superior lateral suprapatellar pouch was injected using 3 cc of 1% lidocaine on a 25-gauge 1-1/2 inch needle.  An 18-gauge 1-1/2 needle was used to achieve aspiration.  The syringe was switched and a mixture containing 5 cc's of 32 mg Zilretta and 4 cc's of 0.25% bupivacaine was injected.  Ultrasound was used. Images were obtained in long views showing the injection.   Amount of  Fluid Aspirated:  88mL Character of Fluid: clear and straw colored Fluid was sent for: n/a  A sterile dressing was applied.  Patient did tolerate procedure well.       ASSESSMENT & PLAN:   Primary osteoarthritis of left knee Completed zilretta injection.

## 2022-02-10 NOTE — Assessment & Plan Note (Signed)
Completed zilretta injection.  ?

## 2022-02-10 NOTE — Addendum Note (Signed)
Addended by: Cephas Darby on: 02/10/2022 09:46 AM   Modules accepted: Orders

## 2022-03-10 ENCOUNTER — Ambulatory Visit (INDEPENDENT_AMBULATORY_CARE_PROVIDER_SITE_OTHER): Payer: Medicare Other

## 2022-03-10 DIAGNOSIS — I639 Cerebral infarction, unspecified: Secondary | ICD-10-CM

## 2022-03-11 LAB — CUP PACEART REMOTE DEVICE CHECK
Date Time Interrogation Session: 20240114074849
Implantable Pulse Generator Implant Date: 20231211

## 2022-04-03 ENCOUNTER — Encounter (HOSPITAL_BASED_OUTPATIENT_CLINIC_OR_DEPARTMENT_OTHER): Payer: Self-pay

## 2022-04-03 ENCOUNTER — Emergency Department (HOSPITAL_BASED_OUTPATIENT_CLINIC_OR_DEPARTMENT_OTHER)
Admission: EM | Admit: 2022-04-03 | Discharge: 2022-04-03 | Disposition: A | Discharge status: Home / Self Care | Payer: Medicare Other | Attending: Emergency Medicine | Admitting: Emergency Medicine

## 2022-04-03 ENCOUNTER — Emergency Department (HOSPITAL_BASED_OUTPATIENT_CLINIC_OR_DEPARTMENT_OTHER): Payer: Medicare Other

## 2022-04-03 DIAGNOSIS — Z79899 Other long term (current) drug therapy: Secondary | ICD-10-CM | POA: Diagnosis not present

## 2022-04-03 DIAGNOSIS — M79605 Pain in left leg: Secondary | ICD-10-CM | POA: Diagnosis not present

## 2022-04-03 DIAGNOSIS — Z91040 Latex allergy status: Secondary | ICD-10-CM | POA: Diagnosis not present

## 2022-04-03 DIAGNOSIS — Z7982 Long term (current) use of aspirin: Secondary | ICD-10-CM | POA: Diagnosis not present

## 2022-04-03 MED ORDER — DICLOFENAC SODIUM 1 % EX GEL: 4.0000 g | Freq: Four times a day (QID) | CUTANEOUS | 0 refills | Status: AC

## 2022-04-03 NOTE — ED Triage Notes (Signed)
C/o left leg pain radiating down from left lumbar back x 1 week, pain worse with lying down & movement. States hx of stroke last year, residual left hand weakness. Denies any new weakness.

## 2022-04-03 NOTE — ED Provider Notes (Signed)
[place] [facility] [place] Provider Note   CSN: [id number] Arrival date & time: 04/03/22  1137     History  Chief Complaint  Patient presents with   Leg Pain    Tiffany Byrd is a 82 y.o. female.  82 yo F with a chief complaint of left leg pain.  Is from the knee down.  Seems to come and go.  Was worse last night and decided come in to be seen.  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  She is wondering of this discomfort could be due to her stroke or not.  She denies trauma to the area denies fevers.   Leg Pain      Home Medications Prior to Admission medications   Medication Sig Start Date End Date Taking? Authorizing Provider  diclofenac Sodium (VOLTAREN) 1 % GEL Apply 4 g topically 4 (four) times daily. 04/03/22  Yes Deno Etienne, DO  acetaminophen (TYLENOL) 500 MG tablet Take 500 mg by mouth every 6 (six) hours as needed for moderate pain.    [provider]  aspirin EC 81 MG tablet Take 1 tablet (81 mg total) by mouth daily. Swallow whole. 07/16/21   Lavina Hamman, MD  atenolol (TENORMIN) 25 MG tablet Take 25 mg by mouth at bedtime. 04/30/21   [provider]  atorvastatin (LIPITOR) 80 MG tablet Take 1 tablet (80 mg total) by mouth daily. 07/16/21   Lavina Hamman, MD  cholecalciferol (VITAMIN D) 400 units TABS tablet Take 400 Units by mouth.    [provider]  Cyclobenzaprine HCl (FLEXERIL PO) Take 1 tablet by mouth daily as needed (muscle spasms).    [provider]  levothyroxine (SYNTHROID) 75 MCG tablet Take 75 mcg by mouth daily before breakfast.    [provider]  Multiple Vitamin (MULTIVITAMIN) LIQD Take 5 mLs by mouth daily.    [provider]  pantoprazole (PROTONIX) 40 MG tablet Take 1 tablet (40 mg total) by mouth daily. 07/16/21   Lavina Hamman, MD  pregabalin (LYRICA) 75 MG  capsule Take 1 capsule (75 mg total) by mouth 2 (two) times daily. Take 75 mg in morning and 150 mg at night1 12/23/21   Garvin Fila, MD  triamterene-hydrochlorothiazide (DYAZIDE) 37.5-25 MG capsule Take 1 capsule by mouth daily.    [provider]      Allergies    Latex, Penicillins, and Hydrocodone-acetaminophen    Review of Systems   Review of Systems  Physical Exam Updated Vital Signs BP 122/67 (BP Location: Right Arm)   Pulse 67   Temp 97.9 F (36.6 C) (Oral)   Resp 18   Ht 5\' 1"  (1.549 m)   Wt 77.1 kg   SpO2 98%   BMI 32.12 kg/m  Physical Exam Vitals and nursing note reviewed.  Constitutional:      General: She is not in acute distress.    Appearance: She is well-developed. She is not diaphoretic.  HENT:     Head: Normocephalic and atraumatic.  Eyes:     Pupils: Pupils are equal, round, and reactive to light.  Cardiovascular:     Rate and Rhythm: Normal rate and regular rhythm.     Heart sounds: No murmur heard.    No friction rub. No gallop.  Pulmonary:     Effort: Pulmonary effort is normal.  Breath sounds: No wheezing or rales.  Abdominal:     General: There is no distension.     Palpations: Abdomen is soft.     Tenderness: There is no abdominal tenderness.  Musculoskeletal:        General: No tenderness.     Cervical back: Normal range of motion and neck supple.     Comments: Left lower extremity is mildly larger than the right.  Pulse motor and sensation intact distally.  Skin:    General: Skin is warm and dry.  Neurological:     Mental Status: She is alert and oriented to person, place, and time.  Psychiatric:        Behavior: Behavior normal.     ED Results / Procedures / Treatments   Labs (all labs ordered are listed, but only abnormal results are displayed) Labs Reviewed - No data to display  EKG None  Radiology US Venous Img Lower Unilateral Left  Result Date: 04/03/2022 CLINICAL DATA:  Left lower extremity pain and  swelling for 1 week. EXAM: Left LOWER EXTREMITY VENOUS DOPPLER ULTRASOUND TECHNIQUE: Gray-scale sonography with compression, as well as color and duplex ultrasound, were performed to evaluate the deep venous system(s) from the level of the common femoral vein through the popliteal and proximal calf veins. COMPARISON:  None Available. FINDINGS: VENOUS Normal compressibility of the common femoral, superficial femoral, and popliteal veins, as well as the visualized calf veins. Visualized portions of profunda femoral vein and great saphenous vein unremarkable. No filling defects to suggest DVT on grayscale or color Doppler imaging. Doppler waveforms show normal direction of venous flow, normal respiratory plasticity and response to augmentation. Limited views of the contralateral common femoral vein are unremarkable. OTHER Probable Baker's cyst seen in left popliteal fossa. Limitations: none IMPRESSION: No definite evidence of deep venous thrombosis seen in left lower extremity. Electronically Signed   By: Marijo Conception M.D.   On: 04/03/2022 13:16    Procedures Procedures    Medications Ordered in ED Medications - No data to display  ED Course/ Medical Decision Making/ A&P                             Medical Decision Making  82 yo F with a chief complaint of leg pain.  Sounds like a peripheral neuropathy, does not appear to have symptoms on the other side.  She thinks that this may be post stroke.  She has some similar pain in the left hand.  The leg is slightly larger on the left than the right.  Will obtain a DVT study.  DVT study is negative.  Will discharge home.  PCP follow-up.  1:20 PM:  I have discussed the diagnosis/risks/treatment options with the patient and family.  Evaluation and diagnostic testing in the emergency department does not suggest an emergent condition requiring admission or immediate intervention beyond what has been performed at this time.  They will follow up with PCP. We  also discussed returning to the ED immediately if new or worsening sx occur. We discussed the sx which are most concerning (e.g., sudden worsening pain, fever, inability to tolerate by mouth) that necessitate immediate return. Medications administered to the patient during their visit and any new prescriptions provided to the patient are listed below.  Medications given during this visit Medications - No data to display   The patient appears reasonably screen and/or stabilized for discharge and I doubt any other  medical condition or other [facility] requiring further screening, evaluation, or treatment in the ED at this time prior to discharge.          Final Clinical Impression(s) / ED Diagnoses Final diagnoses:  Left leg pain    Rx / DC Orders ED Discharge Orders          Ordered    diclofenac Sodium (VOLTAREN) 1 % GEL  4 times daily        04/03/22 1319              [place], DO 04/03/22 1320

## 2022-04-03 NOTE — Discharge Instructions (Addendum)
Your ultrasound was negative for a blood clot.  I prescribed you a gel that you can rub right on the area that hurts.  Please call your neurologist and ask them the question you asked me.  See when he went to see you in the office.

## 2022-04-13 LAB — CUP PACEART REMOTE DEVICE CHECK
Date Time Interrogation Session: 20240216074811
Implantable Pulse Generator Implant Date: 20231211

## 2022-04-14 ENCOUNTER — Ambulatory Visit (INDEPENDENT_AMBULATORY_CARE_PROVIDER_SITE_OTHER): Payer: Medicare Other

## 2022-04-14 DIAGNOSIS — I639 Cerebral infarction, unspecified: Secondary | ICD-10-CM

## 2022-04-22 NOTE — Progress Notes (Signed)
Carelink Summary Report / Loop Recorder 

## 2022-05-15 ENCOUNTER — Other Ambulatory Visit: Payer: Self-pay

## 2022-05-15 ENCOUNTER — Ambulatory Visit (INDEPENDENT_AMBULATORY_CARE_PROVIDER_SITE_OTHER): Payer: Medicare Other

## 2022-05-15 ENCOUNTER — Ambulatory Visit: Payer: Medicare Other | Admitting: Sports Medicine

## 2022-05-15 VITALS — Ht 61.0 in | Wt 173.0 lb

## 2022-05-15 DIAGNOSIS — M1712 Unilateral primary osteoarthritis, left knee: Secondary | ICD-10-CM

## 2022-05-15 DIAGNOSIS — M25462 Effusion, left knee: Secondary | ICD-10-CM

## 2022-05-15 NOTE — Patient Instructions (Addendum)
Good to see you Tylenol as needed for pain relief  As needed follow up

## 2022-05-15 NOTE — Progress Notes (Signed)
Tiffany Byrd D.[facility] Phone: [phone]   Assessment and Plan:     1. Primary osteoarthritis of left knee 2. Effusion of left knee joint  -Chronic with exacerbation, initial sports medicine visit - Patient presents with acute worsening of chronic knee pain after fall onto left knee occurring on 05/11/2022 - X-ray obtained in clinic.  My interpretation: No acute fracture or dislocation.  Moderate effusion, severe degenerative changes most significant in lateral compartment. - Patient elected for intra-articular knee aspiration and CSI.  Tolerated well per note below.  Most recent injection was performed on 02/10/2022 with Zilretta.  Okay to repeat today. - Recommend relative rest and using rollator versus 4-prong cane for support with ambulation  Procedure: Ultrasound Guided Knee Joint Injection/Aspiration Side: Left Diagnosis: Flare of osteoarthritis [place] Indication:  - accuracy is paramount for diagnosis - to ensure therapeutic efficacy or procedural success - to reduce procedural risk  Risks explained and consent was given verbally. The site was cleaned with Chlorhexidine. The suprapatellar pouch of the knee was identified.  A superficial wheal and numbing track was made using 25-gauge needle and 2 mL 1% lidocaine.  An 18-gauge needle was introduced to the pouch under ultrasound guidance. 26mL of clear/straw colored synovial fluid was aspirated. Syringe was exchanged and injection given using 64mL of 1% lidocaine without epinephrine and 29mL of kenalog 40mg /ml. This was well tolerated and resulted in symptomatic relief.  Needle was removed, hemostasis achieved, and post injection instructions were explained.   Pt was advised to call or return to clinic if these symptoms worsen or fail to improve as anticipated.   Pertinent previous records reviewed include sports medicine note 02/10/2022   Follow Up: Patient  may follow-up with Dr. Raeford Razor, sports medicine to continue with ongoing treatment for chronic knee pain   Subjective:   I, Moenique Parris, am serving as a Education administrator for Doctor Glennon Mac  Chief Complaint: left knee  HPI:  07/03/2021 Tiffany Byrd is a 82 y.o. female that is presenting with acute on chronic left knee pain and acute on chronic back pain with left-sided radicular pain.  She reports the knee pain has been bothering her over the past few months.  The back pain is acute on chronic.  Has a history of bone spurs being taken off of her back.  She is still having pain in the back as well as down the left leg.   Review of the lumbar spine x-ray from 2021 shows levoscoliosis with apex at L1-2, an 8 mm left lateral listhesis and a L4-5 3 mm right lateral listhesis with disc degenerative changes and facet arthropathy in the lumbar spine. 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   Current Outpatient Medications:    acetaminophen (TYLENOL) 500 MG tablet, Take 500 mg by mouth every 6 (six) hours as needed  for moderate pain., Disp: , Rfl:    aspirin EC 81 MG tablet, Take 1 tablet (81 mg total) by mouth daily. Swallow whole., Disp: 120 tablet, Rfl: 0   atenolol (TENORMIN) 25 MG tablet, Take 25 mg by mouth at bedtime., Disp: , Rfl:    atorvastatin (LIPITOR) 80 MG tablet, Take 1 tablet (80 mg total) by mouth daily.,  Disp: 30 tablet, Rfl: 0   cholecalciferol (VITAMIN D) 400 units TABS tablet, Take 400 Units by mouth., Disp: , Rfl:    Cyclobenzaprine HCl (FLEXERIL PO), Take 1 tablet by mouth daily as needed (muscle spasms)., Disp: , Rfl:    diclofenac Sodium (VOLTAREN) 1 % GEL, Apply 4 g topically 4 (four) times daily., Disp: 100 g, Rfl: 0   levothyroxine (SYNTHROID) 75 MCG tablet, Take 75 mcg by mouth daily before breakfast., Disp: , Rfl:    Multiple Vitamin (MULTIVITAMIN) LIQD, Take 5 mLs by mouth daily., Disp: , Rfl:    pantoprazole (PROTONIX) 40 MG tablet, Take 1 tablet (40 mg total) by mouth daily., Disp: 30 tablet, Rfl: 0   pregabalin (LYRICA) 75 MG capsule, Take 1 capsule (75 mg total) by mouth 2 (two) times daily. Take 75 mg in morning and 150 mg at night1, Disp: 1 capsule, Rfl: 1   triamterene-hydrochlorothiazide (DYAZIDE) 37.5-25 MG capsule, Take 1 capsule by mouth daily., Disp: , Rfl:   Current Facility-Administered Medications:    triamcinolone acetonide (KENALOG-40) injection 40 mg, 40 mg, Intra-articular, Once, Rosemarie Ax, MD   Objective:     Vitals:   05/15/22 1051  Weight: 173 lb (78.5 kg)  Height: 5\' 1"  (1.549 m)      Body mass index is 32.69 kg/m.    Physical Exam:    General:  awake, alert oriented, no acute distress nontoxic Skin: no suspicious lesions or rashes Neuro:sensation intact and strength 5/5 with no deficits, no atrophy, normal muscle tone Psych: No signs of anxiety, depression or other mood disorder  Left Knee: Moderate swelling No deformity Positive fluid wave, joint milking ROM Flex 90, Ext 10 TTP lateral joint line, lateral femoral condyle NTTP over the quad tendon, medial fem condyle,   patella, plica, patella tendon, tibial tuberostiy, fibular head, posterior fossa, pes anserine bursa, gerdy's tubercle, medial jt line,   Negative varus stress Negative valgus stress  Gait abnormal, using rollator for assistance   Electronically signed by:  Tiffany Byrd D.[facility] 11:21 AM 05/15/22

## 2022-05-19 ENCOUNTER — Ambulatory Visit (INDEPENDENT_AMBULATORY_CARE_PROVIDER_SITE_OTHER): Payer: Medicare Other

## 2022-05-19 DIAGNOSIS — I639 Cerebral infarction, unspecified: Secondary | ICD-10-CM | POA: Diagnosis not present

## 2022-05-19 LAB — CUP PACEART REMOTE DEVICE CHECK
Date Time Interrogation Session: 20240324231335
Implantable Pulse Generator Implant Date: 20231211

## 2022-05-28 NOTE — Progress Notes (Signed)
Carelink Summary Report / Loop Recorder 

## 2022-06-03 ENCOUNTER — Ambulatory Visit: Payer: Medicare Other | Admitting: Adult Health

## 2022-06-11 ENCOUNTER — Encounter: Payer: Self-pay | Admitting: *Deleted

## 2022-06-11 ENCOUNTER — Other Ambulatory Visit: Payer: Self-pay

## 2022-06-11 ENCOUNTER — Emergency Department (HOSPITAL_BASED_OUTPATIENT_CLINIC_OR_DEPARTMENT_OTHER)
Admission: EM | Admit: 2022-06-11 | Discharge: 2022-06-12 | Disposition: A | Discharge status: Home / Self Care | Payer: Medicare Other | Attending: Emergency Medicine | Admitting: Emergency Medicine

## 2022-06-11 ENCOUNTER — Emergency Department (HOSPITAL_BASED_OUTPATIENT_CLINIC_OR_DEPARTMENT_OTHER): Payer: Medicare Other

## 2022-06-11 ENCOUNTER — Encounter (HOSPITAL_BASED_OUTPATIENT_CLINIC_OR_DEPARTMENT_OTHER): Payer: Self-pay

## 2022-06-11 DIAGNOSIS — R42 Dizziness and giddiness: Secondary | ICD-10-CM | POA: Diagnosis present

## 2022-06-11 DIAGNOSIS — R519 Headache, unspecified: Secondary | ICD-10-CM | POA: Diagnosis not present

## 2022-06-11 DIAGNOSIS — R200 Anesthesia of skin: Secondary | ICD-10-CM | POA: Insufficient documentation

## 2022-06-11 DIAGNOSIS — E876 Hypokalemia: Secondary | ICD-10-CM | POA: Insufficient documentation

## 2022-06-11 DIAGNOSIS — Z7982 Long term (current) use of aspirin: Secondary | ICD-10-CM | POA: Insufficient documentation

## 2022-06-11 DIAGNOSIS — Z8673 Personal history of transient ischemic attack (TIA), and cerebral infarction without residual deficits: Secondary | ICD-10-CM | POA: Insufficient documentation

## 2022-06-11 DIAGNOSIS — R531 Weakness: Secondary | ICD-10-CM | POA: Insufficient documentation

## 2022-06-11 LAB — CBC
HCT: 37.1 % (ref 36.0–46.0)
Hemoglobin: 12 g/dL (ref 12.0–15.0)
MCH: 28.7 pg (ref 26.0–34.0)
MCHC: 32.3 g/dL (ref 30.0–36.0)
MCV: 88.8 fL (ref 80.0–100.0)
Platelets: 385 10*3/uL (ref 150–400)
RBC: 4.18 MIL/uL (ref 3.87–5.11)
RDW: 14.6 % (ref 11.5–15.5)
WBC: 10.3 10*3/uL (ref 4.0–10.5)
nRBC: 0 % (ref 0.0–0.2)

## 2022-06-11 LAB — COMPREHENSIVE METABOLIC PANEL
ALT: 20 U/L (ref 0–44)
AST: 18 U/L (ref 15–41)
Albumin: 2.8 g/dL — ABNORMAL LOW (ref 3.5–5.0)
Alkaline Phosphatase: 91 U/L (ref 38–126)
Anion gap: 7 (ref 5–15)
BUN: 17 mg/dL (ref 8–23)
CO2: 20 mmol/L — ABNORMAL LOW (ref 22–32)
Calcium: 6.5 mg/dL — ABNORMAL LOW (ref 8.9–10.3)
Chloride: 112 mmol/L — ABNORMAL HIGH (ref 98–111)
Creatinine, Ser: 0.81 mg/dL (ref 0.44–1.00)
GFR, Estimated: >60 mL/min (ref >60)
Glucose, Bld: 103 mg/dL — ABNORMAL HIGH (ref 70–99)
Potassium: 2.7 mmol/L — CL (ref 3.5–5.1)
Sodium: 139 mmol/L (ref 135–145)
Total Bilirubin: 0.2 mg/dL — ABNORMAL LOW (ref 0.3–1.2)
Total Protein: 5.4 g/dL — ABNORMAL LOW (ref 6.5–8.1)

## 2022-06-11 LAB — DIFFERENTIAL
Abs Immature Granulocytes: 0.04 10*3/uL (ref 0.00–0.07)
Basophils Absolute: 0.1 10*3/uL (ref 0.0–0.1)
Basophils Relative: 1 %
Eosinophils Absolute: 0.4 10*3/uL (ref 0.0–0.5)
Eosinophils Relative: 4 %
Immature Granulocytes: 0 %
Lymphocytes Relative: 20 %
Lymphs Abs: 2.1 10*3/uL (ref 0.7–4.0)
Monocytes Absolute: 1 10*3/uL (ref 0.1–1.0)
Monocytes Relative: 10 %
Neutro Abs: 6.8 10*3/uL (ref 1.7–7.7)
Neutrophils Relative %: 65 %

## 2022-06-11 LAB — CBG MONITORING, ED: Glucose-Capillary: 111 mg/dL — ABNORMAL HIGH (ref 70–99)

## 2022-06-11 LAB — PROTIME-INR
INR: 1.1 (ref 0.8–1.2)
Prothrombin Time: 13.7 seconds (ref 11.4–15.2)

## 2022-06-11 LAB — ETHANOL: Alcohol, Ethyl (B): <10 mg/dL (ref <10)

## 2022-06-11 LAB — APTT: aPTT: 28 seconds (ref 24–36)

## 2022-06-11 MED ORDER — MAGNESIUM OXIDE -MG SUPPLEMENT 400 (240 MG) MG PO TABS
400.0000 mg | ORAL_TABLET | Freq: Once | ORAL | Status: AC
  Administered 2022-06-11: 400 mg via ORAL
  Filled 2022-06-11: qty 1

## 2022-06-11 MED ORDER — MECLIZINE HCL 25 MG PO TABS
25.0000 mg | ORAL_TABLET | Freq: Once | ORAL | Status: AC
  Administered 2022-06-11: 25 mg via ORAL
  Filled 2022-06-11: qty 1

## 2022-06-11 MED ORDER — POTASSIUM CHLORIDE 20 MEQ PO PACK
40.0000 meq | PACK | Freq: Once | ORAL | Status: AC
  Administered 2022-06-11: 40 meq via ORAL
  Filled 2022-06-11: qty 2

## 2022-06-11 MED ORDER — SODIUM CHLORIDE 0.9% FLUSH
3.0000 mL | Freq: Once | INTRAVENOUS | Status: AC
  Administered 2022-06-11: 3 mL via INTRAVENOUS
  Filled 2022-06-11: qty 3

## 2022-06-11 MED ORDER — POTASSIUM CHLORIDE CRYS ER 20 MEQ PO TBCR: 20.0000 meq | EXTENDED_RELEASE_TABLET | Freq: Two times a day (BID) | ORAL | 0 refills | Status: AC

## 2022-06-11 MED ORDER — POTASSIUM CHLORIDE CRYS ER 20 MEQ PO TBCR: 20.0000 meq | EXTENDED_RELEASE_TABLET | Freq: Two times a day (BID) | ORAL | 0 refills | Status: DC

## 2022-06-11 MED ORDER — MECLIZINE HCL 25 MG PO TABS: 25.0000 mg | ORAL_TABLET | Freq: Three times a day (TID) | ORAL | 0 refills | Status: AC | PRN

## 2022-06-11 NOTE — ED Triage Notes (Signed)
Pt presents following a fall 1 month ago. Reports pain in back of head and neck worsening as well as dizziness. Pt reports taking 81 mg ASA daily following CVA in May 2023.   Pt reports dizziness has worsened today, with numbness in leg beginning at approximately 3 pm.

## 2022-06-11 NOTE — ED Provider Notes (Signed)
[place] [facility] Provider Note   CSN: [id number] Arrival date & time: 06/11/22  2053     History Chief Complaint  Patient presents with   Dizziness   Weakness    HPI Tiffany Byrd is a 82 y.o. female presenting for episode of dizziness. Patient states that she has had approximately 1 month of intermittent dizziness.  States that it hits her intermittently throughout this time.  She felt completely asymptomatic this morning however approximately 2 PM she started having headache and intermittent dizziness.  She endorses it was associated with worsening numbness of her left hand (she has a unknown CVA with baseline numbness of this hand but states that it was more noticeable).  Patient's recorded medical, surgical, social, medication list and allergies were reviewed in the Snapshot window as part of the initial history.   Review of Systems   Review of Systems  Constitutional:  Negative for chills and fever.  HENT:  Negative for ear pain and sore throat.   Eyes:  Negative for pain and visual disturbance.  Respiratory:  Negative for cough and shortness of breath.   Cardiovascular:  Negative for chest pain and palpitations.  Gastrointestinal:  Negative for abdominal pain and vomiting.  Genitourinary:  Negative for dysuria and hematuria.  Musculoskeletal:  Negative for arthralgias and back pain.  Skin:  Negative for color change and rash.  Neurological:  Positive for dizziness. Negative for seizures and syncope.  All other systems reviewed and are negative.   Physical Exam Updated Vital Signs BP (!) 153/84 (BP Location: Left Arm)   Pulse 70   Temp 98 F (36.7 C)   Resp 20   Ht 5' (1.524 m)   Wt 77.1 kg   SpO2 99%   BMI 33.20 kg/m  Physical Exam Vitals and nursing note reviewed.  Constitutional:      General: She is not in acute distress.    Appearance: She is well-developed.  HENT:     Head: Normocephalic and atraumatic.  Eyes:      Conjunctiva/sclera: Conjunctivae normal.  Cardiovascular:     Rate and Rhythm: Normal rate and regular rhythm.     Heart sounds: No murmur heard. Pulmonary:     Effort: Pulmonary effort is normal. No respiratory distress.     Breath sounds: Normal breath sounds.  Abdominal:     General: There is no distension.     Palpations: Abdomen is soft.     Tenderness: There is no abdominal tenderness. There is no right CVA tenderness or left CVA tenderness.  Musculoskeletal:        General: No swelling or tenderness. Normal range of motion.     Cervical back: Neck supple.  Skin:    General: Skin is warm and dry.  Neurological:     General: No focal deficit present.     Mental Status: She is alert and oriented to person, place, and time. Mental status is at baseline.     Cranial Nerves: No cranial nerve deficit.     Comments: I personally performed a HINTS exam  Head impulse test: Patient does have a corrective saccade to the right. Nystagmus: Patient does have nystagmus to the right on contralateral directional gaze. Test of skew: No skew. Hints exam localizes to the peripheral nervous system.       ED Course/ Medical Decision Making/ A&P   Procedures Procedures   Medications Ordered in ED Medications  sodium chloride flush (NS) 0.9 % injection  3 mL (3 mLs Intravenous Given 06/11/22 2258)  meclizine (ANTIVERT) tablet 25 mg (25 mg Oral Given 06/11/22 2216)  potassium chloride (KLOR-CON) packet 40 mEq (40 mEq Oral Given 06/11/22 2304)  magnesium oxide (MAG-OX) tablet 400 mg (400 mg Oral Given 06/11/22 2306)    Medical Decision Making:    Tiffany Byrd is a 82 y.o. female who presented to the ED today with episode of dizziness detailed above.    Complete initial physical exam performed, notably the patient  was hemodynamically stable in no acute distress..      Reviewed and confirmed nursing documentation for past medical history, family history, social history.    Initial  Assessment:   With the patient's presentation of episodic dizziness, most likely diagnosis is benign vertigo based on this physical exam finding. Other diagnoses were considered including (but not limited to) CVA, intracranial hemorrhage, critical anemia, underlying infection, intracranial mass. These are considered less likely due to history of present illness and physical exam findings.   This is most consistent with an acute life/limb threatening illness complicated by underlying chronic conditions.  Initial Plan:  CT head to evaluate for intracranial pathology Screening labs including CBC and Metabolic panel to evaluate for infectious or metabolic etiology of disease.  Urinalysis with reflex culture ordered to evaluate for UTI or relevant urologic/nephrologic pathology.  EKG to evaluate for cardiac pathology. Objective evaluation as below reviewed with plan for close reassessment  Initial Study Results:   Laboratory  All laboratory results reviewed without evidence of clinically relevant pathology.   Exceptions include: Hypokalemia  EKG EKG was reviewed independently. Rate, rhythm, axis, intervals all examined and without medically relevant abnormality. ST segments without concerns for elevations.    Radiology  All images reviewed independently. Agree with radiology report at this time.   CT HEAD WO CONTRAST  Result Date: 06/11/2022 CLINICAL DATA:  Acute neurologic deficit EXAM: CT HEAD WITHOUT CONTRAST TECHNIQUE: Contiguous axial images were obtained from the base of the skull through the vertex without intravenous contrast. RADIATION DOSE REDUCTION: This exam was performed according to the departmental dose-optimization program which includes automated exposure control, adjustment of the mA and/or kV according to patient size and/or use of iterative reconstruction technique. COMPARISON:  Brain MRI 07/14/2021 FINDINGS: Brain: There is no mass, hemorrhage or extra-axial collection. The  size and configuration of the ventricles and extra-axial CSF spaces are normal. The brain parenchyma is normal, without acute or chronic infarction. Vascular: No abnormal hyperdensity of the major intracranial arteries or dural venous sinuses. No intracranial atherosclerosis. Skull: The visualized skull base, calvarium and extracranial soft tissues are normal. Sinuses/Orbits: No fluid levels or advanced mucosal thickening of the visualized paranasal sinuses. No mastoid or middle ear effusion. The orbits are normal. IMPRESSION: No acute intracranial abnormality. Electronically Signed   By: Deatra Robinson M.D.   On: 06/11/2022 21:44     Final Assessment and Plan:   Patient's history of present illness and physical exam findings are not consistent with any acute pathology.  Patient was treated with p.o. potassium and observed for 2 hours.  Patient symptomatically resolved on reassessment at this time.  Given negative CT head, resolution of symptoms. Will treat with potassium 20 mg twice daily for 5 days, this is likely hypokalemia secondary to hydrochlorothiazide. Will keep patient with Antivert as needed.  Will refer to neurology for outpatient care management   Disposition:  I have considered need for hospitalization, however, considering all of the above, I believe this patient is  stable for discharge at this time.  Patient/family educated about specific return precautions for given chief complaint and symptoms.  Patient/family educated about follow-up with PCP.     Patient/family expressed understanding of return precautions and need for follow-up. Patient spoken to regarding all imaging and laboratory results and appropriate follow up for these results. All education provided in verbal form with additional information in written form. Time was allowed for answering of patient questions. Patient discharged.    Emergency Department Medication Summary:   Medications  sodium chloride flush (NS) 0.9 %  injection 3 mL (3 mLs Intravenous Given 06/11/22 2258)  meclizine (ANTIVERT) tablet 25 mg (25 mg Oral Given 06/11/22 2216)  potassium chloride (KLOR-CON) packet 40 mEq (40 mEq Oral Given 06/11/22 2304)  magnesium oxide (MAG-OX) tablet 400 mg (400 mg Oral Given 06/11/22 2306)     Clinical Impression:  1. Hypokalemia   2. Vertigo   3. Dizziness      Data Unavailable   Final Clinical Impression(s) / ED Diagnoses Final diagnoses:  Hypokalemia  Vertigo  Dizziness    Rx / DC Orders ED Discharge Orders          Ordered    potassium chloride SA (KLOR-CON M) 20 MEQ tablet  2 times daily,   Status:  Discontinued        06/11/22 2336    potassium chloride SA (KLOR-CON M) 20 MEQ tablet  2 times daily        06/11/22 2337    meclizine (ANTIVERT) 25 MG tablet  3 times daily PRN        06/11/22 2337              Glyn Ade, MD 06/11/22 2337

## 2022-06-23 ENCOUNTER — Ambulatory Visit (INDEPENDENT_AMBULATORY_CARE_PROVIDER_SITE_OTHER): Payer: Medicare Other

## 2022-06-23 DIAGNOSIS — I639 Cerebral infarction, unspecified: Secondary | ICD-10-CM | POA: Diagnosis not present

## 2022-06-23 LAB — CUP PACEART REMOTE DEVICE CHECK
Date Time Interrogation Session: 20240426230305
Implantable Pulse Generator Implant Date: 20231211

## 2022-06-30 NOTE — Progress Notes (Signed)
Carelink Summary Report / Loop Recorder 

## 2022-07-08 ENCOUNTER — Encounter: Payer: Self-pay | Admitting: Neurology

## 2022-07-08 ENCOUNTER — Ambulatory Visit: Payer: Medicare Other | Admitting: Neurology

## 2022-07-08 VITALS — BP 124/68 | HR 67 | Ht 60.0 in | Wt 176.2 lb

## 2022-07-08 DIAGNOSIS — Z8673 Personal history of transient ischemic attack (TIA), and cerebral infarction without residual deficits: Secondary | ICD-10-CM | POA: Diagnosis not present

## 2022-07-08 DIAGNOSIS — I6990 Unspecified sequelae of unspecified cerebrovascular disease: Secondary | ICD-10-CM

## 2022-07-08 NOTE — Patient Instructions (Signed)
I had a long d/w patient about her cryptogenic stroke, risk for recurrent stroke/TIAs, personally independently reviewed imaging studies and stroke evaluation results and answered questions.Continue aspirin 81 mg daily  for secondary stroke prevention and maintain strict control of hypertension with blood pressure goal below 130/90, diabetes with hemoglobin A1c goal below 6.5% and lipids with LDL cholesterol goal below 70 mg/dL. I also advised the patient to eat a healthy diet with plenty of whole grains, cereals, fruits and vegetables, exercise regularly and maintain ideal body weight .  Check screening carotid ultrasound.  Patient is neurologically cleared for left knee replacement surgery.  Followup in the future with Shanda Bumps, nurse practitioner in 6 months or call earlier if necessary.

## 2022-07-08 NOTE — Progress Notes (Signed)
[facility] [address] [place]. [place] [address] [phone]       OFFICE FOLLOW-UP VISIT NOTE  Ms. Tiffany Byrd Date of Birth:  [birth year] Medical Record Number:  [id number]   Referring MD: Lynden Oxford  Reason for Referral: Stroke  HPI: Initial visit 09/19/2021 Tiffany Byrd is a 82 year old lady seen today for initial office consultation visit for stroke.  History is obtained from the patient and review of electronic medical records and I have also personally reviewed pertinent available imaging films in PACS.  She has past medical history of thyroid disease, hypertension, hyperlipidemia and gastroesophageal reflux disease.  She presented to [facility] emergency room with sudden onset of left hand weakness, heaviness and clumsiness restarted at 11:30 AM on 07/13/2021.  She woke up that morning feeling fine and symptoms appear later on.  The ED provider note noticed some ataxia on the left and hence stroke work-up was done.  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  MRI scan showed a small right frontal motor strip embolic infarct.  LDL cholesterol is 93 mg percent hemoglobin A1c of 6.2.  She was started on aspirin and Plavix for 3 weeks followed by aspirin alone.  She was also started on Lipitor 80 mg daily which is tolerating well without side effects and follow-up lipid profile on 09/12/2021 showed improvement with LDL cholesterol down to 57 mg percent.  She also wore a 30-day heart monitor from 07/26/2021 to 08/28/2021 which was negative for atrial fibrillation or cardiac arrhythmias.  Patient states left-sided weakness has improved she still has some minor paresthesias at the tip of the fingers but she has good strength.  She is finished outpatient physical and Occupational Therapy.  She is tolerating aspirin well with only minor bruising and no  bleeding.  She is still doing some home exercises regularly.  Her primary care physician increase the dose of Lyrica to 75 mg which seems to have helped with the left-sided paresthesias as well.  She has no complaints today.  She has no prior history of stroke TIAs, migraines, seizures or any neurological problems.  She has arthritis in her knee with severe pain and walks with a walker. Updated 12/23/2021 : She returns for follow-up after last visit 3 months ago.  She is accompanied by her son.  Continues to have paresthesias in the right hand, stroke the primary care physician has recently increased her Lyrica to 75 mg in morning and 150 at night which seems to be helping ambulate with a walker due to arthritis in her left knee and right hip.  She has not had any recurrent stroke or TIA symptoms.  She remains on aspirin which she is tolerating well without any bruising or bleeding.  Her blood pressure is well controlled and today it is 130/74.  I recommended a loop recorder at last visit but for unclear reasons that has not yet happened.  She is tolerating Lipitor well without muscle aches and pains.  Last lipid profile 11/01/2021 showed LDL cholesterol to be optimal at 35 mg%.  She has no new complaints. Update 07/08/2022 ; she returns for follow-up after last visit 7 months ago.  She is doing well from neurovascular standpoint without recurrent stroke or TIA symptoms.  She remains on aspirin which she is tolerating well with minor bruising and no bleeding.  She states her blood pressure  is under good control.  She is tolerating Lipitor well without muscle aches and pains.  Lab work on 05/16/2022 showed LDL cholesterol to be optimal at 54 mg percent and hemoglobin A1c 24 was 6.2.  She states she is eating healthy and she walks daily.  She however has not lost significant weight.  She is bothered by chronic left knee pain.  Seen at [facility] ER on 06/11/2022 for occipital headache and dizziness.  CT head was  unremarkable.  Now resolved.  Has some mild numbness in the left is not bothersome.  She has no new complaints today ROS:   14 system review of systems is positive for dizziness, occipital headache numbness, weakness, bruising, knee pain, difficulty walking all other systems negative  PMH:  Past Medical History:  Diagnosis Date   GERD (gastroesophageal reflux disease)    Hyperlipidemia    Stroke [facility])    Thyroid disease     Social History:  Social History   Socioeconomic History   Marital status: Widowed    Spouse name: Not on file   Number of children: Not on file   Years of education: Not on file   Highest education level: Not on file  Occupational History   Not on file  Tobacco Use   Smoking status: Never   Smokeless tobacco: Never  Substance and Sexual Activity   Alcohol use: Not on file   Drug use: Not on file   Sexual activity: Not on file  Other Topics Concern   Not on file  Social History Narrative   Not on file   Social Determinants of Health   Financial Resource Strain: Not on file  Food Insecurity: Not on file  Transportation Needs: Not on file  Physical Activity: Not on file  Stress: Not on file  Social Connections: Not on file  Intimate Partner Violence: Not on file    Medications:   Current Outpatient Medications on File Prior to Visit  Medication Sig Dispense Refill   acetaminophen (TYLENOL) 500 MG tablet Take 500 mg by mouth every 6 (six) hours as needed for moderate pain.     aspirin EC 81 MG tablet Take 1 tablet (81 mg total) by mouth daily. Swallow whole. 120 tablet 0   atenolol (TENORMIN) 25 MG tablet Take 25 mg by mouth at bedtime.     atorvastatin (LIPITOR) 80 MG tablet Take 1 tablet (80 mg total) by mouth daily. 30 tablet 0   cholecalciferol (VITAMIN D) 400 units TABS tablet Take 400 Units by mouth.     Cyclobenzaprine HCl (FLEXERIL PO) Take 1 tablet by mouth daily as needed (muscle spasms).     diclofenac Sodium (VOLTAREN) 1 % GEL Apply  4 g topically 4 (four) times daily. 100 g 0   levothyroxine (SYNTHROID) 75 MCG tablet Take 75 mcg by mouth daily before breakfast.     meclizine (ANTIVERT) 25 MG tablet Take 1 tablet (25 mg total) by mouth 3 (three) times daily as needed for dizziness. 30 tablet 0   Multiple Vitamin (MULTIVITAMIN) LIQD Take 5 mLs by mouth daily.     pantoprazole (PROTONIX) 40 MG tablet Take 1 tablet (40 mg total) by mouth daily. 30 tablet 0   pregabalin (LYRICA) 75 MG capsule Take 1 capsule (75 mg total) by mouth 2 (two) times daily. Take 75 mg in morning and 150 mg at night1 1 capsule 1   triamterene-hydrochlorothiazide (DYAZIDE) 37.5-25 MG capsule Take 1 capsule by mouth daily.  potassium chloride SA (KLOR-CON M) 20 MEQ tablet Take 1 tablet (20 mEq total) by mouth 2 (two) times daily for 5 days. 10 tablet 0   Current Facility-Administered Medications on File Prior to Visit  Medication Dose Route Frequency Provider Last Rate Last Admin   triamcinolone acetonide (KENALOG-40) injection 40 mg  40 mg Intra-articular Once Myra Rude, MD        Allergies:   Allergies  Allergen Reactions   Latex    Penicillins    Hydrocodone-Acetaminophen Anxiety    Physical Exam General: Mildly obese pleasant elderly African-American lady, seated, in no evident distress Head: head normocephalic and atraumatic.   Neck: supple with no carotid or supraclavicular bruits Cardiovascular: regular rate and rhythm, no murmurs Musculoskeletal: no deformity Skin:  no rash/petichiae Vascular:  Normal pulses all extremities  Neurologic Exam Mental Status: Awake and fully alert. Oriented to place and time. Recent and remote memory intact. Attention span, concentration and fund of knowledge appropriate. Mood and affect appropriate.  Cranial Nerves: Fundoscopic exam not done. Pupils equal, briskly reactive to light. Extraocular movements full without nystagmus. Visual fields full to confrontation. Hearing intact. Facial  sensation intact. Face, tongue, palate moves normally and symmetrically.  Motor: Normal bulk and tone. Normal strength in all tested extremity muscles. Finger movements on the right.  Orbits left or right upper extremity. Sensory.: intact to touch , pinprick , position and vibratory sensation.  Coordination: Rapid alternating movements normal in all extremities. Finger-to-nose and heel-to-shin performed accurately bilaterally. Gait and Station: Arises from chair without difficulty. Stance is normal.  Gait is cautious favors left knee due to arthritis pain Reflexes: 1+ and symmetric. Toes downgoing.      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  Patient is done very well with minimal residual deficits.  Vascular risk factors of hypertension, mild obesity and hyperlipidemia.     PLAN: I had a long d/w patient about her cryptogenic stroke, risk for recurrent stroke/TIAs, personally independently reviewed imaging studies and stroke evaluation results and answered questions.Continue aspirin 81 mg daily  for secondary stroke prevention and maintain strict control of hypertension with blood pressure goal below 130/90, diabetes with hemoglobin A1c goal below 6.5% and lipids with LDL cholesterol goal below 70 mg/dL. I also advised the patient to eat a healthy diet with plenty of whole grains, cereals, fruits and vegetables, exercise regularly and maintain ideal body weight .  Check screening carotid ultrasound.  Patient is neurologically cleared for left knee replacement surgery.  Followup in the future with Shanda Bumps, nurse practitioner in 6 months or call earlier if necessary.  reater than 50% time during this 35-minute visit was spent on counseling and coordination of care about her cryptogenic stroke and discussion about evaluation and treatment and answering questions. Delia Heady, MD  Note: This document was prepared with digital dictation  and possible smart phrase technology. Any transcriptional errors that result from this process are unintentional.

## 2022-07-22 ENCOUNTER — Ambulatory Visit (HOSPITAL_COMMUNITY)
Admission: RE | Admit: 2022-07-22 | Discharge: 2022-07-22 | Disposition: A | Discharge status: Home / Self Care | Payer: Medicare Other | Source: Ambulatory Visit | Attending: Neurology | Admitting: Neurology

## 2022-07-22 DIAGNOSIS — Z8673 Personal history of transient ischemic attack (TIA), and cerebral infarction without residual deficits: Secondary | ICD-10-CM | POA: Diagnosis present

## 2022-07-22 NOTE — Progress Notes (Signed)
Bilateral carotid ultrasound study completed.   Please see CV Procedures for preliminary results.  Skye Rodarte, RVT  12:59 PM 07/22/22

## 2022-07-23 ENCOUNTER — Telehealth: Payer: Self-pay | Admitting: Anesthesiology

## 2022-07-23 NOTE — Progress Notes (Signed)
Kindly inform the patient that carotid ultrasound study did not show significant narrowing of either carotid artery in the neck.

## 2022-07-23 NOTE — Telephone Encounter (Signed)
-----   Message from Deatra James, RN sent at 07/23/2022 11:59 AM EDT -----  ----- Message ----- From: Micki Riley, MD Sent: 07/23/2022  11:55 AM EDT To: Gna-Pod 2 Results  Kindly inform the patient that carotid ultrasound study did not show significant narrowing of either carotid artery in the neck.

## 2022-07-23 NOTE — Telephone Encounter (Signed)
Left message for pt to call back regarding carotid U/S results.

## 2022-07-24 NOTE — Progress Notes (Signed)
Carelink Summary Report / Loop Recorder 

## 2022-07-28 ENCOUNTER — Ambulatory Visit (INDEPENDENT_AMBULATORY_CARE_PROVIDER_SITE_OTHER): Payer: Medicare Other

## 2022-07-28 DIAGNOSIS — I639 Cerebral infarction, unspecified: Secondary | ICD-10-CM | POA: Diagnosis not present

## 2022-07-28 LAB — CUP PACEART REMOTE DEVICE CHECK
Date Time Interrogation Session: 20240602231016
Implantable Pulse Generator Implant Date: 20231211

## 2022-07-31 ENCOUNTER — Ambulatory Visit: Payer: Medicare Other | Admitting: Sports Medicine

## 2022-07-31 VITALS — BP 130/82 | HR 68 | Ht 60.0 in | Wt 176.0 lb

## 2022-07-31 DIAGNOSIS — M1712 Unilateral primary osteoarthritis, left knee: Secondary | ICD-10-CM

## 2022-07-31 NOTE — Patient Instructions (Addendum)
Ortho referral  As needed follow up

## 2022-07-31 NOTE — Progress Notes (Signed)
Aleen Sells D.[facility] [address] [place] [address] Phone: [phone]   Assessment and Plan:     1. Primary osteoarthritis of left knee  -Chronic exacerbation, subsequent visit - Recurrence of left knee pain consistent with flare of osteoarthritis with severe tricompartmental changes seen with most recent x-ray in 04/2022 -Patient has been receiving decreased relief for short periods of time with each intra-articular injection which have included Zilretta and CSI.  Patient only received a few weeks relief after most recent CSI on 05/15/2022.  At this point patient is requesting referral to orthopedic surgery to discuss surgical options which I agree is a reasonable next course of action - Patient requested additional CSI today to help with pain relief until she can be evaluated.  Injection provided and tolerated well per note below.  CSI may temporarily increase blood pressure in patient with past medical history of hypertension  Procedure: Knee Joint Injection Side: Left Indication: Flare of osteoarthritis  Risks explained and consent was given verbally. The site was cleaned with alcohol prep. A needle was introduced with an anterio-lateral approach. Injection given using 2mL of 1% lidocaine without epinephrine and 1mL of kenalog 40mg /ml. This was well tolerated and resulted in symptomatic relief.  Needle was removed, hemostasis achieved, and post injection instructions were explained.   Pt was advised to call or return to clinic if these symptoms worsen or fail to improve as anticipated.    Pertinent previous records reviewed include none   Follow Up: As needed   Subjective:   I, Jerene Canny, am serving as a Neurosurgeon for Doctor Richardean Sale   Chief Complaint: left knee   HPI:  07/03/2021 Tiffany Byrd is a 82 y.o. female that is presenting with acute on chronic left knee pain and acute on chronic back pain with left-sided  radicular pain.  She reports the knee pain has been bothering her over the past few months.  The back pain is acute on chronic.  Has a history of bone spurs being taken off of her back.  She is still having pain in the back as well as down the left leg.   Review of the lumbar spine x-ray from 2021 shows levoscoliosis with apex at L1-2, an 8 mm left lateral listhesis and a L4-5 3 mm right lateral listhesis with disc degenerative changes and facet arthropathy in the lumbar spine. 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   Current Outpatient Medications:    acetaminophen (TYLENOL) 500 MG tablet, Take 500 mg by mouth every 6 (six) hours as needed for moderate pain., Disp: , Rfl:    aspirin EC 81 MG tablet, Take 1 tablet (81 mg total) by mouth daily. Swallow whole., Disp: 120 tablet, Rfl:  0   atenolol (TENORMIN) 25 MG tablet, Take 25 mg by mouth at bedtime., Disp: , Rfl:    atorvastatin (LIPITOR) 80 MG tablet, Take 1 tablet (80 mg total) by mouth daily., Disp: 30 tablet, Rfl: 0   cholecalciferol (VITAMIN D) 400 units TABS tablet, Take 400 Units by mouth., Disp: , Rfl:    Cyclobenzaprine HCl (FLEXERIL PO), Take 1 tablet by mouth daily as needed (muscle spasms)., Disp: , Rfl:    diclofenac Sodium (VOLTAREN) 1 % GEL, Apply 4 g  topically 4 (four) times daily., Disp: 100 g, Rfl: 0   levothyroxine (SYNTHROID) 75 MCG tablet, Take 75 mcg by mouth daily before breakfast., Disp: , Rfl:    meclizine (ANTIVERT) 25 MG tablet, Take 1 tablet (25 mg total) by mouth 3 (three) times daily as needed for dizziness., Disp: 30 tablet, Rfl: 0   Multiple Vitamin (MULTIVITAMIN) LIQD, Take 5 mLs by mouth daily., Disp: , Rfl:    pantoprazole (PROTONIX) 40 MG tablet, Take 1 tablet (40 mg total) by mouth daily., Disp: 30 tablet, Rfl: 0   pregabalin (LYRICA) 75 MG capsule, Take 1 capsule (75 mg total) by mouth 2 (two) times daily. Take 75 mg in morning and 150 mg at night1, Disp: 1 capsule, Rfl: 1   triamterene-hydrochlorothiazide (DYAZIDE) 37.5-25 MG capsule, Take 1 capsule by mouth daily., Disp: , Rfl:    potassium chloride SA (KLOR-CON M) 20 MEQ tablet, Take 1 tablet (20 mEq total) by mouth 2 (two) times daily for 5 days., Disp: 10 tablet, Rfl: 0  Current Facility-Administered Medications:    triamcinolone acetonide (KENALOG-40) injection 40 mg, 40 mg, Intra-articular, Once, Myra Rude, MD   Objective:     Vitals:   07/31/22 1121  BP: 130/82  Pulse: 68  SpO2: 94%  Weight: 176 lb (79.8 kg)  Height: 5' (1.524 m)      Body mass index is 34.37 kg/m.    Physical Exam:    General:  awake, alert oriented, no acute distress nontoxic Skin: no suspicious lesions or rashes Neuro:sensation intact and strength 5/5 with no deficits, no atrophy, normal muscle tone Psych: No signs of anxiety, depression or other mood disorder   Left Knee: Moderate swelling No deformity Positive fluid wave, joint milking ROM Flex 90, Ext 10 TTP lateral joint line, lateral femoral condyle NTTP over the quad tendon, medial fem condyle,   patella, plica, patella tendon, tibial tuberostiy, fibular head, posterior fossa, pes anserine bursa, gerdy's tubercle, medial jt line,   Negative varus stress Negative valgus stress   Gait abnormal, using rollator  for assistance    Electronically signed by:  Aleen Sells D.[facility] 11:38 AM 07/31/22

## 2022-08-19 NOTE — Progress Notes (Signed)
Carelink Summary Report / Loop Recorder 

## 2022-09-01 ENCOUNTER — Ambulatory Visit: Payer: Medicare Other

## 2022-09-01 DIAGNOSIS — I639 Cerebral infarction, unspecified: Secondary | ICD-10-CM

## 2022-09-01 LAB — CUP PACEART REMOTE DEVICE CHECK
Date Time Interrogation Session: 20240705230526
Implantable Pulse Generator Implant Date: 20231211

## 2022-09-02 NOTE — Therapy (Incomplete)
OUTPATIENT PHYSICAL THERAPY LOWER EXTREMITY EVALUATION   Patient Name: Tiffany Byrd MRN: [id number] DOB:1940/12/06, 82 y.o., female Today's Date: 09/02/2022   END OF SESSION:   Past Medical History:  Diagnosis Date   GERD (gastroesophageal reflux disease)    Hyperlipidemia    Stroke [facility])    Thyroid disease    Past Surgical History:  Procedure Laterality Date   ABDOMINAL HYSTERECTOMY     CHOLECYSTECTOMY     LUMBAR LAMINECTOMY/DECOMPRESSION MICRODISCECTOMY     REPLACEMENT TOTAL KNEE     Patient Active Problem List   Diagnosis Date Noted   Gait difficulty 02/03/2022   Metabolic bone disease 11/05/2021   Benign hypertension with CKD (chronic kidney disease) stage III (HCC) 07/29/2021   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 07/29/2021   Acute stroke due to ischemia (HCC) 07/14/2021   Mixed hyperlipidemia 07/14/2021   Essential hypertension 07/14/2021   GERD without esophagitis 07/14/2021   Facet arthropathy, lumbosacral 07/03/2021   Lumbar radiculopathy 07/03/2021   Chronic kidney disease, stage 3a (HCC) 04/30/2021   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   Closed fracture of left tibial plateau 04/09/2020   History of total right knee replacement 03/05/2020   Primary osteoarthritis of right hip 03/05/2020   Primary osteoarthritis of left knee 03/05/2020   PVD (posterior vitreous detachment), both eyes 08/04/2019   Acute renal insufficiency 06/22/2019   Greater trochanteric bursitis of left hip 10/06/2018   Episodic tension-type headache, not intractable 03/17/2018   Status post lumbar spine operative procedure for decompression of spinal cord 11/19/2016   Sensorineural hearing loss, bilateral 05/15/2015   Hallux valgus with bunions of right foot 04/04/2015   Hypothyroidism 04/04/2015   Other chondrocalcinosis, unspecified site 10/20/2014   Pseudogout of left knee 07/06/2014   Primary localized osteoarthrosis  of ankle and foot 04/11/2008    PCP: Raynelle Jan., MD   REFERRING PROVIDER: Jodi Geralds, MD   REFERRING DIAG: ***M25.569 (ICD-10-CM) - Knee pain   THERAPY DIAG:  No diagnosis found.  RATIONALE FOR EVALUATION AND TREATMENT: Rehabilitation  ONSET DATE: ***  NEXT MD VISIT: ***   SUBJECTIVE:                                                                                                                                                                                                         SUBJECTIVE STATEMENT: ***  PAIN: Are you having pain? {OPRCPAIN:27236}  PERTINENT HISTORY:  ***R TKA; OA - R hip, L knee, ankle and foot; L hip greater trochanteric bursitis; CVA - ?***; lumbar  lami/decompression/microdiscectomy;  metabolic bone disease; HTN; hypothyroidism; hearing loss   PRECAUTIONS: {Therapy precautions:24002}  WEIGHT BEARING RESTRICTIONS: {Yes ***/No:24003}  FALLS:  Has patient fallen in last 6 months? {fallsyesno:27318}  LIVING ENVIRONMENT: Lives with: {OPRC lives with:25569::"lives with their family"} Lives in: {Lives in:25570} Stairs: {opstairs:27293} Has following equipment at home: {Assistive devices:23999}  OCCUPATION: ***  PLOF: {PLOF:24004}  PATIENT GOALS: ***   OBJECTIVE: (objective measures completed at initial evaluation unless otherwise dated)  DIAGNOSTIC FINDINGS:  ***05/15/22 - L knee x-ray:  IMPRESSION: No acute finding. No change in advanced tricompartmental osteoarthritis. Small joint effusion.  PATIENT SURVEYS:  {rehab surveys:24030}  COGNITION: Overall cognitive status: {cognition:24006}    SENSATION: {sensation:27233}  EDEMA:  {edema:24020}  MUSCLE LENGTH: Hamstrings: Right *** deg; Left *** deg Maisie Fus test: Right *** deg; Left *** deg Hamstrings: *** ITB: *** Piriformis: *** Hip flexors: *** Quads: *** Heelcord: ***  POSTURE:  {posture:25561}  PALPATION: ***  LOWER EXTREMITY ROM:  {AROM/PROM:27142} ROM  Right eval Left   eval  Hip flexion    Hip extension    Hip abduction    Hip adduction    Hip internal rotation    Hip external rotation    Knee flexion    Knee extension    Ankle dorsiflexion    Ankle plantarflexion    Ankle inversion    Ankle eversion     {AROM/PROM:27142} ROM Right eval Left   eval  Hip flexion    Hip extension    Hip abduction    Hip adduction    Hip internal rotation    Hip external rotation    Knee flexion    Knee extension    Ankle dorsiflexion    Ankle plantarflexion    Ankle inversion    Ankle eversion    (Blank rows = not tested)  LOWER EXTREMITY MMT:  MMT Right eval Left   eval  Hip flexion    Hip extension    Hip abduction    Hip adduction    Hip internal rotation    Hip external rotation    Knee flexion    Knee extension    Ankle dorsiflexion    Ankle plantarflexion    Ankle inversion    Ankle eversion     (Blank rows = not tested)  LOWER EXTREMITY SPECIAL TESTS:  {LEspecialtests:26242}  FUNCTIONAL TESTS:  {Functional tests:24029}  GAIT: Distance walked: *** Assistive device utilized: {Assistive devices:23999} Level of assistance: {Levels of assistance:24026} Gait pattern: {gait characteristics:25376} Comments: ***   TODAY'S TREATMENT:  ***   PATIENT EDUCATION:  Education details: {Education details:27468}  Person educated: {Person educated:25204} Education method: {Education Method:25205} Education comprehension: {Education Comprehension:25206}  HOME EXERCISE PROGRAM: ***   ASSESSMENT:  CLINICAL IMPRESSION: Tiffany Byrd is a 82 y.o. female who was seen today for physical therapy evaluation and treatment for ***.    OBJECTIVE IMPAIRMENTS: {opptimpairments:25111}.   ACTIVITY LIMITATIONS: {activitylimitations:27494}  PARTICIPATION LIMITATIONS: {participationrestrictions:25113}  PERSONAL FACTORS: {Personal factors:25162} are also affecting patient's functional outcome.   REHAB POTENTIAL:  {rehabpotential:25112}  CLINICAL DECISION MAKING: {clinical decision making:25114}  EVALUATION COMPLEXITY: {Evaluation complexity:25115}   GOALS: Goals reviewed with patient? {yes/no:20286}  SHORT TERM GOALS: Target date: ***  Patient will be independent with initial HEP. Baseline: *** Goal status: {GOALSTATUS:25110}  2.  *** Baseline: *** Goal status: {GOALSTATUS:25110}  3.  *** Baseline: *** Goal status: {GOALSTATUS:25110}   LONG TERM GOALS: Target date: ***  Patient will be independent with advanced/ongoing HEP to improve outcomes and carryover.  Baseline: ***  Goal status: {GOALSTATUS:25110}  2.  Patient will report at least 75% improvement in *** knee pain to improve QOL. Baseline: *** Goal status: {GOALSTATUS:25110}  3.  Patient will demonstrate improved *** knee AROM to >/= ***-*** deg to allow for normal gait and stair mechanics. Baseline: *** Goal status: {GOALSTATUS:25110}  4.  Patient will demonstrate improved *** strength to >/= ***/5 for improved stability and ease of mobility. Baseline: *** Goal status: {GOALSTATUS:25110}  5.  Patient will be able to ambulate 600' with LRAD and normal gait pattern without increased pain to access community.  Baseline: *** Goal status: {GOALSTATUS:25110}  6. Patient will be able to ascend/descend stairs with 1 HR and reciprocal step pattern safely to access home and community.  Baseline: *** Goal status: {GOALSTATUS:25110}  7.  Patient will report *** on *** (patient reported outcome measure) to demonstrate improved functional ability. Baseline: *** Goal status: {GOALSTATUS:25110}  8.  Patient will demonstrate at least 19/24 on DGI to decrease risk of falls. Baseline: *** Goal status: {GOALSTATUS:25110}   9.  *** Baseline: *** Goal status: {GOALSTATUS:25110}   PLAN:  PT FREQUENCY: {rehab frequency:25116}  PT DURATION: {rehab duration:25117}  PLANNED INTERVENTIONS: {rehab planned  interventions:25118::"Therapeutic exercises","Therapeutic activity","Neuromuscular re-education","Balance training","Gait training","Patient/Family education","Self Care","Joint mobilization"}  PLAN FOR NEXT SESSION: ***   Marry Guan, PT 09/02/2022, 12:16 PM

## 2022-09-04 ENCOUNTER — Other Ambulatory Visit: Payer: Self-pay

## 2022-09-04 ENCOUNTER — Ambulatory Visit: Payer: Medicare Other | Attending: Orthopedic Surgery

## 2022-09-04 DIAGNOSIS — R293 Abnormal posture: Secondary | ICD-10-CM | POA: Insufficient documentation

## 2022-09-04 DIAGNOSIS — M25562 Pain in left knee: Secondary | ICD-10-CM | POA: Insufficient documentation

## 2022-09-04 DIAGNOSIS — R262 Difficulty in walking, not elsewhere classified: Secondary | ICD-10-CM | POA: Insufficient documentation

## 2022-09-04 DIAGNOSIS — G8929 Other chronic pain: Secondary | ICD-10-CM | POA: Insufficient documentation

## 2022-09-04 NOTE — Therapy (Signed)
OUTPATIENT PHYSICAL THERAPY LOWER EXTREMITY EVALUATION   Patient Name: Tiffany Byrd MRN: [id number] DOB:Aug 16, 1940, 82 y.o., female Today's Date: 09/04/2022   END OF SESSION:  PT End of Session - 09/04/22 1108     Visit Number 1    Date for PT Re-Evaluation 10/30/22    Progress Note Due on Visit 10    PT Start Time 1102    PT Stop Time 1145    PT Time Calculation (min) 43 min    Activity Tolerance Patient tolerated treatment well    Behavior During Therapy WFL for tasks assessed/performed             Past Medical History:  Diagnosis Date   GERD (gastroesophageal reflux disease)    Hyperlipidemia    Stroke (HCC)    Thyroid disease    Past Surgical History:  Procedure Laterality Date   ABDOMINAL HYSTERECTOMY     CHOLECYSTECTOMY     LUMBAR LAMINECTOMY/DECOMPRESSION MICRODISCECTOMY     REPLACEMENT TOTAL KNEE     Patient Active Problem List   Diagnosis Date Noted   Gait difficulty 02/03/2022   Metabolic bone disease 11/05/2021   Benign hypertension with CKD (chronic kidney disease) stage III (HCC) 07/29/2021   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 07/29/2021   Acute stroke due to ischemia (HCC) 07/14/2021   Mixed hyperlipidemia 07/14/2021   Essential hypertension 07/14/2021   GERD without esophagitis 07/14/2021   Facet arthropathy, lumbosacral 07/03/2021   Lumbar radiculopathy 07/03/2021   Chronic kidney disease, stage 3a (HCC) 04/30/2021   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   Closed fracture of left tibial plateau 04/09/2020   History of total right knee replacement 03/05/2020   Primary osteoarthritis of right hip 03/05/2020   Primary osteoarthritis of left knee 03/05/2020   PVD (posterior vitreous detachment), both eyes 08/04/2019   Acute renal insufficiency 06/22/2019   Greater trochanteric bursitis of left hip 10/06/2018   Episodic tension-type headache, not intractable 03/17/2018   Status post  lumbar spine operative procedure for decompression of spinal cord 11/19/2016   Sensorineural hearing loss, bilateral 05/15/2015   Hallux valgus with bunions of right foot 04/04/2015   Hypothyroidism 04/04/2015   Other chondrocalcinosis, unspecified site 10/20/2014   Pseudogout of left knee 07/06/2014   Primary localized osteoarthrosis of ankle and foot 04/11/2008    PCP: Raynelle Jan., MD   REFERRING PROVIDER: Jodi Geralds, MD   REFERRING DIAG: M25.569 (ICD-10-CM) - Knee pain   THERAPY DIAG:  Chronic pain of left knee  Abnormal posture  Difficulty in walking, not elsewhere classified  RATIONALE FOR EVALUATION AND TREATMENT: Rehabilitation  ONSET DATE: chronic, worse last 6 months  NEXT MD VISIT: in one week, series of injections over next 3 weeks   SUBJECTIVE:  SUBJECTIVE STATEMENT: I went to ortho to see about possibly having surgery on my L knee but he wants me to try cartilage injections, a knee brace, and physical therapy. My R knee and hip have been replaced and with my L knee my back is hurting and I feel really unstable  PAIN: Are you having pain? Yes: NPRS scale: 0 to 8/10 Pain location: L medial knee primarily Pain description: stabbing, pulsing , throbbing pain Aggravating factors: sit to stand, walking Relieving factors: rest, ice, heat   PERTINENT HISTORY:  R TKA; R THA, OA - R hip, L knee, ankle and foot; L hip greater trochanteric bursitis; CVA ; lumbar lami/decompression/microdiscectomy;  metabolic bone disease; HTN; hypothyroidism; hearing loss, recently widowed  PRECAUTIONS: None  RED FLAGS: None  WEIGHT BEARING RESTRICTIONS: No  FALLS:  Has patient fallen in last 6 months? No  LIVING ENVIRONMENT: Lives with: lives with their son Lives in:  House/apartment Stairs: No Has following equipment at home: Single point cane, Environmental consultant - 2 wheeled, and Environmental consultant - 4 wheeled  OCCUPATION: retired  PLOF: Independent  PATIENT GOALS: I want to have more independence and quality of life.  My L knee hurts and feels so unstable that I'm really afraid of falling, have to get my son to go with me to grocery store, etc   OBJECTIVE: (objective measures completed at initial evaluation unless otherwise dated)  DIAGNOSTIC FINDINGS:  05/15/22 - L knee x-ray:  IMPRESSION: No acute finding. No change in advanced tricompartmental osteoarthritis. Small joint effusion.  PATIENT SURVEYS:  KOOS 12 :33/100  COGNITION: Overall cognitive status: Within functional limits for tasks assessed    SENSATION: WFL    MUSCLE LENGTH: Hamstrings: Right wfl deg; Left wfl deg Maisie Fus test: Right wfl deg; Left wfl deg Hamstrings: wfl ITB: wfl Piriformis: wfl Hip flexors: wfl Quads: wfl Heelcord: wfl  POSTURE:  Hyperextended L knee by at least 15 degrees, valgum as well, mild iliac crest height difference with R iliac crest elevated 1 cm  PALPATION: Tender along medial L knee jt line, noted excessive jt play L knee with testing L hip rotation and abduction  LOWER EXTREMITY ROM:  Active ROM Right eval Left   eval  Hip flexion    Hip extension    Hip abduction    Hip adduction    Hip internal rotation    Hip external rotation    Knee flexion  126  Knee extension  114 degrees hyperext  Ankle dorsiflexion    Ankle plantarflexion    Ankle inversion    Ankle eversion     LOWER EXTREMITY MMT:  MMT Right eval Left   eval  Hip flexion  4/5  Hip extension    Hip abduction  4/5  Hip adduction    Hip internal rotation    Hip external rotation    Knee flexion    Knee extension  4-/5  Ankle dorsiflexion    Ankle plantarflexion    Ankle inversion    Ankle eversion     (Blank rows = not tested)  FUNCTIONAL TESTS:  10 M walk test 12 sec, 0.8  m/sec  GAIT: Distance walked: up to 100' in department Assistive device utilized: Environmental consultant - 4 wheeled Level of assistance: Modified independence Gait pattern: step through pattern and genu recurvatum- Left Comments: marked instability L knee with medial L tibial movement on femur and recurvatum   TODAY'S TREATMENT:  09/04/22:  PT evaluation:  instructed in HEP as described below  PATIENT EDUCATION:  Education details: PT eval findings, anticipated POC, and initial HEP  Person educated: Patient Education method: Explanation, Demonstration, Tactile cues, Verbal cues, and Handouts Education comprehension: verbalized understanding, returned demonstration, and verbal cues required  HOME EXERCISE PROGRAM: Instructed in the following: Access Code: [email or web address] URL: [email or web address] Date: 09/04/2022 Prepared by: Linton Rump Jakeira Seeman  Exercises - Supine Bridge  - 1 x daily - 7 x weekly - 3 sets - 10 reps - Seated Ankle Plantarflexion with Resistance  - 1 x daily - 7 x weekly - 3 sets - 10 reps - Seated Hip Abduction with Resistance  - 1 x daily - 7 x weekly - 3 sets - 10 reps - Seated Long Arc Quad  - 1 x daily - 7 x weekly - 3 sets - 10 reps   ASSESSMENT:  CLINICAL IMPRESSION: Carolin Quang is a 82 y.o. female who was seen today for physical therapy evaluation and treatment for L knee pain/ osteoarthritis. She presents with instability/ alignment changes in her L knee with marked recurvatum, with increased jt play which affects her safety and fall risk.  Also diffuse weakness musculature surrounding L knee and hip.  Pain primarily medial L knee.  She is to obtain custom fitted hinged brace L knee by the end of the month which hopefully will assist with her L knee stability.  She wants to attend PT once a week for ex instruction for her to perform at home.  Therefore instructed in exercises to enhance her stability L knee ant and post today.  Will benefit from brief duration of PT to  establish long term home ex program to provide stability and jt protection L knee.   OBJECTIVE IMPAIRMENTS: Abnormal gait, decreased activity tolerance, decreased balance, decreased endurance, difficulty walking, decreased strength, and pain.   ACTIVITY LIMITATIONS: carrying, lifting, squatting, stairs, transfers, bed mobility, bathing, dressing, and locomotion level  PARTICIPATION LIMITATIONS: meal prep, cleaning, laundry, shopping, community activity, and church  PERSONAL FACTORS: Age, Fitness, Past/current experiences, Time since onset of injury/illness/exacerbation, and 1-2 comorbidities: h/o R THA, and R TKA, HTN  are also affecting patient's functional outcome.   REHAB POTENTIAL: Good  CLINICAL DECISION MAKING: Stable/uncomplicated  EVALUATION COMPLEXITY: Low   GOALS: Goals reviewed with patient? Yes  SHORT TERM GOALS: Target date: 2 weeks   Patient will be independent with initial HEP. Baseline: initiated at eval Goal status: IN PROGRESS   LONG TERM GOALS: Target date: 8 weeks, 10/30/22  Patient will be independent with advanced/ongoing HEP to improve outcomes and carryover.  Baseline :  established at eval Goal status: INITIAL  2.  Patient will report at least 75% improvement in L knee pain to improve QOL. Baseline: up to 8/10 Goal status: INITIAL  3.  Patient will demonstrate improved improved KOOS 12 to 44/100 Baseline: 33/100 Goal status: INITIAL  4.  Patient will demonstrate improved L quads strength to >/= 4+//5 for improved stability and ease of mobility. Baseline: 4- Goal status: INITIAL  5.  Patient will be able to ambulate 600' with LRAD  without increased pain to access community.  Baseline: 150' with rollator and pain L knee Goal status: INITIAL  6. Patient will be able to ascend/descend one curb/step  with rollator for safety for community gait such as going into hair salon. Baseline: needs + 1 assist Goal status: INITIAL  7.  Patient will  demonstrate at least 19/24 on DGI to decrease risk of falls. Baseline: TBD Goal status: INITIAL    PLAN:  PT FREQUENCY: 1x/week  PT DURATION: 8 weeks  PLANNED INTERVENTIONS: Therapeutic exercises, Therapeutic activity, Neuromuscular re-education, Balance training, Gait training, Patient/Family education, Self Care, and Joint mobilization  PLAN FOR NEXT SESSION: how did exercises go?  Would advance to standing L hip strength with straight knee to avoid stressing jt.  After next session may want to wait until receives knee brace prior to other PT appts to address curbs, balance, FGA, etc   Adelfa Lozito L Timathy Newberry, PT, DPT [facility] 09/04/2022, 12:25 PM

## 2022-09-10 ENCOUNTER — Ambulatory Visit: Payer: Medicare Other

## 2022-09-10 DIAGNOSIS — R293 Abnormal posture: Secondary | ICD-10-CM

## 2022-09-10 DIAGNOSIS — G8929 Other chronic pain: Secondary | ICD-10-CM

## 2022-09-10 DIAGNOSIS — M25562 Pain in left knee: Secondary | ICD-10-CM | POA: Diagnosis not present

## 2022-09-10 DIAGNOSIS — R262 Difficulty in walking, not elsewhere classified: Secondary | ICD-10-CM

## 2022-09-10 NOTE — Therapy (Signed)
OUTPATIENT PHYSICAL THERAPY TREATMENT   Patient Name: Tiffany Byrd MRN: [id number] DOB:08/22/1940, 82 y.o., female Today's Date: 09/10/2022   END OF SESSION:  PT End of Session - 09/10/22 1112     Visit Number 2    Date for PT Re-Evaluation 10/30/22    Progress Note Due on Visit 10    PT Start Time 1105    PT Stop Time 1148    PT Time Calculation (min) 43 min    Activity Tolerance Patient tolerated treatment well    Behavior During Therapy WFL for tasks assessed/performed              Past Medical History:  Diagnosis Date   GERD (gastroesophageal reflux disease)    Hyperlipidemia    Stroke (HCC)    Thyroid disease    Past Surgical History:  Procedure Laterality Date   ABDOMINAL HYSTERECTOMY     CHOLECYSTECTOMY     LUMBAR LAMINECTOMY/DECOMPRESSION MICRODISCECTOMY     REPLACEMENT TOTAL KNEE     Patient Active Problem List   Diagnosis Date Noted   Gait difficulty 02/03/2022   Metabolic bone disease 11/05/2021   Benign hypertension with CKD (chronic kidney disease) stage III (HCC) 07/29/2021   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 07/29/2021   Acute stroke due to ischemia (HCC) 07/14/2021   Mixed hyperlipidemia 07/14/2021   Essential hypertension 07/14/2021   GERD without esophagitis 07/14/2021   Facet arthropathy, lumbosacral 07/03/2021   Lumbar radiculopathy 07/03/2021   Chronic kidney disease, stage 3a (HCC) 04/30/2021   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   Closed fracture of left tibial plateau 04/09/2020   History of total right knee replacement 03/05/2020   Primary osteoarthritis of right hip 03/05/2020   Primary osteoarthritis of left knee 03/05/2020   PVD (posterior vitreous detachment), both eyes 08/04/2019   Acute renal insufficiency 06/22/2019   Greater trochanteric bursitis of left hip 10/06/2018   Episodic tension-type headache, not intractable 03/17/2018   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 11/19/2016   Sensorineural hearing loss, bilateral 05/15/2015   Hallux valgus with bunions of right foot 04/04/2015   Hypothyroidism 04/04/2015   Other chondrocalcinosis, unspecified site 10/20/2014   Pseudogout of left knee 07/06/2014   Primary localized osteoarthrosis of ankle and foot 04/11/2008    PCP: Raynelle Jan., MD   REFERRING PROVIDER: Jodi Geralds, MD   REFERRING DIAG: M25.569 (ICD-10-CM) - Knee pain   THERAPY DIAG:  Chronic pain of left knee  Abnormal posture  Difficulty in walking, not elsewhere classified  RATIONALE FOR EVALUATION AND TREATMENT: Rehabilitation  ONSET DATE: chronic, worse last 6 months  NEXT MD VISIT: in one week, series of injections over next 3 weeks   SUBJECTIVE:  SUBJECTIVE STATEMENT: Pt reports she will be getting two more injections for L knee. Most pain is on the inside when it does hurt but no pain right now.  PAIN: Are you having pain? Yes: NPRS scale: 0 to 8/10 Pain location: L medial knee primarily Pain description: stabbing, pulsing , throbbing pain Aggravating factors: sit to stand, walking Relieving factors: rest, ice, heat   PERTINENT HISTORY:  R TKA; R THA, OA - R hip, L knee, ankle and foot; L hip greater trochanteric bursitis; CVA ; lumbar lami/decompression/microdiscectomy;  metabolic bone disease; HTN; hypothyroidism; hearing loss, recently widowed  PRECAUTIONS: None  RED FLAGS: None  WEIGHT BEARING RESTRICTIONS: No  FALLS:  Has patient fallen in last 6 months? No  LIVING ENVIRONMENT: Lives with: lives with their son Lives in: House/apartment Stairs: No Has following equipment at home: Single point cane, Environmental consultant - 2 wheeled, and Environmental consultant - 4  wheeled  OCCUPATION: retired  PLOF: Independent  PATIENT GOALS: I want to have more independence and quality of life.  My L knee hurts and feels so unstable that I'm really afraid of falling, have to get my son to go with me to grocery store, etc   OBJECTIVE: (objective measures completed at initial evaluation unless otherwise dated)  DIAGNOSTIC FINDINGS:  05/15/22 - L knee x-ray:  IMPRESSION: No acute finding. No change in advanced tricompartmental osteoarthritis. Small joint effusion.  PATIENT SURVEYS:  KOOS 12 :33/100  COGNITION: Overall cognitive status: Within functional limits for tasks assessed    SENSATION: WFL    MUSCLE LENGTH: Hamstrings: Right wfl deg; Left wfl deg Maisie Fus test: Right wfl deg; Left wfl deg Hamstrings: wfl ITB: wfl Piriformis: wfl Hip flexors: wfl Quads: wfl Heelcord: wfl  POSTURE:  Hyperextended L knee by at least 15 degrees, valgum as well, mild iliac crest height difference with R iliac crest elevated 1 cm  PALPATION: Tender along medial L knee jt line, noted excessive jt play L knee with testing L hip rotation and abduction  LOWER EXTREMITY ROM:  Active ROM Right eval Left   eval  Hip flexion    Hip extension    Hip abduction    Hip adduction    Hip internal rotation    Hip external rotation    Knee flexion  126  Knee extension  114 degrees hyperext  Ankle dorsiflexion    Ankle plantarflexion    Ankle inversion    Ankle eversion     LOWER EXTREMITY MMT:  MMT Right eval Left   eval  Hip flexion  4/5  Hip extension    Hip abduction  4/5  Hip adduction    Hip internal rotation    Hip external rotation    Knee flexion    Knee extension  4-/5  Ankle dorsiflexion    Ankle plantarflexion    Ankle inversion    Ankle eversion     (Blank rows = not tested)  FUNCTIONAL TESTS:  10 M walk test 12 sec, 0.8 m/sec  GAIT: Distance walked: up to 100' in department Assistive device utilized: Environmental consultant - 4 wheeled Level of  assistance: Modified independence Gait pattern: step through pattern and genu recurvatum- Left Comments: marked instability L knee with medial L tibial movement on femur and recurvatum   TODAY'S TREATMENT:  09/10/22 Recumbent Bike L1x45min Supine bridge 3x10 Modified lunges x 10  Standing hip abduction x 10 RTB at thigh Standing hip extension x 10 RTB at thigh L modified SLS working on standing w/o genu  recurvatum  Gentle STM to medial jt line and hip Adductors  09/04/22:  PT evaluation:  instructed in HEP as described below   PATIENT EDUCATION:  Education details: PT eval findings, anticipated POC, and initial HEP  Person educated: Patient Education method: Explanation, Demonstration, Tactile cues, Verbal cues, and Handouts Education comprehension: verbalized understanding, returned demonstration, and verbal cues required  HOME EXERCISE PROGRAM: Instructed in the following: Access Code: [email or web address] URL: [email or web address] Date: 09/04/2022 Prepared by: Linton Rump Speaks  Exercises - Supine Bridge  - 1 x daily - 7 x weekly - 3 sets - 10 reps - Seated Ankle Plantarflexion with Resistance  - 1 x daily - 7 x weekly - 3 sets - 10 reps - Seated Hip Abduction with Resistance  - 1 x daily - 7 x weekly - 3 sets - 10 reps - Seated Long Arc Quad  - 1 x daily - 7 x weekly - 3 sets - 10 reps   ASSESSMENT:  CLINICAL IMPRESSION: Pt arrived with no reported L knee pain. Exercises were focused on strength of hips and knees. She needs more emphasize on facilitating the L quads in WB to contract as she hyperextends her knee with every step. She does show some TTP and knots in the VMO and hip adductors.    OBJECTIVE IMPAIRMENTS: Abnormal gait, decreased activity tolerance, decreased balance, decreased endurance, difficulty walking, decreased strength, and pain.   ACTIVITY LIMITATIONS: carrying, lifting, squatting, stairs, transfers, bed mobility, bathing, dressing, and locomotion  level  PARTICIPATION LIMITATIONS: meal prep, cleaning, laundry, shopping, community activity, and church  PERSONAL FACTORS: Age, Fitness, Past/current experiences, Time since onset of injury/illness/exacerbation, and 1-2 comorbidities: h/o R THA, and R TKA, HTN  are also affecting patient's functional outcome.   REHAB POTENTIAL: Good  CLINICAL DECISION MAKING: Stable/uncomplicated  EVALUATION COMPLEXITY: Low   GOALS: Goals reviewed with patient? Yes  SHORT TERM GOALS: Target date: 2 weeks   Patient will be independent with initial HEP. Baseline: initiated at eval Goal status: IN PROGRESS   LONG TERM GOALS: Target date: 8 weeks, 10/30/22  Patient will be independent with advanced/ongoing HEP to improve outcomes and carryover.  Baseline :  established at eval Goal status: INITIAL  2.  Patient will report at least 75% improvement in L knee pain to improve QOL. Baseline: up to 8/10 Goal status: INITIAL  3.  Patient will demonstrate improved improved KOOS 12 to 44/100 Baseline: 33/100 Goal status: INITIAL  4.  Patient will demonstrate improved L quads strength to >/= 4+//5 for improved stability and ease of mobility. Baseline: 4- Goal status: INITIAL  5.  Patient will be able to ambulate 600' with LRAD  without increased pain to access community.  Baseline: 150' with rollator and pain L knee Goal status: INITIAL  6. Patient will be able to ascend/descend one curb/step  with rollator for safety for community gait such as going into hair salon. Baseline: needs + 1 assist Goal status: INITIAL  7.  Patient will demonstrate at least 19/24 on DGI to decrease risk of falls. Baseline: TBD Goal status: INITIAL    PLAN:  PT FREQUENCY: 1x/week  PT DURATION: 8 weeks  PLANNED INTERVENTIONS: Therapeutic exercises, Therapeutic activity, Neuromuscular re-education, Balance training, Gait training, Patient/Family education, Self Care, and Joint mobilization  PLAN FOR NEXT  SESSION: how did exercises go?  Would advance to standing L hip strength with straight knee to avoid stressing jt.  After next session may want to wait until receives knee brace prior to  other PT appts to address curbs, balance, FGA, etc   Darleene Cleaver, PTA 09/10/2022, 12:05 PM

## 2022-09-17 NOTE — Progress Notes (Signed)
Carelink Summary Report / Loop Recorder 

## 2022-09-19 ENCOUNTER — Encounter: Payer: Medicare Other | Admitting: Physical Therapy

## 2022-10-01 ENCOUNTER — Encounter: Payer: Self-pay | Admitting: Physical Therapy

## 2022-10-01 ENCOUNTER — Ambulatory Visit: Payer: Medicare Other | Attending: Orthopedic Surgery | Admitting: Physical Therapy

## 2022-10-01 DIAGNOSIS — R262 Difficulty in walking, not elsewhere classified: Secondary | ICD-10-CM | POA: Diagnosis present

## 2022-10-01 DIAGNOSIS — M25562 Pain in left knee: Secondary | ICD-10-CM | POA: Insufficient documentation

## 2022-10-01 DIAGNOSIS — R293 Abnormal posture: Secondary | ICD-10-CM | POA: Insufficient documentation

## 2022-10-01 DIAGNOSIS — G8929 Other chronic pain: Secondary | ICD-10-CM | POA: Diagnosis present

## 2022-10-01 NOTE — Therapy (Signed)
OUTPATIENT PHYSICAL THERAPY TREATMENT   Patient Name: Tiffany Byrd MRN: [id number] DOB:1940/12/26, 82 y.o., female Today's Date: 10/01/2022   END OF SESSION:  PT End of Session - 10/01/22 1103     Visit Number 3    Date for PT Re-Evaluation 10/30/22    Progress Note Due on Visit 10    PT Start Time 1103    Activity Tolerance Patient tolerated treatment well    Behavior During Therapy [facility] for tasks assessed/performed              Past Medical History:  Diagnosis Date   GERD (gastroesophageal reflux disease)    Hyperlipidemia    Stroke (HCC)    Thyroid disease    Past Surgical History:  Procedure Laterality Date   ABDOMINAL HYSTERECTOMY     CHOLECYSTECTOMY     LUMBAR LAMINECTOMY/DECOMPRESSION MICRODISCECTOMY     REPLACEMENT TOTAL KNEE     Patient Active Problem List   Diagnosis Date Noted   Gait difficulty 02/03/2022   Metabolic bone disease 11/05/2021   Benign hypertension with CKD (chronic kidney disease) stage III (HCC) 07/29/2021   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 07/29/2021   Acute stroke due to ischemia (HCC) 07/14/2021   Mixed hyperlipidemia 07/14/2021   Essential hypertension 07/14/2021   GERD without esophagitis 07/14/2021   Facet arthropathy, lumbosacral 07/03/2021   Lumbar radiculopathy 07/03/2021   Chronic kidney disease, stage 3a (HCC) 04/30/2021   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   Closed fracture of left tibial plateau 04/09/2020   History of total right knee replacement 03/05/2020   Primary osteoarthritis of right hip 03/05/2020   Primary osteoarthritis of left knee 03/05/2020   PVD (posterior vitreous detachment), both eyes 08/04/2019   Acute renal insufficiency 06/22/2019   Greater trochanteric bursitis of left hip 10/06/2018   Episodic tension-type headache, not intractable 03/17/2018   Status post lumbar spine operative procedure for decompression of spinal cord  11/19/2016   Sensorineural hearing loss, bilateral 05/15/2015   Hallux valgus with bunions of right foot 04/04/2015   Hypothyroidism 04/04/2015   Other chondrocalcinosis, unspecified site 10/20/2014   Pseudogout of left knee 07/06/2014   Primary localized osteoarthrosis of ankle and foot 04/11/2008    PCP: Raynelle Jan., MD   REFERRING PROVIDER: Jodi Geralds, MD   REFERRING DIAG: M25.569 (ICD-10-CM) - Knee pain   THERAPY DIAG:  Chronic pain of left knee  Abnormal posture  Difficulty in walking, not elsewhere classified  RATIONALE FOR EVALUATION AND TREATMENT: Rehabilitation  ONSET DATE: chronic, worse last 6 months  NEXT MD VISIT: in one week, series of injections over next 3 weeks   SUBJECTIVE:  SUBJECTIVE STATEMENT: Tiffany Byrd reports she finished last of 3 injections this week and it has helped pain.  She got the knee brace but the velcro pops open and it falls down, so she is going to return it and try to find more substantial one.    PAIN: Are you having pain? Yes: NPRS scale: 6/10 Pain location: L medial knee primarily Pain description: stabbing, pulsing , throbbing pain Aggravating factors: sit to stand, walking Relieving factors: rest, ice, heat   PERTINENT HISTORY:  R TKA; R THA, OA - R hip, L knee, ankle and foot; L hip greater trochanteric bursitis; CVA ; lumbar lami/decompression/microdiscectomy;  metabolic bone disease; HTN; hypothyroidism; hearing loss, recently widowed  PRECAUTIONS: None  RED FLAGS: None  WEIGHT BEARING RESTRICTIONS: No  FALLS:  Has patient fallen in last 6 months? No  LIVING ENVIRONMENT: Lives with: lives with their son Lives in: House/apartment Stairs: No Has following equipment at home: Single point cane, Environmental consultant - 2 wheeled,  and Environmental consultant - 4 wheeled  OCCUPATION: retired  PLOF: Independent  PATIENT GOALS: I want to have more independence and quality of life.  My L knee hurts and feels so unstable that I'm really afraid of falling, have to get my son to go with me to grocery store, etc   OBJECTIVE: (objective measures completed at initial evaluation unless otherwise dated)  DIAGNOSTIC FINDINGS:  05/15/22 - L knee x-ray:  IMPRESSION: No acute finding. No change in advanced tricompartmental osteoarthritis. Small joint effusion.  PATIENT SURVEYS:  KOOS 12 :33/100  COGNITION: Overall cognitive status: Within functional limits for tasks assessed    SENSATION: WFL    MUSCLE LENGTH: Hamstrings: Right wfl deg; Left wfl deg Tiffany Byrd test: Right wfl deg; Left wfl deg Hamstrings: wfl ITB: wfl Piriformis: wfl Hip flexors: wfl Quads: wfl Heelcord: wfl  POSTURE:  Hyperextended L knee by at least 15 degrees, valgum as well, mild iliac crest height difference with R iliac crest elevated 1 cm  PALPATION: Tender along medial L knee jt line, noted excessive jt play L knee with testing L hip rotation and abduction  LOWER EXTREMITY ROM:  Active ROM Right eval Left   eval  Hip flexion    Hip extension    Hip abduction    Hip adduction    Hip internal rotation    Hip external rotation    Knee flexion  126  Knee extension  114 degrees hyperext  Ankle dorsiflexion    Ankle plantarflexion    Ankle inversion    Ankle eversion     LOWER EXTREMITY MMT:  MMT Right eval Left   eval  Hip flexion  4/5  Hip extension    Hip abduction  4/5  Hip adduction    Hip internal rotation    Hip external rotation    Knee flexion    Knee extension  4-/5  Ankle dorsiflexion    Ankle plantarflexion    Ankle inversion    Ankle eversion     (Blank rows = not tested)  FUNCTIONAL TESTS:  10 M walk test 12 sec, 0.8 m/sec  GAIT: Distance walked: up to 100' in department Assistive device utilized: Environmental consultant - 4  wheeled Level of assistance: Modified independence Gait pattern: step through pattern and genu recurvatum- Left Comments: marked instability L knee with medial L tibial movement on femur and recurvatum   TODAY'S TREATMENT:  10/01/22 Therapeutic Exercise: to improve strength and mobility.  Demo, verbal and tactile cues throughout for technique. Bike  L1 x 6 min  Supine SLR 3 x 5 each side Bridges 2 x 10  Prone knee bends 2 x 10 cues for eccentric control Prone hip extension 2 x 10 bil - tried with bent knee but unable due to HS weakness on L Minisquats x 10 with bil UE support on chair arms.  Review of seated exercises  09/10/22 Recumbent Bike L1x52min Supine bridge 3x10 Modified lunges x 10  Standing hip abduction x 10 RTB at thigh Standing hip extension x 10 RTB at thigh L modified SLS working on standing w/o genu recurvatum  Gentle STM to medial jt line and hip Adductors  09/04/22:  PT evaluation:  instructed in HEP as described below   PATIENT EDUCATION:  Education details: HEP review and HEP update  Person educated: Patient Education method: Explanation, Demonstration, Tactile cues, Verbal cues, and Handouts Education comprehension: verbalized understanding, returned demonstration, and verbal cues required  HOME EXERCISE PROGRAM: Instructed in the following: Access Code: [email or web address] URL: [email or web address] Date: 10/01/2022 Prepared by: Harrie Foreman  Exercises - Supine Bridge  - 1 x daily - 7 x weekly - 3 sets - 10 reps - Seated Ankle Plantarflexion with Resistance  - 1 x daily - 7 x weekly - 3 sets - 10 reps - Seated Hip Abduction with Resistance  - 1 x daily - 7 x weekly - 3 sets - 10 reps - Seated Long Arc Quad  - 1 x daily - 7 x weekly - 3 sets - 10 reps - Seated Knee Extension with Resistance  - 1 x daily - 7 x weekly - 3 sets - 10 reps - Seated Hamstring Curls with Resistance  - 1 x daily - 7 x weekly - 3 sets - 10 reps - Supine Active Straight  Leg Raise  - 1 x daily - 7 x weekly - 2-3 sets - 5-10 reps - Prone Knee Flexion  - 1 x daily - 7 x weekly - 2-3 sets - 10 reps - Prone Hip Extension  - 1 x daily - 7 x weekly - 2-3 sets - 10 reps - Mini Squat with Chair  - 1 x daily - 7 x weekly - 3 sets - 10 reps   ASSESSMENT:  CLINICAL IMPRESSION: Tiffany Byrd reports difficulty with knee brace.  Continues to hyperextend in walking.  Also having difficulty with copay so only planning on a few more visits, so focused on developing HEP today for knee and hip strengthening.  Reviewed current HEP, then continued mat level exercises for hip and knee that could be performed safely.  These exercises were challenging, to start with sets of 5.  Tiffany Byrd continues to demonstrate potential for improvement and would benefit from continued skilled therapy to address impairments.    OBJECTIVE IMPAIRMENTS: Abnormal gait, decreased activity tolerance, decreased balance, decreased endurance, difficulty walking, decreased strength, and pain.   ACTIVITY LIMITATIONS: carrying, lifting, squatting, stairs, transfers, bed mobility, bathing, dressing, and locomotion level  PARTICIPATION LIMITATIONS: meal prep, cleaning, laundry, shopping, community activity, and church  PERSONAL FACTORS: Age, Fitness, Past/current experiences, Time since onset of injury/illness/exacerbation, and 1-2 comorbidities: h/o R THA, and R TKA, HTN  are also affecting patient's functional outcome.   REHAB POTENTIAL: Good  CLINICAL DECISION MAKING: Stable/uncomplicated  EVALUATION COMPLEXITY: Low   GOALS: Goals reviewed with patient? Yes  SHORT TERM GOALS: Target date: 2 weeks   Patient will be independent with initial HEP. Baseline: initiated at eval Goal status: MET 10/01/2022   LONG  TERM GOALS: Target date: 8 weeks, 10/30/22  Patient will be independent with advanced/ongoing HEP to improve outcomes and carryover.  Baseline :  established at eval Goal status: IN  PROGRESS  2.  Patient will report at least 75% improvement in L knee pain to improve QOL. Baseline: up to 8/10 Goal status: IN PROGRESS  3.  Patient will demonstrate improved improved KOOS 12 to 44/100 Baseline: 33/100 Goal status: IN PROGRESS  4.  Patient will demonstrate improved L quads strength to >/= 4+//5 for improved stability and ease of mobility. Baseline: 4- Goal status: IN PROGRESS  5.  Patient will be able to ambulate 600' with LRAD  without increased pain to access community.  Baseline: 150' with rollator and pain L knee Goal status: IN PROGRESS  6. Patient will be able to ascend/descend one curb/step  with rollator for safety for community gait such as going into hair salon. Baseline: needs + 1 assist Goal status: IN PROGRESS  7.  Patient will demonstrate at least 19/24 on DGI to decrease risk of falls. Baseline: TBD Goal status: IN PROGRESS    PLAN:  PT FREQUENCY: 1x/week  PT DURATION: 8 weeks  PLANNED INTERVENTIONS: Therapeutic exercises, Therapeutic activity, Neuromuscular re-education, Balance training, Gait training, Patient/Family education, Self Care, and Joint mobilization  PLAN FOR NEXT SESSION: continue hip/knee strengthening, hyperextends L knee and severe genu valgus.   Need DGI (use 4WRW), go over curb safety.   Jena Gauss, PT, DPT  10/01/2022, 11:04 AM

## 2022-10-02 ENCOUNTER — Ambulatory Visit: Payer: Medicare Other

## 2022-10-06 ENCOUNTER — Ambulatory Visit (INDEPENDENT_AMBULATORY_CARE_PROVIDER_SITE_OTHER): Payer: Medicare Other

## 2022-10-06 DIAGNOSIS — I639 Cerebral infarction, unspecified: Secondary | ICD-10-CM

## 2022-10-10 ENCOUNTER — Ambulatory Visit: Payer: Medicare Other

## 2022-10-10 DIAGNOSIS — G8929 Other chronic pain: Secondary | ICD-10-CM

## 2022-10-10 DIAGNOSIS — R293 Abnormal posture: Secondary | ICD-10-CM

## 2022-10-10 DIAGNOSIS — R262 Difficulty in walking, not elsewhere classified: Secondary | ICD-10-CM

## 2022-10-10 DIAGNOSIS — M25562 Pain in left knee: Secondary | ICD-10-CM | POA: Diagnosis not present

## 2022-10-10 NOTE — Therapy (Signed)
OUTPATIENT PHYSICAL THERAPY TREATMENT   Patient Name: Tiffany Byrd MRN: [id number] DOB:Jul 22, 1940, 82 y.o., female Today's Date: 10/10/2022   END OF SESSION:  PT End of Session - 10/10/22 1203     Visit Number 4    Date for PT Re-Evaluation 10/30/22    Progress Note Due on Visit 10    PT Start Time 1106    PT Stop Time 1148    PT Time Calculation (min) 42 min    Activity Tolerance Patient tolerated treatment well    Behavior During Therapy WFL for tasks assessed/performed               Past Medical History:  Diagnosis Date   GERD (gastroesophageal reflux disease)    Hyperlipidemia    Stroke (HCC)    Thyroid disease    Past Surgical History:  Procedure Laterality Date   ABDOMINAL HYSTERECTOMY     CHOLECYSTECTOMY     LUMBAR LAMINECTOMY/DECOMPRESSION MICRODISCECTOMY     REPLACEMENT TOTAL KNEE     Patient Active Problem List   Diagnosis Date Noted   Gait difficulty 02/03/2022   Metabolic bone disease 11/05/2021   Benign hypertension with CKD (chronic kidney disease) stage III (HCC) 07/29/2021   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 07/29/2021   Acute stroke due to ischemia (HCC) 07/14/2021   Mixed hyperlipidemia 07/14/2021   Essential hypertension 07/14/2021   GERD without esophagitis 07/14/2021   Facet arthropathy, lumbosacral 07/03/2021   Lumbar radiculopathy 07/03/2021   Chronic kidney disease, stage 3a (HCC) 04/30/2021   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   Closed fracture of left tibial plateau 04/09/2020   History of total right knee replacement 03/05/2020   Primary osteoarthritis of right hip 03/05/2020   Primary osteoarthritis of left knee 03/05/2020   PVD (posterior vitreous detachment), both eyes 08/04/2019   Acute renal insufficiency 06/22/2019   Greater trochanteric bursitis of left hip 10/06/2018   Episodic tension-type headache, not intractable 03/17/2018   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 11/19/2016   Sensorineural hearing loss, bilateral 05/15/2015   Hallux valgus with bunions of right foot 04/04/2015   Hypothyroidism 04/04/2015   Other chondrocalcinosis, unspecified site 10/20/2014   Pseudogout of left knee 07/06/2014   Primary localized osteoarthrosis of ankle and foot 04/11/2008    PCP: Raynelle Jan., MD   REFERRING PROVIDER: Jodi Geralds, MD   REFERRING DIAG: M25.569 (ICD-10-CM) - Knee pain   THERAPY DIAG:  Chronic pain of left knee  Abnormal posture  Difficulty in walking, not elsewhere classified  RATIONALE FOR EVALUATION AND TREATMENT: Rehabilitation  ONSET DATE: chronic, worse last 6 months  NEXT MD VISIT: in one week, series of injections over next 3 weeks   SUBJECTIVE:  SUBJECTIVE STATEMENT: Yodit Smale reports she finished last of 3 injections this week and it has helped pain.  She got the knee brace but the velcro pops open and it falls down, so she is going to return it and try to find more substantial one.    PAIN: Are you having pain? Yes: NPRS scale: 6/10 Pain location: L medial knee primarily Pain description: stabbing, pulsing , throbbing pain Aggravating factors: sit to stand, walking Relieving factors: rest, ice, heat   PERTINENT HISTORY:  R TKA; R THA, OA - R hip, L knee, ankle and foot; L hip greater trochanteric bursitis; CVA ; lumbar lami/decompression/microdiscectomy;  metabolic bone disease; HTN; hypothyroidism; hearing loss, recently widowed  PRECAUTIONS: None  RED FLAGS: None  WEIGHT BEARING RESTRICTIONS: No  FALLS:  Has patient fallen in last 6 months? No  LIVING ENVIRONMENT: Lives with: lives with their son Lives in: House/apartment Stairs: No Has following  equipment at home: Single point cane, Environmental consultant - 2 wheeled, and Environmental consultant - 4 wheeled  OCCUPATION: retired  PLOF: Independent  PATIENT GOALS: I want to have more independence and quality of life.  My L knee hurts and feels so unstable that I'm really afraid of falling, have to get my son to go with me to grocery store, etc   OBJECTIVE: (objective measures completed at initial evaluation unless otherwise dated)  DIAGNOSTIC FINDINGS:  05/15/22 - L knee x-ray:  IMPRESSION: No acute finding. No change in advanced tricompartmental osteoarthritis. Small joint effusion.  PATIENT SURVEYS:  KOOS 12 :33/100  COGNITION: Overall cognitive status: Within functional limits for tasks assessed    SENSATION: WFL    MUSCLE LENGTH: Hamstrings: Right wfl deg; Left wfl deg Maisie Fus test: Right wfl deg; Left wfl deg Hamstrings: wfl ITB: wfl Piriformis: wfl Hip flexors: wfl Quads: wfl Heelcord: wfl  POSTURE:  Hyperextended L knee by at least 15 degrees, valgum as well, mild iliac crest height difference with R iliac crest elevated 1 cm  PALPATION: Tender along medial L knee jt line, noted excessive jt play L knee with testing L hip rotation and abduction  LOWER EXTREMITY ROM:  Active ROM Right eval Left   eval  Hip flexion    Hip extension    Hip abduction    Hip adduction    Hip internal rotation    Hip external rotation    Knee flexion  126  Knee extension  114 degrees hyperext  Ankle dorsiflexion    Ankle plantarflexion    Ankle inversion    Ankle eversion     LOWER EXTREMITY MMT:  MMT Right eval Left   eval  Hip flexion  4/5  Hip extension    Hip abduction  4/5  Hip adduction    Hip internal rotation    Hip external rotation    Knee flexion    Knee extension  4-/5  Ankle dorsiflexion    Ankle plantarflexion    Ankle inversion    Ankle eversion     (Blank rows = not tested)  FUNCTIONAL TESTS:  10 M walk test 12 sec, 0.8 m/sec  GAIT: Distance walked: up to 100' in  department Assistive device utilized: Environmental consultant - 4 wheeled Level of assistance: Modified independence Gait pattern: step through pattern and genu recurvatum- Left Comments: marked instability L knee with medial L tibial movement on femur and recurvatum   TODAY'S TREATMENT:  10/10/22 Therapeutic Exercise: to improve strength and mobility.  Demo, verbal and tactile cues throughout for technique. Bike  L1x55min DGI :17/24 S/L clamshell 2x10 R side SLR 2x10 bil- cues to avoid genu recurvatum with LLE Bridge with staggered feet (LLE behind for more activation) 2x10  Manual Therapy: STM to L ITB, lateral quads in R S/L   10/01/22 Therapeutic Exercise: to improve strength and mobility.  Demo, verbal and tactile cues throughout for technique. Bike L1 x 6 min  Supine SLR 3 x 5 each side Bridges 2 x 10  Prone knee bends 2 x 10 cues for eccentric control Prone hip extension 2 x 10 bil - tried with bent knee but unable due to HS weakness on L Minisquats x 10 with bil UE support on chair arms.  Review of seated exercises  09/10/22 Recumbent Bike L1x61min Supine bridge 3x10 Modified lunges x 10  Standing hip abduction x 10 RTB at thigh Standing hip extension x 10 RTB at thigh L modified SLS working on standing w/o genu recurvatum  Gentle STM to medial jt line and hip Adductors  09/04/22:  PT evaluation:  instructed in HEP as described below   PATIENT EDUCATION:  Education details: HEP review and HEP update - added clamshells Person educated: Patient Education method: Explanation, Demonstration, Tactile cues, Verbal cues, and Handouts Education comprehension: verbalized understanding, returned demonstration, and verbal cues required  HOME EXERCISE PROGRAM: Instructed in the following: Access Code: [email or web address] URL: [email or web address] Date: 10/01/2022 Prepared by: Harrie Foreman  Exercises - Supine Bridge  - 1 x daily - 7 x weekly - 3 sets - 10 reps - Seated Ankle  Plantarflexion with Resistance  - 1 x daily - 7 x weekly - 3 sets - 10 reps - Seated Hip Abduction with Resistance  - 1 x daily - 7 x weekly - 3 sets - 10 reps - Seated Long Arc Quad  - 1 x daily - 7 x weekly - 3 sets - 10 reps - Seated Knee Extension with Resistance  - 1 x daily - 7 x weekly - 3 sets - 10 reps - Seated Hamstring Curls with Resistance  - 1 x daily - 7 x weekly - 3 sets - 10 reps - Supine Active Straight Leg Raise  - 1 x daily - 7 x weekly - 2-3 sets - 5-10 reps - Prone Knee Flexion  - 1 x daily - 7 x weekly - 2-3 sets - 10 reps - Prone Hip Extension  - 1 x daily - 7 x weekly - 2-3 sets - 10 reps - Mini Squat with Chair  - 1 x daily - 7 x weekly - 3 sets - 10 reps   ASSESSMENT:  CLINICAL IMPRESSION:  Pt arrived with cane today. We assessed DGI today with her showing mild limitation with most activities, overall score was 17/24 showing an increased risk for falls. MT focused on lateral thigh as patient was noting some pain along the distal attachment of ITB. Exercises focused on proximal strength to improve knee stability and support. Added clamshells to focus more on ER strengthening to help with genu valgus. Jaylanni Grahovac continues to demonstrate potential for improvement and would benefit from continued skilled therapy to address impairments.    OBJECTIVE IMPAIRMENTS: Abnormal gait, decreased activity tolerance, decreased balance, decreased endurance, difficulty walking, decreased strength, and pain.   ACTIVITY LIMITATIONS: carrying, lifting, squatting, stairs, transfers, bed mobility, bathing, dressing, and locomotion level  PARTICIPATION LIMITATIONS: meal prep, cleaning, laundry, shopping, community activity, and church  PERSONAL FACTORS: Age, Fitness, Past/current experiences, Time since onset of injury/illness/exacerbation, and 1-2 comorbidities: h/o  R THA, and R TKA, HTN  are also affecting patient's functional outcome.   REHAB POTENTIAL: Good  CLINICAL DECISION  MAKING: Stable/uncomplicated  EVALUATION COMPLEXITY: Low   GOALS: Goals reviewed with patient? Yes  SHORT TERM GOALS: Target date: 2 weeks   Patient will be independent with initial HEP. Baseline: initiated at eval Goal status: MET 10/01/2022   LONG TERM GOALS: Target date: 8 weeks, 10/30/22  Patient will be independent with advanced/ongoing HEP to improve outcomes and carryover.  Baseline :  established at eval Goal status: IN PROGRESS  2.  Patient will report at least 75% improvement in L knee pain to improve QOL. Baseline: up to 8/10 Goal status: IN PROGRESS  3.  Patient will demonstrate improved improved KOOS 12 to 44/100 Baseline: 33/100 Goal status: IN PROGRESS  4.  Patient will demonstrate improved L quads strength to >/= 4+//5 for improved stability and ease of mobility. Baseline: 4- Goal status: IN PROGRESS  5.  Patient will be able to ambulate 600' with LRAD  without increased pain to access community.  Baseline: 150' with rollator and pain L knee Goal status: IN PROGRESS  6. Patient will be able to ascend/descend one curb/step  with rollator for safety for community gait such as going into hair salon. Baseline: needs + 1 assist Goal status: IN PROGRESS  7.  Patient will demonstrate at least 19/24 on DGI to decrease risk of falls. Baseline: TBD Goal status: IN PROGRESS    PLAN:  PT FREQUENCY: 1x/week  PT DURATION: 8 weeks  PLANNED INTERVENTIONS: Therapeutic exercises, Therapeutic activity, Neuromuscular re-education, Balance training, Gait training, Patient/Family education, Self Care, and Joint mobilization  PLAN FOR NEXT SESSION: continue hip/knee strengthening, hyperextends L knee and severe genu valgus.  go over curb safety.   Darleene Cleaver, PTA 10/10/2022, 12:03 PM

## 2022-10-14 ENCOUNTER — Other Ambulatory Visit: Payer: Self-pay

## 2022-10-14 ENCOUNTER — Ambulatory Visit: Payer: Medicare Other

## 2022-10-14 DIAGNOSIS — G8929 Other chronic pain: Secondary | ICD-10-CM

## 2022-10-14 DIAGNOSIS — R293 Abnormal posture: Secondary | ICD-10-CM

## 2022-10-14 DIAGNOSIS — M25562 Pain in left knee: Secondary | ICD-10-CM | POA: Diagnosis not present

## 2022-10-14 DIAGNOSIS — R262 Difficulty in walking, not elsewhere classified: Secondary | ICD-10-CM

## 2022-10-14 NOTE — Therapy (Signed)
OUTPATIENT PHYSICAL THERAPY TREATMENT   Patient Name: Tiffany Byrd MRN: [id number] DOB:1940-10-14, 82 y.o., female Today's Date: 10/14/2022   END OF SESSION:  PT End of Session - 10/14/22 1025     Visit Number 5    Date for PT Re-Evaluation 10/30/22    Progress Note Due on Visit 10    PT Start Time 1016    PT Stop Time 1100    PT Time Calculation (min) 44 min    Activity Tolerance Patient tolerated treatment well    Behavior During Therapy WFL for tasks assessed/performed                Past Medical History:  Diagnosis Date   GERD (gastroesophageal reflux disease)    Hyperlipidemia    Stroke (HCC)    Thyroid disease    Past Surgical History:  Procedure Laterality Date   ABDOMINAL HYSTERECTOMY     CHOLECYSTECTOMY     LUMBAR LAMINECTOMY/DECOMPRESSION MICRODISCECTOMY     REPLACEMENT TOTAL KNEE     Patient Active Problem List   Diagnosis Date Noted   Gait difficulty 02/03/2022   Metabolic bone disease 11/05/2021   Benign hypertension with CKD (chronic kidney disease) stage III (HCC) 07/29/2021   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 07/29/2021   Acute stroke due to ischemia (HCC) 07/14/2021   Mixed hyperlipidemia 07/14/2021   Essential hypertension 07/14/2021   GERD without esophagitis 07/14/2021   Facet arthropathy, lumbosacral 07/03/2021   Lumbar radiculopathy 07/03/2021   Chronic kidney disease, stage 3a (HCC) 04/30/2021   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   Closed fracture of left tibial plateau 04/09/2020   History of total right knee replacement 03/05/2020   Primary osteoarthritis of right hip 03/05/2020   Primary osteoarthritis of left knee 03/05/2020   PVD (posterior vitreous detachment), both eyes 08/04/2019   Acute renal insufficiency 06/22/2019   Greater trochanteric bursitis of left hip 10/06/2018   Episodic tension-type headache, not intractable 03/17/2018   Status post lumbar  spine operative procedure for decompression of spinal cord 11/19/2016   Sensorineural hearing loss, bilateral 05/15/2015   Hallux valgus with bunions of right foot 04/04/2015   Hypothyroidism 04/04/2015   Other chondrocalcinosis, unspecified site 10/20/2014   Pseudogout of left knee 07/06/2014   Primary localized osteoarthrosis of ankle and foot 04/11/2008    PCP: Raynelle Jan., MD   REFERRING PROVIDER: Jodi Geralds, MD   REFERRING DIAG: M25.569 (ICD-10-CM) - Knee pain   THERAPY DIAG:  Chronic pain of left knee  Abnormal posture  Difficulty in walking, not elsewhere classified  RATIONALE FOR EVALUATION AND TREATMENT: Rehabilitation  ONSET DATE: chronic, worse last 6 months  NEXT MD VISIT: in one week, series of injections over next 3 weeks   SUBJECTIVE:  SUBJECTIVE STATEMENT: Tiffany Byrd reports she pulled her back trying to stretch the other day.  So I'm having to use my rollator today. The brace is large on my knee, I think I'm not in as much pain as I used to be in my knee since the 3 shots were completed.  PAIN: Are you having pain? Yes: NPRS scale: 6/10 Pain location: L medial knee primarily Pain description: stabbing, pulsing , throbbing pain Aggravating factors: sit to stand, walking Relieving factors: rest, ice, heat   PERTINENT HISTORY:  R TKA; R THA, OA - R hip, L knee, ankle and foot; L hip greater trochanteric bursitis; CVA ; lumbar lami/decompression/microdiscectomy;  metabolic bone disease; HTN; hypothyroidism; hearing loss, recently widowed  PRECAUTIONS: None  RED FLAGS: None  WEIGHT BEARING RESTRICTIONS: No  FALLS:  Has patient fallen in last 6 months? No  LIVING ENVIRONMENT: Lives with: lives with their son Lives in: House/apartment Stairs:  No Has following equipment at home: Single point cane, Environmental consultant - 2 wheeled, and Environmental consultant - 4 wheeled  OCCUPATION: retired  PLOF: Independent  PATIENT GOALS: I want to have more independence and quality of life.  My L knee hurts and feels so unstable that I'm really afraid of falling, have to get my son to go with me to grocery store, etc   OBJECTIVE: (objective measures completed at initial evaluation unless otherwise dated)  DIAGNOSTIC FINDINGS:  05/15/22 - L knee x-ray:  IMPRESSION: No acute finding. No change in advanced tricompartmental osteoarthritis. Small joint effusion.  PATIENT SURVEYS:  KOOS 12 :33/100  COGNITION: Overall cognitive status: Within functional limits for tasks assessed    SENSATION: WFL    MUSCLE LENGTH: Hamstrings: Right wfl deg; Left wfl deg Maisie Fus test: Right wfl deg; Left wfl deg Hamstrings: wfl ITB: wfl Piriformis: wfl Hip flexors: wfl Quads: wfl Heelcord: wfl  POSTURE:  Hyperextended L knee by at least 15 degrees, valgum as well, mild iliac crest height difference with R iliac crest elevated 1 cm  PALPATION: Tender along medial L knee jt line, noted excessive jt play L knee with testing L hip rotation and abduction  LOWER EXTREMITY ROM:  Active ROM Right eval Left   eval  Hip flexion    Hip extension    Hip abduction    Hip adduction    Hip internal rotation    Hip external rotation    Knee flexion  126  Knee extension  114 degrees hyperext  Ankle dorsiflexion    Ankle plantarflexion    Ankle inversion    Ankle eversion     LOWER EXTREMITY MMT:  MMT Right eval Left   eval  Hip flexion  4/5  Hip extension    Hip abduction  4/5  Hip adduction    Hip internal rotation    Hip external rotation    Knee flexion    Knee extension  4-/5  Ankle dorsiflexion    Ankle plantarflexion    Ankle inversion    Ankle eversion     (Blank rows = not tested)  FUNCTIONAL TESTS:  10 M walk test 12 sec, 0.8 m/sec  GAIT: Distance  walked: up to 100' in department Assistive device utilized: Environmental consultant - 4 wheeled Level of assistance: Modified independence Gait pattern: step through pattern and genu recurvatum- Left Comments: marked instability L knee with medial L tibial movement on femur and recurvatum   TODAY'S TREATMENT:  10/14/22: Therapeutic exercise: guided, instructed the patient in the following exercises, focusing on strength  and stability LE's, particularly L knee musculature:   Utilized moist heat under lower back for the supine ex due to her LBP Recumbent bike level 3, 6 m Seated long arc quads 3#, 3 x 10 Seated hamstring curls, green, 3 x 10 Supine for bridging 2 x 10 Supine with smaller orange physioball, for alt SLR 10x Supine small orange physioball, B knee to chest, cues to slow movements, control with b knee extension 10 x ea Supine hooklying B hip abd/ER with green band 10 x 2  10/10/22 Therapeutic Exercise: to improve strength and mobility.  Demo, verbal and tactile cues throughout for technique. Bike L1x74min DGI :17/24 S/L clamshell 2x10 R side SLR 2x10 bil- cues to avoid genu recurvatum with LLE Bridge with staggered feet (LLE behind for more activation) 2x10  Manual Therapy: STM to L ITB, lateral quads in R S/L   10/01/22 Therapeutic Exercise: to improve strength and mobility.  Demo, verbal and tactile cues throughout for technique. Bike L1 x 6 min  Supine SLR 3 x 5 each side Bridges 2 x 10  Prone knee bends 2 x 10 cues for eccentric control Prone hip extension 2 x 10 bil - tried with bent knee but unable due to HS weakness on L Minisquats x 10 with bil UE support on chair arms.  Review of seated exercises  09/10/22 Recumbent Bike L1x84min Supine bridge 3x10 Modified lunges x 10  Standing hip abduction x 10 RTB at thigh Standing hip extension x 10 RTB at thigh L modified SLS working on standing w/o genu recurvatum  Gentle STM to medial jt line and hip Adductors  09/04/22:  PT  evaluation:  instructed in HEP as described below   PATIENT EDUCATION:  Education details: HEP review and HEP update - added clamshells Person educated: Patient Education method: Explanation, Demonstration, Tactile cues, Verbal cues, and Handouts Education comprehension: verbalized understanding, returned demonstration, and verbal cues required  HOME EXERCISE PROGRAM: Instructed in the following: Access Code: [email or web address] URL: [email or web address] Date: 10/01/2022 Prepared by: Harrie Foreman  Exercises - Supine Bridge  - 1 x daily - 7 x weekly - 3 sets - 10 reps - Seated Ankle Plantarflexion with Resistance  - 1 x daily - 7 x weekly - 3 sets - 10 reps - Seated Hip Abduction with Resistance  - 1 x daily - 7 x weekly - 3 sets - 10 reps - Seated Long Arc Quad  - 1 x daily - 7 x weekly - 3 sets - 10 reps - Seated Knee Extension with Resistance  - 1 x daily - 7 x weekly - 3 sets - 10 reps - Seated Hamstring Curls with Resistance  - 1 x daily - 7 x weekly - 3 sets - 10 reps - Supine Active Straight Leg Raise  - 1 x daily - 7 x weekly - 2-3 sets - 5-10 reps - Prone Knee Flexion  - 1 x daily - 7 x weekly - 2-3 sets - 10 reps - Prone Hip Extension  - 1 x daily - 7 x weekly - 2-3 sets - 10 reps - Mini Squat with Chair  - 1 x daily - 7 x weekly - 3 sets - 10 reps   ASSESSMENT:  CLINICAL IMPRESSION:  Pt utilizing rollator today for additional support due to lower back strain.  Reassessed her L quads and hams strength, 3-/5 each, also SLR needs much manual assist due to distal weakness L quads.  She is to see her orthopedist  next week and will ask him about a different brace for L knee as she feels that the one she has is not supporting her as she expected.  Also recent pain R ant thigh with everyday activities.  May be overusing R thigh musculature to compensate for L knee instability.  She did respond well to the therex for strengthening L knee musculature today. Tiffany Byrd  continues to demonstrate potential for improvement and would benefit from continued skilled therapy to address impairments.    OBJECTIVE IMPAIRMENTS: Abnormal gait, decreased activity tolerance, decreased balance, decreased endurance, difficulty walking, decreased strength, and pain.   ACTIVITY LIMITATIONS: carrying, lifting, squatting, stairs, transfers, bed mobility, bathing, dressing, and locomotion level  PARTICIPATION LIMITATIONS: meal prep, cleaning, laundry, shopping, community activity, and church  PERSONAL FACTORS: Age, Fitness, Past/current experiences, Time since onset of injury/illness/exacerbation, and 1-2 comorbidities: h/o R THA, and R TKA, HTN  are also affecting patient's functional outcome.   REHAB POTENTIAL: Good  CLINICAL DECISION MAKING: Stable/uncomplicated  EVALUATION COMPLEXITY: Low   GOALS: Goals reviewed with patient? Yes  SHORT TERM GOALS: Target date: 2 weeks   Patient will be independent with initial HEP. Baseline: initiated at eval Goal status: MET 10/01/2022   LONG TERM GOALS: Target date: 8 weeks, 10/30/22  Patient will be independent with advanced/ongoing HEP to improve outcomes and carryover.  Baseline :  established at eval Goal status: IN PROGRESS  2.  Patient will report at least 75% improvement in L knee pain to improve QOL. Baseline: up to 8/10 Goal status: IN PROGRESS  3.  Patient will demonstrate improved improved KOOS 12 to 44/100 Baseline: 33/100 Goal status: IN PROGRESS  4.  Patient will demonstrate improved L quads strength to >/= 4+//5 for improved stability and ease of mobility. Baseline: 4- Goal status: IN PROGRESS  5.  Patient will be able to ambulate 600' with LRAD  without increased pain to access community.  Baseline: 150' with rollator and pain L knee Goal status: IN PROGRESS  6. Patient will be able to ascend/descend one curb/step  with rollator for safety for community gait such as going into hair salon. Baseline:  needs + 1 assist Goal status: IN PROGRESS  7.  Patient will demonstrate at least 19/24 on DGI to decrease risk of falls. Baseline: TBD Goal status: IN PROGRESS    PLAN:  PT FREQUENCY: 1x/week  PT DURATION: 8 weeks  PLANNED INTERVENTIONS: Therapeutic exercises, Therapeutic activity, Neuromuscular re-education, Balance training, Gait training, Patient/Family education, Self Care, and Joint mobilization  PLAN FOR NEXT SESSION: continue hip/knee strengthening, hyperextends L knee and severe genu valgus.    Ritika Hellickson L Elke Holtry, PT, DPT, OCS 10/14/2022, 1:20 PM

## 2022-10-17 ENCOUNTER — Ambulatory Visit: Payer: Medicare Other

## 2022-10-20 NOTE — Progress Notes (Signed)
Carelink Summary Report / Loop Recorder 

## 2022-10-31 ENCOUNTER — Telehealth: Payer: Self-pay | Admitting: Cardiology

## 2022-10-31 NOTE — Telephone Encounter (Signed)
   Pre-operative Risk Assessment    Patient Name: Tiffany Byrd  DOB: Nov 24, 1940 MRN: [id number]      Request for Surgical Clearance    Procedure:   LEFT TOTAL KNEE REPLACEMENT  Date of Surgery:  Clearance TBD                                 Surgeon:  Dr. Jodi Geralds Surgeon's Group or Practice Name:  [facility] Phone number:  [phone]  Fax number:  [phone] or [phone]   Type of Clearance Requested:   - Medical  - Pharmacy:  Hold    unclear on medication   Type of Anesthesia:  Spinal   Additional requests/questions:    Emmaline Kluver   10/31/2022, 4:50 PM

## 2022-11-03 ENCOUNTER — Telehealth: Payer: Self-pay

## 2022-11-03 NOTE — Telephone Encounter (Signed)
Pt is scheduled for tele appt on 11/19/22 at 9am. Pt states that her procedure will be sometime in October 2024. Med rec and consent done    Patient Consent for Virtual Visit        Tiffany Byrd has provided verbal consent on 11/03/2022 for a virtual visit (video or telephone).   CONSENT FOR VIRTUAL VISIT FOR:  Tiffany Byrd  By participating in this virtual visit I agree to the following:  I hereby voluntarily request, consent and authorize [facility] and its employed or contracted physicians, physician assistants, nurse practitioners or other licensed health care professionals (the Practitioner), to provide me with telemedicine health care services (the "Services") as deemed necessary by the treating Practitioner. I acknowledge and consent to receive the Services by the Practitioner via telemedicine. I understand that the telemedicine visit will involve communicating with the Practitioner through live audiovisual communication technology and the disclosure of certain medical information by electronic transmission. I acknowledge that I have been given the opportunity to request an in-person assessment or other available alternative prior to the telemedicine visit and am voluntarily participating in the telemedicine visit.  I understand that I have the right to withhold or withdraw my consent to the use of telemedicine in the course of my care at any time, without affecting my right to future care or treatment, and that the Practitioner or I may terminate the telemedicine visit at any time. I understand that I have the right to inspect all information obtained and/or recorded in the course of the telemedicine visit and may receive copies of available information for a reasonable fee.  I understand that some of the potential risks of receiving the Services via telemedicine include:  Delay or interruption in medical evaluation due to technological equipment failure or disruption; Information  transmitted may not be sufficient (e.g. poor resolution of images) to allow for appropriate medical decision making by the Practitioner; and/or  In rare instances, security protocols could fail, causing a breach of personal health information.  Furthermore, I acknowledge that it is my responsibility to provide information about my medical history, conditions and care that is complete and accurate to the best of my ability. I acknowledge that Practitioner's advice, recommendations, and/or decision may be based on factors not within their control, such as incomplete or inaccurate data provided by me or distortions of diagnostic images or specimens that may result from electronic transmissions. I understand that the practice of medicine is not an exact science and that Practitioner makes no warranties or guarantees regarding treatment outcomes. I acknowledge that a copy of this consent can be made available to me via my patient portal [facility] MyChart), or I can request a printed copy by calling the office of [facility].    I understand that my insurance will be billed for this visit.   I have read or had this consent read to me. I understand the contents of this consent, which adequately explains the benefits and risks of the Services being provided via telemedicine.  I have been provided ample opportunity to ask questions regarding this consent and the Services and have had my questions answered to my satisfaction. I give my informed consent for the services to be provided through the use of telemedicine in my medical care

## 2022-11-03 NOTE — Telephone Encounter (Signed)
1st attempt at scheduling tele preop. [email or web address]

## 2022-11-03 NOTE — Telephone Encounter (Signed)
   Name: Tiffany Byrd  DOB: [birth year]  MRN: [id number]  Primary Cardiologist: None   Preoperative team, please contact this patient and set up a phone call appointment for further preoperative risk assessment. Please obtain consent and complete medication review. Thank you for your help.Last seen 02/03/2022 by Dr. Elberta Fortis.   I confirm that guidance regarding antiplatelet and oral anticoagulation therapy has been completed and, if necessary, noted below.  Per office protocol, if patient is without any new symptoms or concerns at the time of their virtual visit, he/she may hold ASA for 7 days prior to procedure. Please resume ASA as soon as possible postprocedure, at the discretion of the surgeon.     Joni Reining, NP 11/03/2022, 7:50 AM [facility]

## 2022-11-03 NOTE — Telephone Encounter (Signed)
Pt is scheduled for tele appt on 11/19/22 at 9am. Pt states that her procedure will be sometime in October 2024. Med rec and consent done

## 2022-11-04 LAB — CUP PACEART REMOTE DEVICE CHECK
Date Time Interrogation Session: 20240909230337
Implantable Pulse Generator Implant Date: 20231211

## 2022-11-10 ENCOUNTER — Ambulatory Visit (INDEPENDENT_AMBULATORY_CARE_PROVIDER_SITE_OTHER): Payer: Medicare Other

## 2022-11-10 DIAGNOSIS — I639 Cerebral infarction, unspecified: Secondary | ICD-10-CM | POA: Diagnosis not present

## 2022-11-19 ENCOUNTER — Ambulatory Visit: Payer: Medicare Other | Attending: Cardiology

## 2022-11-19 DIAGNOSIS — Z01810 Encounter for preprocedural cardiovascular examination: Secondary | ICD-10-CM | POA: Diagnosis not present

## 2022-11-19 NOTE — Progress Notes (Signed)
Virtual Visit via Telephone Note   Because of Akshadha Yanik co-morbid illnesses, she is at least at moderate risk for complications without adequate follow up.  This format is felt to be most appropriate for this patient at this time.  The patient did not have access to video technology/had technical difficulties with video requiring transitioning to audio format only (telephone).  All issues noted in this document were discussed and addressed.  No physical exam could be performed with this format.  Please refer to the patient's chart for her consent to telehealth for [facility].  Evaluation Performed:  Preoperative cardiovascular risk assessment _____________   Date:  11/19/2022   Patient ID:  Tiffany Byrd, DOB 1940-07-08, MRN [id number] Patient Location:  Home Provider location:   Office  Primary Care Provider:  Raynelle Jan., MD Primary Cardiologist:  None  Chief Complaint / Patient Profile   82 y.o. y/o female with a h/o HTN, HLD, CVA who is pending LEFT TOTAL KNEE REPLACEMENT  and presents today for telephonic preoperative cardiovascular risk assessment.  History of Present Illness    Tiffany Byrd is a 82 y.o. female who presents via audio/video conferencing for a telehealth visit today.  Pt was last seen in cardiology clinic on 02/03/22 by Dr. Elberta Fortis.  At that time Tiffany Byrd was doing well .  The patient is now pending procedure as outlined above. Since her last visit, she remains stable form a cardiac standpoint.   Today she denies chest pain, shortness of breath, lower extremity edema, fatigue, palpitations, melena, hematuria, hemoptysis, diaphoresis, weakness, presyncope, syncope, orthopnea, and PND.   Past Medical History    Past Medical History:  Diagnosis Date   GERD (gastroesophageal reflux disease)    Hyperlipidemia    Stroke (HCC)    Thyroid disease    Past Surgical History:  Procedure Laterality Date   ABDOMINAL HYSTERECTOMY      CHOLECYSTECTOMY     LUMBAR LAMINECTOMY/DECOMPRESSION MICRODISCECTOMY     REPLACEMENT TOTAL KNEE      Allergies  Allergies  Allergen Reactions   Latex    Penicillins    Hydrocodone-Acetaminophen Anxiety    Home Medications    Prior to Admission medications   Medication Sig Start Date End Date Taking? Authorizing Provider  acetaminophen (TYLENOL) 500 MG tablet Take 500 mg by mouth every 6 (six) hours as needed for moderate pain.    [provider]  aspirin EC 81 MG tablet Take 1 tablet (81 mg total) by mouth daily. Swallow whole. 07/16/21   Rolly Salter, MD  atenolol (TENORMIN) 25 MG tablet Take 25 mg by mouth at bedtime. 04/30/21   [provider]  atorvastatin (LIPITOR) 80 MG tablet Take 1 tablet (80 mg total) by mouth daily. 07/16/21   Rolly Salter, MD  cholecalciferol (VITAMIN D) 400 units TABS tablet Take 400 Units by mouth.    [provider]  Cyclobenzaprine HCl (FLEXERIL PO) Take 1 tablet by mouth daily as needed (muscle spasms).    [provider]  diclofenac Sodium (VOLTAREN) 1 % GEL Apply 4 g topically 4 (four) times daily. 04/03/22   Melene Plan, DO  levothyroxine (SYNTHROID) 75 MCG tablet Take 75 mcg by mouth daily before breakfast.    [provider]  meclizine (ANTIVERT) 25 MG tablet Take 1 tablet (25 mg total) by mouth 3 (three) times daily as needed for dizziness. 06/11/22   Glyn Ade, MD  Multiple Vitamin (MULTIVITAMIN) LIQD Take 5 mLs by mouth  daily.    [provider]  pantoprazole (PROTONIX) 40 MG tablet Take 1 tablet (40 mg total) by mouth daily. 07/16/21   Rolly Salter, MD  potassium chloride SA (KLOR-CON M) 20 MEQ tablet Take 1 tablet (20 mEq total) by mouth 2 (two) times daily for 5 days. 06/11/22 11/03/22  Glyn Ade, MD  pregabalin (LYRICA) 75 MG capsule Take 1 capsule (75 mg total) by mouth 2 (two) times daily. Take 75 mg in morning and 150 mg at night1 12/23/21   Micki Riley, MD   triamterene-hydrochlorothiazide (DYAZIDE) 37.5-25 MG capsule Take 1 capsule by mouth daily.    [provider]    Physical Exam    Vital Signs:  Tiffany Byrd does not have vital signs available for review today.  Given telephonic nature of communication, physical exam is limited. AAOx3. NAD. Normal affect.  Speech and respirations are unlabored.  Accessory Clinical Findings    None  Assessment & Plan    1.  Preoperative Cardiovascular Risk Assessment:  LEFT TOTAL KNEE REPLACEMENT ,  Dr. Jodi Geralds, [facility],  Fax number:  [phone] or [phone]   Her RCRI is a class II risk, 0.9% risk of major cardiac event. She is able to complete greater than 4 mets of physical activity.   Primary Cardiologist: Dr. Elberta Fortis  Chart reviewed as part of pre-operative protocol coverage. Given past medical history and time since last visit, based on ACC/AHA guidelines, Tiffany Byrd would be at acceptable risk for the planned procedure without further cardiovascular testing.     Patient was advised that if she develops new symptoms prior to surgery to contact our office to arrange a follow-up appointment.  She verbalized understanding.  Per office protocol, if patient is without any new symptoms or concerns at the time of their virtual visit, he/she may hold ASA for 7 days prior to procedure. Please resume ASA as soon as possible postprocedure, at the discretion of the surgeon.       Time:   Today, I have spent 7:30 minutes with the patient with telehealth technology discussing medical history, symptoms, and management plan.  Prior to patient's phone evaluation I spent greater than 10 minutes reviewing their past medical history and cardiac medications.    Tiffany Asters, NP  11/19/2022, 7:15 AM

## 2022-11-21 ENCOUNTER — Telehealth: Payer: Self-pay | Admitting: Cardiology

## 2022-11-21 NOTE — Telephone Encounter (Signed)
Faxed over surgical clearance to fax # [phone].

## 2022-11-21 NOTE — Telephone Encounter (Signed)
Caller Eunice Blase) is following-up on the status of the patient's clearance.  Caller stated clearance can be faxed to [phone].

## 2022-11-24 NOTE — Progress Notes (Signed)
Carelink Summary Report / Loop Recorder 

## 2022-12-05 ENCOUNTER — Telehealth: Payer: Self-pay | Admitting: Cardiology

## 2022-12-05 NOTE — Telephone Encounter (Signed)
Spoke with Lequita Halt from requesting office and she states that she was just making sure that office visit addressed clearance. I directed on were to look for clearance. She states that was all shed needed and patient is good to go.

## 2022-12-05 NOTE — Telephone Encounter (Signed)
Office calling to state some of the clearance was missing and they need it faxed over again to [phone]. Please advise

## 2022-12-15 ENCOUNTER — Ambulatory Visit (INDEPENDENT_AMBULATORY_CARE_PROVIDER_SITE_OTHER): Payer: Medicare Other

## 2022-12-15 DIAGNOSIS — I639 Cerebral infarction, unspecified: Secondary | ICD-10-CM

## 2022-12-16 LAB — CUP PACEART REMOTE DEVICE CHECK
Date Time Interrogation Session: 20241020231539
Implantable Pulse Generator Implant Date: 20231211

## 2022-12-31 NOTE — Progress Notes (Signed)
Carelink Summary Report / Loop Recorder 

## 2023-01-02 NOTE — Progress Notes (Unsigned)
Tiffany Byrd D.[facility] [address] [place] [address] Phone: [phone]   Assessment and Plan:     There are no diagnoses linked to this encounter.  ***   Pertinent previous records reviewed include ***    Follow Up: ***     Subjective:   I, Tiffany Byrd, am serving as a Neurosurgeon for Doctor Richardean Sale   Chief Complaint: left knee   HPI:  07/03/2021 Tiffany Byrd is a 82 y.o. female that is presenting with acute on chronic left knee pain and acute on chronic back pain with left-sided radicular pain.  She reports the knee pain has been bothering her over the past few months.  The back pain is acute on chronic.  Has a history of bone spurs being taken off of her back.  She is still having pain in the back as well as down the left leg.   Review of the lumbar spine x-ray from 2021 shows levoscoliosis with apex at L1-2, an 8 mm left lateral listhesis and a L4-5 3 mm right lateral listhesis with disc degenerative changes and facet arthropathy in the lumbar spine. 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   Current Outpatient Medications:    acetaminophen (TYLENOL) 500 MG tablet, Take 500 mg by mouth every 6 (six) hours as needed for  moderate pain., Disp: , Rfl:    aspirin EC 81 MG tablet, Take 1 tablet (81 mg total) by mouth daily. Swallow whole., Disp: 120 tablet, Rfl: 0   atenolol (TENORMIN) 25 MG tablet, Take 25 mg by mouth at bedtime., Disp: , Rfl:    atorvastatin (LIPITOR) 80 MG tablet, Take 1 tablet (80 mg total) by mouth daily., Disp: 30 tablet, Rfl: 0   cholecalciferol (VITAMIN D) 400 units TABS tablet, Take 400 Units by mouth., Disp: , Rfl:    Cyclobenzaprine HCl (FLEXERIL PO), Take 1 tablet by mouth daily as needed (muscle spasms)., Disp: , Rfl:    diclofenac Sodium (VOLTAREN) 1 % GEL, Apply 4 g topically 4 (four) times daily., Disp: 100 g, Rfl: 0   levothyroxine (SYNTHROID) 75 MCG tablet, Take 75 mcg by mouth daily before breakfast., Disp: , Rfl:    meclizine (ANTIVERT) 25 MG tablet, Take 1 tablet (25 mg total) by mouth 3 (three) times daily as needed for dizziness., Disp: 30 tablet, Rfl: 0   Multiple Vitamin (MULTIVITAMIN) LIQD, Take 5 mLs by mouth daily., Disp: , Rfl:    pantoprazole (PROTONIX) 40 MG tablet, Take 1 tablet (40 mg total) by mouth daily., Disp: 30 tablet, Rfl: 0   potassium chloride SA (KLOR-CON M) 20 MEQ tablet, Take 1 tablet (20 mEq total) by mouth 2 (two) times daily for 5  days., Disp: 10 tablet, Rfl: 0   pregabalin (LYRICA) 75 MG capsule, Take 1 capsule (75 mg total) by mouth 2 (two) times daily. Take 75 mg in morning and 150 mg at night1, Disp: 1 capsule, Rfl: 1   triamterene-hydrochlorothiazide (DYAZIDE) 37.5-25 MG capsule, Take 1 capsule by mouth daily., Disp: , Rfl:   Current Facility-Administered Medications:    triamcinolone acetonide (KENALOG-40) injection 40 mg, 40 mg, Intra-articular, Once, Tiffany Rude, MD   Objective:     There were no vitals filed for this visit.    There is no height or weight on file to calculate BMI.    Physical Exam:    ***   Electronically signed by:  Tiffany Byrd D.[facility] 7:33 AM 01/02/23

## 2023-01-05 ENCOUNTER — Ambulatory Visit: Payer: Medicare Other | Admitting: Sports Medicine

## 2023-01-05 VITALS — BP 122/84 | HR 71 | Ht 60.0 in | Wt 176.0 lb

## 2023-01-05 DIAGNOSIS — M1712 Unilateral primary osteoarthritis, left knee: Secondary | ICD-10-CM

## 2023-01-05 NOTE — Patient Instructions (Signed)
Tylenol [id number] mg 2-3 times a day for pain relief  Follow up wit orthopedic surgery

## 2023-01-19 ENCOUNTER — Ambulatory Visit (INDEPENDENT_AMBULATORY_CARE_PROVIDER_SITE_OTHER): Payer: Medicare Other

## 2023-01-19 DIAGNOSIS — I639 Cerebral infarction, unspecified: Secondary | ICD-10-CM | POA: Diagnosis not present

## 2023-01-19 LAB — CUP PACEART REMOTE DEVICE CHECK
Date Time Interrogation Session: 20241122230224
Implantable Pulse Generator Implant Date: 20231211

## 2023-01-28 ENCOUNTER — Encounter: Payer: Self-pay | Admitting: Physical Therapy

## 2023-01-28 ENCOUNTER — Other Ambulatory Visit: Payer: Self-pay

## 2023-01-28 ENCOUNTER — Ambulatory Visit: Payer: Medicare Other | Attending: Orthopedic Surgery | Admitting: Physical Therapy

## 2023-01-28 DIAGNOSIS — M25562 Pain in left knee: Secondary | ICD-10-CM | POA: Diagnosis present

## 2023-01-28 DIAGNOSIS — M6281 Muscle weakness (generalized): Secondary | ICD-10-CM | POA: Diagnosis present

## 2023-01-28 DIAGNOSIS — M25662 Stiffness of left knee, not elsewhere classified: Secondary | ICD-10-CM

## 2023-01-28 DIAGNOSIS — R262 Difficulty in walking, not elsewhere classified: Secondary | ICD-10-CM

## 2023-01-28 DIAGNOSIS — R600 Localized edema: Secondary | ICD-10-CM

## 2023-01-28 NOTE — Therapy (Signed)
OUTPATIENT PHYSICAL THERAPY LOWER EXTREMITY EVALUATION   Patient Name: Tiffany Byrd MRN: [id number] DOB:Jul 01, 1940, 82 y.o., female Today's Date: 01/28/2023  END OF SESSION:  PT End of Session - 01/28/23 1024     Visit Number 1    Date for PT Re-Evaluation 03/11/23    Authorization Type [employer] Medicare    Progress Note Due on Visit 10    PT Start Time 1020    PT Stop Time 1110    PT Time Calculation (min) 50 min    Activity Tolerance Patient tolerated treatment well    Behavior During Therapy WFL for tasks assessed/performed             Past Medical History:  Diagnosis Date   GERD (gastroesophageal reflux disease)    Hyperlipidemia    Stroke (HCC)    Thyroid disease    Past Surgical History:  Procedure Laterality Date   ABDOMINAL HYSTERECTOMY     CHOLECYSTECTOMY     LUMBAR LAMINECTOMY/DECOMPRESSION MICRODISCECTOMY     REPLACEMENT TOTAL KNEE     Patient Active Problem List   Diagnosis Date Noted   Gait difficulty 02/03/2022   Metabolic bone disease 11/05/2021   Benign hypertension with CKD (chronic kidney disease) stage III (HCC) 07/29/2021   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 07/29/2021   Acute stroke due to ischemia (HCC) 07/14/2021   Mixed hyperlipidemia 07/14/2021   Essential hypertension 07/14/2021   GERD without esophagitis 07/14/2021   Facet arthropathy, lumbosacral 07/03/2021   Lumbar radiculopathy 07/03/2021   Chronic kidney disease, stage 3a (HCC) 04/30/2021   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   Closed fracture of left tibial plateau 04/09/2020   History of total right knee replacement 03/05/2020   Primary osteoarthritis of right hip 03/05/2020   Primary osteoarthritis of left knee 03/05/2020   PVD (posterior vitreous detachment), both eyes 08/04/2019   Acute renal insufficiency 06/22/2019   Greater trochanteric bursitis of left hip 10/06/2018   Episodic tension-type headache, not  intractable 03/17/2018   Status post lumbar spine operative procedure for decompression of spinal cord 11/19/2016   Sensorineural hearing loss, bilateral 05/15/2015   Hallux valgus with bunions of right foot 04/04/2015   Hypothyroidism 04/04/2015   Other chondrocalcinosis, unspecified site 10/20/2014   Pseudogout of left knee 07/06/2014   Primary localized osteoarthrosis of ankle and foot 04/11/2008    PCP: Raynelle Jan., MD   REFERRING PROVIDER: Jodi Geralds, MD  REFERRING DIAG: [phone] (ICD-10-CM) - S/P TKR (total knee replacement)   THERAPY DIAG:  Acute pain of left knee  Stiffness of left knee, not elsewhere classified  Muscle weakness (generalized)  Localized edema  Difficulty in walking, not elsewhere classified  Rationale for Evaluation and Treatment: Rehabilitation  ONSET DATE: s/p L TKR 01/14/23  SUBJECTIVE:   SUBJECTIVE STATEMENT: Pt. Had L TKR on 01/14/23.  She was supposed to have HHPT but they never came out.  She did have TKR on R so has been doing exercises herself, moving, and using walker taking care to not fall.  Using ice and heat for pain, they did a block for pain, uses ice for swelling, heat helps with the pain.   Bandage was removed yesterday, returns in two weeks.  Pain is not too bad.   NEXT MD VISIT: Feb 10, 2023 with Dr. Luiz Blare  PERTINENT HISTORY: R TKA; R THA, OA - R hip, L knee, ankle and foot; L hip greater trochanteric bursitis; CVA ; lumbar lami/decompression/microdiscectomy;  metabolic bone  disease; HTN; hypothyroidism; hearing loss, recently widowed  PAIN:  Are you having pain? Yes: NPRS scale: 5-6/10 Pain location: L medial knee Pain description: ache Aggravating factors: bending knee Relieving factors: ice & heat  PRECAUTIONS: None  RED FLAGS: None   WEIGHT BEARING RESTRICTIONS: No  FALLS:  Has patient fallen in last 6 months? No  LIVING ENVIRONMENT: Lives with: lives with their son Lives in: House/apartment Stairs:  No Has following equipment at home: Single point cane, Environmental consultant - 2 wheeled, and Environmental consultant - 4 wheeled  OCCUPATION: retiredd  PLOF: Independent  PATIENT GOALS: get out and walk and play in some dirt!  Also would like to travel    OBJECTIVE:  Note: Objective measures were completed at Evaluation unless otherwise noted.  DIAGNOSTIC FINDINGS:   PATIENT SURVEYS:  LEFS 26/80  COGNITION: Overall cognitive status: Within functional limits for tasks assessed     SENSATION: Numbness L anterior shin  EDEMA:  Circumferential: 48.5 cm R knee, 52.5 cm L knee  MUSCLE LENGTH: NT  POSTURE: No Significant postural limitations  PALPATION: Incision covered, no tenderness in L calf.   LOWER EXTREMITY ROM:  Passive ROM Right eval Left eval  Knee flexion 115 90  Knee extension 0 0   (Blank rows = not tested)  LOWER EXTREMITY MMT:  MMT Right eval Left eval  Hip flexion 4+ 2+  Hip extension    Hip abduction    Hip adduction    Knee flexion 5 3  Knee extension 4+ 3  Ankle dorsiflexion 5 5  Ankle plantarflexion     (Blank rows = not tested)  FUNCTIONAL TESTS:  5 times sit to stand: 20 seconds without UE assist  GAIT: Distance walked: 50' Assistive device utilized: Environmental consultant - 2 wheeled Level of assistance: Modified independence Comments: visually slow gait speed, reports intermittent L knee buckling, without AD unsteady, antalgic, HHA needed for short distance from chair to exam table.    TODAY'S TREATMENT:                                                                                                                              DATE:   01/28/23 EVAL Therapeutic Exercise: to improve strength and mobility.  Demo, verbal and tactile cues throughout for technique. Supine Quad Set  -10 reps LLE - Supine Heel Slide with Strap  10 reps LLE - Supine Active Straight Leg Raise  10 reps LLE - Supine Hip Abduction   10 reps LLE - Seated Quad Set - demo only Vasopneumatic: Gameready  at end of session for post session soreness/edema.  10 min, low compression, 34 deg (coldest).    PATIENT EDUCATION:  Education details: findings, POC, initial HEP Person educated: Patient Education method: Explanation, Demonstration, Verbal cues, and Handouts Education comprehension: verbalized understanding and returned demonstration  HOME EXERCISE PROGRAM: Access Code: [email or web address] URL: [email or web address] Date: 01/28/2023 Prepared by: Harrie Foreman  Exercises - Supine Quad Set  - 3 x  daily - 7 x weekly - 1 sets - 10 reps - 10 hold - Supine Heel Slide with Strap  - 3 x daily - 7 x weekly - 1 sets - 10 reps - 10 hold - Supine Active Straight Leg Raise  - 3 x daily - 7 x weekly - 1 sets - 10 reps - Supine Hip Abduction  - 3 x daily - 7 x weekly - 1 sets - 10 reps - Seated Knee Flexion Extension AROM   - 3 x daily - 7 x weekly - 1 sets - 10 reps - Seated Long Arc Quad  - 3 x daily - 7 x weekly - 1 sets - 10 reps - Seated Quad Set  - 3 x daily - 7 x weekly - 1 sets - 10 reps - 5-10 sec hold  ASSESSMENT:  CLINICAL IMPRESSION: Tiffany Byrd is a 82 y.o. female who was seen today for physical therapy evaluation and treatment for s/p L TKR on 01/14/23.  Exam is typical and as expected, with significant localized edema, limited knee ROM, poor functional strength, gait and balance impairment, and poor functional activity tolerance. Tiffany Byrd will benefit from skilled PT services to address all limitations, reduce pain, and reach optimal level of function.  Today reviewed and progressed bed level exercises, followed by Vasopneumatic (GameReady) at end of session for post-session edema and soreness.     OBJECTIVE IMPAIRMENTS: Abnormal gait, decreased activity tolerance, decreased balance, decreased endurance, decreased mobility, difficulty walking, decreased ROM, decreased strength, increased edema, increased fascial restrictions, impaired sensation, and pain.   ACTIVITY  LIMITATIONS: carrying, lifting, bending, sitting, standing, squatting, sleeping, stairs, transfers, bed mobility, and locomotion level  PARTICIPATION LIMITATIONS: meal prep, cleaning, laundry, driving, shopping, community activity, and yard work  PERSONAL FACTORS: Age, Transportation, and 3+ comorbidities: R TKA; R THA, OA - R hip, L knee, ankle and foot; L hip greater trochanteric bursitis; CVA ; lumbar lami/decompression/microdiscectomy;  metabolic bone disease; HTN; hypothyroidism; hearing loss,  are also affecting patient's functional outcome.   REHAB POTENTIAL: Excellent  CLINICAL DECISION MAKING: Stable/uncomplicated  EVALUATION COMPLEXITY: Low   GOALS: Goals reviewed with patient? Yes   SHORT TERM GOALS: Target date: 02/11/2023    Independent with initial HEP. Baseline: given Goal status: INITIAL  LONG TERM GOALS: Target date: 03/11/2023   Independent with advanced/ongoing HEP to improve outcomes and carryover.  Baseline:  Goal status: INITIAL  2.  Tiffany Byrd will demonstrate L knee flexion to 115 deg to ascend/descend stairs. Baseline: R knee 115, L knee 90 Goal status: INITIAL  3.  Tiffany Byrd will be able to ambulate 600' safely without AD and normal gait pattern to access community.  Baseline: using 2WRW Goal status: INITIAL  4.  Tiffany Byrd will be able to ascend/descend stairs with 1 HR and reciprocal step pattern safely to access home and community.  Baseline: unable Goal status: INITIAL  5.  Tiffany Byrd will demonstrate > 19/24 on DGI to demonstrate decreased risk of falls.   Baseline: unable Goal status: INITIAL  6.  Tiffany Byrd will demonstrate improved functional LE strength by completing 5x STS in <14 seconds.  (MCID 5 seconds) Baseline: 20 seconds Goal status: INITIAL  7.  Tiffany Byrd will demonstrate > 40/80 on LEFS to demonstrate improved mobility.  Baseline: 26/80 Goal status: INITIAL  PLAN:  PT FREQUENCY: 2-3x/week  PT DURATION: 6  weeks  PLANNED INTERVENTIONS: 97164- PT Re-evaluation, 97110-Therapeutic exercises, 97530- Therapeutic activity, [id number]- Neuromuscular re-education, 97535- Self Care,  [id number]- Manual therapy, [id number]- Gait training, 19147- Orthotic Fit/training, 82956- Electrical stimulation (unattended), [id number]- Electrical stimulation (manual), 97016- Vasopneumatic device, Balance training, Stair training, Taping, Dry Needling, Joint mobilization, Joint manipulation, Cryotherapy, and Moist heat  PLAN FOR NEXT SESSION: review and progress HEP (standing exercises), gait and balance training, weekly ROM measurements, modalities (game ready) PRN   Jena Gauss, PT, DPT  01/28/2023, 1:27 PM   Date of referral: 12/25/22 Referring provider: Jodi Geralds, MD Referring diagnosis? Z96.659 (ICD-10-CM) - S/P TKR (total knee replacement)  Treatment diagnosis? (if different than referring diagnosis) M25.562, M25.662, M62.81, R60.0, R26.2  What was this (referring dx) caused by? Surgery (Type: total knee replacement)  Ashby Dawes of Condition: Initial Onset (within last 3 months)   Laterality: Lt  Current Functional Measure Score: LEFS 26/80 = 32.5%  Objective measurements identify impairments when they are compared to normal values, the uninvolved extremity, and prior level of function.  [x]  Yes  []  No  Objective assessment of functional ability: Severe functional limitations   Briefly describe symptoms: Significant localized edema, limited knee ROM, poor functional strength, gait and balance impairment, and poor functional activity tolerance  How did symptoms start: severe osteoarthritis, failed conserviative measures including PT and cortisone shots, required surgery  Average pain intensity:  Last 24 hours: 6/10  Past week: 6/10  How often does the pt experience symptoms? Constantly  How much have the symptoms interfered with usual daily activities? Quite a bit  How has condition changed since care began at  this facility? NA - initial visit  In general, how is the patients overall health? Good   BACK PAIN (STarT Back Screening Tool) No

## 2023-01-30 ENCOUNTER — Ambulatory Visit: Payer: Medicare Other

## 2023-01-30 DIAGNOSIS — M25562 Pain in left knee: Secondary | ICD-10-CM

## 2023-01-30 DIAGNOSIS — R262 Difficulty in walking, not elsewhere classified: Secondary | ICD-10-CM

## 2023-01-30 DIAGNOSIS — M25662 Stiffness of left knee, not elsewhere classified: Secondary | ICD-10-CM

## 2023-01-30 DIAGNOSIS — R600 Localized edema: Secondary | ICD-10-CM

## 2023-01-30 DIAGNOSIS — M6281 Muscle weakness (generalized): Secondary | ICD-10-CM

## 2023-01-30 NOTE — Therapy (Signed)
OUTPATIENT PHYSICAL THERAPY TREATMENT   Patient Name: Tiffany Byrd MRN: [id number] DOB:02-Sep-1940, 82 y.o., female Today's Date: 01/30/2023  END OF SESSION:  PT End of Session - 01/30/23 1131     Visit Number 2    Date for PT Re-Evaluation 03/11/23    Authorization Type [employer] Medicare    Progress Note Due on Visit 10    PT Start Time 1021    PT Stop Time 1113    PT Time Calculation (min) 52 min    Activity Tolerance Patient tolerated treatment well    Behavior During Therapy WFL for tasks assessed/performed              Past Medical History:  Diagnosis Date   GERD (gastroesophageal reflux disease)    Hyperlipidemia    Stroke (HCC)    Thyroid disease    Past Surgical History:  Procedure Laterality Date   ABDOMINAL HYSTERECTOMY     CHOLECYSTECTOMY     LUMBAR LAMINECTOMY/DECOMPRESSION MICRODISCECTOMY     REPLACEMENT TOTAL KNEE     Patient Active Problem List   Diagnosis Date Noted   Gait difficulty 02/03/2022   Metabolic bone disease 11/05/2021   Benign hypertension with CKD (chronic kidney disease) stage III (HCC) 07/29/2021   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 07/29/2021   Acute stroke due to ischemia (HCC) 07/14/2021   Mixed hyperlipidemia 07/14/2021   Essential hypertension 07/14/2021   GERD without esophagitis 07/14/2021   Facet arthropathy, lumbosacral 07/03/2021   Lumbar radiculopathy 07/03/2021   Chronic kidney disease, stage 3a (HCC) 04/30/2021   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   Closed fracture of left tibial plateau 04/09/2020   History of total right knee replacement 03/05/2020   Primary osteoarthritis of right hip 03/05/2020   Primary osteoarthritis of left knee 03/05/2020   PVD (posterior vitreous detachment), both eyes 08/04/2019   Acute renal insufficiency 06/22/2019   Greater trochanteric bursitis of left hip 10/06/2018   Episodic tension-type headache, not intractable  03/17/2018   Status post lumbar spine operative procedure for decompression of spinal cord 11/19/2016   Sensorineural hearing loss, bilateral 05/15/2015   Hallux valgus with bunions of right foot 04/04/2015   Hypothyroidism 04/04/2015   Other chondrocalcinosis, unspecified site 10/20/2014   Pseudogout of left knee 07/06/2014   Primary localized osteoarthrosis of ankle and foot 04/11/2008    PCP: Raynelle Jan., MD   REFERRING PROVIDER: Jodi Geralds, MD  REFERRING DIAG: [phone] (ICD-10-CM) - S/P TKR (total knee replacement)   THERAPY DIAG:  Acute pain of left knee  Stiffness of left knee, not elsewhere classified  Muscle weakness (generalized)  Localized edema  Difficulty in walking, not elsewhere classified  Rationale for Evaluation and Treatment: Rehabilitation  ONSET DATE: s/p L TKR 01/14/23  SUBJECTIVE:   SUBJECTIVE STATEMENT: Pt reports she hasn't really had pain just sore in her L knee.   NEXT MD VISIT: Feb 10, 2023 with Dr. Luiz Blare  PERTINENT HISTORY: R TKA; R THA, OA - R hip, L knee, ankle and foot; L hip greater trochanteric bursitis; CVA ; lumbar lami/decompression/microdiscectomy;  metabolic bone disease; HTN; hypothyroidism; hearing loss, recently widowed  PAIN:  Are you having pain? Yes: NPRS scale: 8/10 Pain location: L medial knee Pain description: sore Aggravating factors: bending knee Relieving factors: ice & heat  PRECAUTIONS: None  RED FLAGS: None   WEIGHT BEARING RESTRICTIONS: No  FALLS:  Has patient fallen in last 6 months? No  LIVING ENVIRONMENT: Lives with: lives with  their son Lives in: House/apartment Stairs: No Has following equipment at home: Single point cane, Environmental consultant - 2 wheeled, and [employer] - 4 wheeled  OCCUPATION: retiredd  PLOF: Independent  PATIENT GOALS: get out and walk and play in some dirt!  Also would like to travel    OBJECTIVE:  Note: Objective measures were completed at Evaluation unless otherwise  noted.  DIAGNOSTIC FINDINGS:   PATIENT SURVEYS:  LEFS 26/80  COGNITION: Overall cognitive status: Within functional limits for tasks assessed     SENSATION: Numbness L anterior shin  EDEMA:  Circumferential: 48.5 cm R knee, 52.5 cm L knee  MUSCLE LENGTH: NT  POSTURE: No Significant postural limitations  PALPATION: Incision covered, no tenderness in L calf.   LOWER EXTREMITY ROM:  Passive ROM Right eval Left eval  Knee flexion 115 90  Knee extension 0 0   (Blank rows = not tested)  LOWER EXTREMITY MMT:  MMT Right eval Left eval  Hip flexion 4+ 2+  Hip extension    Hip abduction    Hip adduction    Knee flexion 5 3  Knee extension 4+ 3  Ankle dorsiflexion 5 5  Ankle plantarflexion     (Blank rows = not tested)  FUNCTIONAL TESTS:  5 times sit to stand: 20 seconds without UE assist  GAIT: Distance walked: 50' Assistive device utilized: Environmental consultant - 2 wheeled Level of assistance: Modified independence Comments: visually slow gait speed, reports intermittent L knee buckling, without AD unsteady, antalgic, HHA needed for short distance from chair to exam table.    TODAY'S TREATMENT:                                                                                                                              DATE:  01/30/23  Therapeutic Exercise: to improve strength and mobility.  Demo, verbal and tactile cues throughout for technique. Nustep L3x69min UE/LE Seated L heel slides 10x5" Seated L LAQ 10x5" Standing hip abduction x 10 bil Standing hip extension x 10 bil Standing marching x 10 bil Supine QS pillow under knee for tactile feedback 10x5" Vasopneumatic: Gameready at end of session for post session soreness/edema.  10 min, low compression, 34 deg (coldest).   Gait Training 01/28/23 EVAL Therapeutic Exercise: to improve strength and mobility.  Demo, verbal and tactile cues throughout for technique. Supine Quad Set  -10 reps LLE - Supine Heel Slide with  Strap  10 reps LLE - Supine Active Straight Leg Raise  10 reps LLE - Supine Hip Abduction   10 reps LLE - Seated Quad Set - demo only Vasopneumatic: Gameready at end of session for post session soreness/edema.  10 min, low compression, 34 deg (coldest).    PATIENT EDUCATION:  Education details: HEP update Person educated: Patient Education method: Explanation, Demonstration, Verbal cues, and Handouts Education comprehension: verbalized understanding and returned demonstration  HOME EXERCISE PROGRAM: Access Code: [email or web address] URL: [email or web address] Date: 01/30/2023 Prepared by: Verta Ellen  Exercises - Supine Quad Set  - 3 x daily - 7 x weekly - 1 sets - 10 reps - 10 hold - Supine Heel Slide with Strap  - 3 x daily - 7 x weekly - 1 sets - 10 reps - 10 hold - Supine Active Straight Leg Raise  - 3 x daily - 7 x weekly - 1 sets - 10 reps - Supine Hip Abduction  - 3 x daily - 7 x weekly - 1 sets - 10 reps - Seated Knee Flexion Extension AROM   - 3 x daily - 7 x weekly - 1 sets - 10 reps - Seated Long Arc Quad  - 3 x daily - 7 x weekly - 1 sets - 10 reps - Seated Quad Set  - 3 x daily - 7 x weekly - 1 sets - 10 reps - 5-10 sec hold - Standing Hip Abduction with Counter Support  - 1 x daily - 7 x weekly - 3 sets - 10 reps - Standing Hip Extension with Counter Support  - 1 x daily - 7 x weekly - 3 sets - 10 reps - Standing March with Counter Support  - 1 x daily - 7 x weekly - 3 sets - 10 reps - Heel Raises with Counter Support  - 1 x daily - 7 x weekly - 3 sets - 10 reps  ASSESSMENT:  CLINICAL IMPRESSION: Pt showed a good response to treatment. Continued with gentle progression of ROM and strengthening exercises for L knee. Added standing proximal LE strengthening to HEP as well, pt did require postural cues to avoid leaning with the hip ABD and ext. Good step through gait shown with RW, although instability noted in L knee and subjective reports of L knee buckling at home.  Session ended with GR to address swelling post exercise.    OBJECTIVE IMPAIRMENTS: Abnormal gait, decreased activity tolerance, decreased balance, decreased endurance, decreased mobility, difficulty walking, decreased ROM, decreased strength, increased edema, increased fascial restrictions, impaired sensation, and pain.   ACTIVITY LIMITATIONS: carrying, lifting, bending, sitting, standing, squatting, sleeping, stairs, transfers, bed mobility, and locomotion level  PARTICIPATION LIMITATIONS: meal prep, cleaning, laundry, driving, shopping, community activity, and yard work  PERSONAL FACTORS: Age, Transportation, and 3+ comorbidities: R TKA; R THA, OA - R hip, L knee, ankle and foot; L hip greater trochanteric bursitis; CVA ; lumbar lami/decompression/microdiscectomy;  metabolic bone disease; HTN; hypothyroidism; hearing loss,  are also affecting patient's functional outcome.   REHAB POTENTIAL: Excellent  CLINICAL DECISION MAKING: Stable/uncomplicated  EVALUATION COMPLEXITY: Low   GOALS: Goals reviewed with patient? Yes   SHORT TERM GOALS: Target date: 02/11/2023    Independent with initial HEP. Baseline: given Goal status: IN PROGRESS  LONG TERM GOALS: Target date: 03/11/2023   Independent with advanced/ongoing HEP to improve outcomes and carryover.  Baseline:  Goal status: IN PROGRESS  2.  Kelseyann Toot will demonstrate L knee flexion to 115 deg to ascend/descend stairs. Baseline: R knee 115, L knee 90 Goal status: IN PROGRESS  3.  Makari Siek will be able to ambulate 600' safely without AD and normal gait pattern to access community.  Baseline: using 2WRW Goal status: IN PROGRESS  4.  Jodelle Mcquaide will be able to ascend/descend stairs with 1 HR and reciprocal step pattern safely to access home and community.  Baseline: unable Goal status: IN PROGRESS  5.  Bayley Dexheimer will demonstrate > 19/24 on DGI to demonstrate decreased risk of falls.   Baseline:  unable Goal  status: IN PROGRESS  6.  Latrinity Onstott will demonstrate improved functional LE strength by completing 5x STS in <14 seconds.  (MCID 5 seconds) Baseline: 20 seconds Goal status: IN PROGRESS  7.  Shamel Lauwers will demonstrate > 40/80 on LEFS to demonstrate improved mobility.  Baseline: 26/80 Goal status: IN PROGRESS  PLAN:  PT FREQUENCY: 2-3x/week  PT DURATION: 6 weeks  PLANNED INTERVENTIONS: 97164- PT Re-evaluation, 97110-Therapeutic exercises, 97530- Therapeutic activity, 97112- Neuromuscular re-education, 97535- Self Care, 81191- Manual therapy, [id number]- Gait training, [phone]- Orthotic Fit/training, 97014- Electrical stimulation (unattended), [phone]- Electrical stimulation (manual), 97016- Vasopneumatic device, Balance training, Stair training, Taping, Dry Needling, Joint mobilization, Joint manipulation, Cryotherapy, and Moist heat  PLAN FOR NEXT SESSION: review and progress HEP (standing exercises), gait and balance training, weekly ROM measurements, modalities (game ready) PRN   Darleene Cleaver, PTA 01/30/2023, 11:39 AM   Date of referral: 12/25/22 Referring provider: Jodi Geralds, MD Referring diagnosis? Z96.659 (ICD-10-CM) - S/P TKR (total knee replacement)  Treatment diagnosis? (if different than referring diagnosis) M25.562, M25.662, M62.81, R60.0, R26.2  What was this (referring dx) caused by? Surgery (Type: total knee replacement)  Ashby Dawes of Condition: Initial Onset (within last 3 months)   Laterality: Lt  Current Functional Measure Score: LEFS 26/80 = 32.5%  Objective measurements identify impairments when they are compared to normal values, the uninvolved extremity, and prior level of function.  [x]  Yes  []  No  Objective assessment of functional ability: Severe functional limitations   Briefly describe symptoms: Significant localized edema, limited knee ROM, poor functional strength, gait and balance impairment, and poor functional activity tolerance  How did  symptoms start: severe osteoarthritis, failed conserviative measures including PT and cortisone shots, required surgery  Average pain intensity:  Last 24 hours: 6/10  Past week: 6/10  How often does the pt experience symptoms? Constantly  How much have the symptoms interfered with usual daily activities? Quite a bit  How has condition changed since care began at this facility? NA - initial visit  In general, how is the patients overall health? Good   BACK PAIN (STarT Back Screening Tool) No

## 2023-02-02 ENCOUNTER — Encounter: Payer: Self-pay | Admitting: Physical Therapy

## 2023-02-02 ENCOUNTER — Ambulatory Visit: Payer: Medicare Other | Admitting: Physical Therapy

## 2023-02-02 DIAGNOSIS — M25562 Pain in left knee: Secondary | ICD-10-CM

## 2023-02-02 DIAGNOSIS — R600 Localized edema: Secondary | ICD-10-CM

## 2023-02-02 DIAGNOSIS — M25662 Stiffness of left knee, not elsewhere classified: Secondary | ICD-10-CM

## 2023-02-02 DIAGNOSIS — M6281 Muscle weakness (generalized): Secondary | ICD-10-CM

## 2023-02-02 DIAGNOSIS — R262 Difficulty in walking, not elsewhere classified: Secondary | ICD-10-CM

## 2023-02-02 NOTE — Therapy (Signed)
OUTPATIENT PHYSICAL THERAPY TREATMENT   Patient Name: Tiffany Byrd MRN: [id number] DOB:[birth year], 82 y.o., female Today's Date: 02/02/2023  END OF SESSION:  PT End of Session - 02/02/23 1018     Visit Number 3    Date for PT Re-Evaluation 03/11/23    Authorization Type [employer]    Progress Note Due on Visit 10    PT Start Time 1018    Activity Tolerance Patient tolerated treatment well    Behavior During Therapy WFL for tasks assessed/performed              Past Medical History:  Diagnosis Date   GERD (gastroesophageal reflux disease)    Hyperlipidemia    Stroke (HCC)    Thyroid disease    Past Surgical History:  Procedure Laterality Date   ABDOMINAL HYSTERECTOMY     CHOLECYSTECTOMY     LUMBAR LAMINECTOMY/DECOMPRESSION MICRODISCECTOMY     REPLACEMENT TOTAL KNEE     Patient Active Problem List   Diagnosis Date Noted   Gait difficulty 02/03/2022   Metabolic bone disease 11/05/2021   Benign hypertension with CKD (chronic kidney disease) stage III (HCC) 07/29/2021   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 07/29/2021   Acute stroke due to ischemia (HCC) 07/14/2021   Mixed hyperlipidemia 07/14/2021   Essential hypertension 07/14/2021   GERD without esophagitis 07/14/2021   Facet arthropathy, lumbosacral 07/03/2021   Lumbar radiculopathy 07/03/2021   Chronic kidney disease, stage 3a (HCC) 04/30/2021   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   Closed fracture of left tibial plateau 04/09/2020   History of total right knee replacement 03/05/2020   Primary osteoarthritis of right hip 03/05/2020   Primary osteoarthritis of left knee 03/05/2020   PVD (posterior vitreous detachment), both eyes 08/04/2019   Acute renal insufficiency 06/22/2019   Greater trochanteric bursitis of left hip 10/06/2018   Episodic tension-type headache, not intractable 03/17/2018   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 11/19/2016   Sensorineural hearing loss, bilateral 05/15/2015   Hallux valgus with bunions of right foot 04/04/2015   Hypothyroidism 04/04/2015   Other chondrocalcinosis, unspecified site 10/20/2014   Pseudogout of left knee 07/06/2014   Primary localized osteoarthrosis of ankle and foot 04/11/2008    PCP: Raynelle Jan., MD   REFERRING PROVIDER: Jodi Geralds, MD  REFERRING DIAG: [phone] (ICD-10-CM) - S/P TKR (total knee replacement)   THERAPY DIAG:  Acute pain of left knee  Stiffness of left knee, not elsewhere classified  Muscle weakness (generalized)  Localized edema  Difficulty in walking, not elsewhere classified  Rationale for Evaluation and Treatment: Rehabilitation  ONSET DATE: s/p L TKR 01/14/23  SUBJECTIVE:   SUBJECTIVE STATEMENT: Pt reports a lot of swelling and tightness in her knee.  Back has been bothering her more Saturday, better after heating pad, so didn't do as many side steps.     NEXT MD VISIT: Feb 10, 2023 with Dr. Luiz Blare  PERTINENT HISTORY: R TKA; R THA, OA - R hip, L knee, ankle and foot; L hip greater trochanteric bursitis; CVA ; lumbar lami/decompression/microdiscectomy;  metabolic bone disease; HTN; hypothyroidism; hearing loss, recently widowed  PAIN:  Are you having pain? Yes: NPRS scale: 7-8/10 Pain location: L medial knee Pain description: tight Aggravating factors: bending knee Relieving factors: ice & heat  PRECAUTIONS: None  RED FLAGS: None   WEIGHT BEARING RESTRICTIONS: No  FALLS:  Has patient fallen in last 6 months? No  LIVING ENVIRONMENT: Lives  with: lives with their son Lives in: House/apartment Stairs: No Has following equipment at home: Single point cane, Environmental consultant - 2 wheeled, and Environmental consultant - 4 wheeled  OCCUPATION: retiredd  PLOF: Independent  PATIENT GOALS: get out and walk and play in some dirt!  Also would like to travel    OBJECTIVE:  Note: Objective measures were completed at  Evaluation unless otherwise noted.  DIAGNOSTIC FINDINGS:   PATIENT SURVEYS:  LEFS 26/80  COGNITION: Overall cognitive status: Within functional limits for tasks assessed     SENSATION: Numbness L anterior shin  EDEMA:  Circumferential: 48.5 cm R knee, 52.5 cm L knee  MUSCLE LENGTH: NT  POSTURE: No Significant postural limitations  PALPATION: Incision covered, no tenderness in L calf.   LOWER EXTREMITY ROM:  Passive ROM Right eval Left eval Left 02/02/23  Knee flexion 115 90 96  Knee extension 0 0    (Blank rows = not tested)  LOWER EXTREMITY MMT:  MMT Right eval Left eval  Hip flexion 4+ 2+  Hip extension    Hip abduction    Hip adduction    Knee flexion 5 3  Knee extension 4+ 3  Ankle dorsiflexion 5 5  Ankle plantarflexion     (Blank rows = not tested)  FUNCTIONAL TESTS:  5 times sit to stand: 20 seconds without UE assist  GAIT: Distance walked: 50' Assistive device utilized: Environmental consultant - 2 wheeled Level of assistance: Modified independence Comments: visually slow gait speed, reports intermittent L knee buckling, without AD unsteady, antalgic, HHA needed for short distance from chair to exam table.    TODAY'S TREATMENT:                                                                                                                              DATE:   02/02/23 Therapeutic Exercise: to improve strength and mobility.  Demo, verbal and tactile cues throughout for technique. Nustep L5 x 6 min UE/LE Gait x 300' with 2WRW, cues for heel strike Standing heel raises x 20  Standing HS curl x 10  Standing hip extension 2 x 10 R/L Standing hip abduction 2 x 10 R/L Supine heel slides x 10  Manual Therapy: to decrease muscle spasm and pain and improve mobility Manual edema resorption techniques to LLE to decrease edema and pain, from thigh to ankle.  Modalities: MHP to R quad in supine (leg straight) x 10 min to decrease pain at end of session (not included in  treatment time).  01/30/23  Therapeutic Exercise: to improve strength and mobility.  Demo, verbal and tactile cues throughout for technique. Nustep L3x54min UE/LE Seated L heel slides 10x5" Seated L LAQ 10x5" Standing hip abduction x 10 bil Standing hip extension x 10 bil Standing marching x 10 bil Supine QS pillow under knee for tactile feedback 10x5" Vasopneumatic: Gameready at end of session for post session soreness/edema.  10 min, low compression, 34 deg (coldest).   Gait  Training 01/28/23 EVAL Therapeutic Exercise: to improve strength and mobility.  Demo, verbal and tactile cues throughout for technique. Supine Quad Set  -10 reps LLE - Supine Heel Slide with Strap  10 reps LLE - Supine Active Straight Leg Raise  10 reps LLE - Supine Hip Abduction   10 reps LLE - Seated Quad Set - demo only Vasopneumatic: Gameready at end of session for post session soreness/edema.  10 min, low compression, 34 deg (coldest).    PATIENT EDUCATION:  Education details: HEP review Person educated: Patient Education method: Programmer, multimedia, Facilities manager, Verbal cues, and Handouts Education comprehension: verbalized understanding and returned demonstration  HOME EXERCISE PROGRAM: Access Code: [email or web address] URL: [email or web address] Date: 01/30/2023 Prepared by: Verta Ellen  Exercises - Supine Quad Set  - 3 x daily - 7 x weekly - 1 sets - 10 reps - 10 hold - Supine Heel Slide with Strap  - 3 x daily - 7 x weekly - 1 sets - 10 reps - 10 hold - Supine Active Straight Leg Raise  - 3 x daily - 7 x weekly - 1 sets - 10 reps - Supine Hip Abduction  - 3 x daily - 7 x weekly - 1 sets - 10 reps - Seated Knee Flexion Extension AROM   - 3 x daily - 7 x weekly - 1 sets - 10 reps - Seated Long Arc Quad  - 3 x daily - 7 x weekly - 1 sets - 10 reps - Seated Quad Set  - 3 x daily - 7 x weekly - 1 sets - 10 reps - 5-10 sec hold - Standing Hip Abduction with Counter Support  - 1 x daily - 7 x weekly - 3  sets - 10 reps - Standing Hip Extension with Counter Support  - 1 x daily - 7 x weekly - 3 sets - 10 reps - Standing March with Counter Support  - 1 x daily - 7 x weekly - 3 sets - 10 reps - Heel Raises with Counter Support  - 1 x daily - 7 x weekly - 3 sets - 10 reps  ASSESSMENT:  CLINICAL IMPRESSION: Okie Kaska reports good compliance with exercises, reporting more tightness than pain.  Still feels like L knee wants to buckle so recommended continuing with walker, however she is walking faster with good heel strike and TKE and no signs of instability.  Today reviewed standing exercises, performing on both sides to encourage L quad activation during stance, she did need seated rest break but otherwise tolerated well.  Noted edema throughout LLE especially in L ankle, this is where she reports most pain, reported decreased pain after manual edema resporption techniques.  She is finding heat more helpful than cold for pain, so applied MHP to thigh, but cautioned her if she did note more swelling to switch back to cold.  Winslow Prindle continues to demonstrate potential for improvement and would benefit from continued skilled therapy to address impairments.        OBJECTIVE IMPAIRMENTS: Abnormal gait, decreased activity tolerance, decreased balance, decreased endurance, decreased mobility, difficulty walking, decreased ROM, decreased strength, increased edema, increased fascial restrictions, impaired sensation, and pain.   ACTIVITY LIMITATIONS: carrying, lifting, bending, sitting, standing, squatting, sleeping, stairs, transfers, bed mobility, and locomotion level  PARTICIPATION LIMITATIONS: meal prep, cleaning, laundry, driving, shopping, community activity, and yard work  PERSONAL FACTORS: Age, Transportation, and 3+ comorbidities: R TKA; R THA, OA - R hip, L knee, ankle and foot; L hip greater  trochanteric bursitis; CVA ; lumbar lami/decompression/microdiscectomy;  metabolic bone disease; HTN;  hypothyroidism; hearing loss,  are also affecting patient's functional outcome.   REHAB POTENTIAL: Excellent  CLINICAL DECISION MAKING: Stable/uncomplicated  EVALUATION COMPLEXITY: Low   GOALS: Goals reviewed with patient? Yes   SHORT TERM GOALS: Target date: 02/11/2023    Independent with initial HEP. Baseline: given Goal status: MET 02/02/23  LONG TERM GOALS: Target date: 03/11/2023   Independent with advanced/ongoing HEP to improve outcomes and carryover.  Baseline:  Goal status: IN PROGRESS  2.  Georgeana Rindal will demonstrate L knee flexion to 115 deg to ascend/descend stairs. Baseline: R knee 115, L knee 90 Goal status: IN PROGRESS  3.  Danija Sedberry will be able to ambulate 600' safely without AD and normal gait pattern to access community.  Baseline: using 2WRW Goal status: IN PROGRESS  4.  Nazareth Morado will be able to ascend/descend stairs with 1 HR and reciprocal step pattern safely to access home and community.  Baseline: unable Goal status: IN PROGRESS  5.  Shanaye Schuldt will demonstrate > 19/24 on DGI to demonstrate decreased risk of falls.   Baseline: unable Goal status: IN PROGRESS  6.  Leshia Rog will demonstrate improved functional LE strength by completing 5x STS in <14 seconds.  (MCID 5 seconds) Baseline: 20 seconds Goal status: IN PROGRESS  7.  Chesnee Boche will demonstrate > 40/80 on LEFS to demonstrate improved mobility.  Baseline: 26/80 Goal status: IN PROGRESS  PLAN:  PT FREQUENCY: 2-3x/week  PT DURATION: 6 weeks  PLANNED INTERVENTIONS: 97164- PT Re-evaluation, 97110-Therapeutic exercises, 97530- Therapeutic activity, 97112- Neuromuscular re-education, 97535- Self Care, 78295- Manual therapy, [phone]- Gait training, [phone]- Orthotic Fit/training, 97014- Electrical stimulation (unattended), [phone]- Electrical stimulation (manual), 97016- Vasopneumatic device, Balance training, Stair training, Taping, Dry Needling, Joint mobilization, Joint  manipulation, Cryotherapy, and Moist heat  PLAN FOR NEXT SESSION: review and progress HEP (standing exercises), gait and balance training, weekly ROM measurements, modalities (game ready) PRN   Jena Gauss, PT 02/02/2023, 10:21 AM   Date of referral: 12/25/22 Referring provider: Jodi Geralds, MD Referring diagnosis? Z96.659 (ICD-10-CM) - S/P TKR (total knee replacement)  Treatment diagnosis? (if different than referring diagnosis) M25.562, M25.662, M62.81, R60.0, R26.2

## 2023-02-04 ENCOUNTER — Ambulatory Visit: Payer: Medicare Other

## 2023-02-04 DIAGNOSIS — M25562 Pain in left knee: Secondary | ICD-10-CM | POA: Diagnosis not present

## 2023-02-04 DIAGNOSIS — M6281 Muscle weakness (generalized): Secondary | ICD-10-CM

## 2023-02-04 DIAGNOSIS — M25662 Stiffness of left knee, not elsewhere classified: Secondary | ICD-10-CM

## 2023-02-04 DIAGNOSIS — R600 Localized edema: Secondary | ICD-10-CM

## 2023-02-04 DIAGNOSIS — R262 Difficulty in walking, not elsewhere classified: Secondary | ICD-10-CM

## 2023-02-04 NOTE — Therapy (Signed)
OUTPATIENT PHYSICAL THERAPY TREATMENT   Patient Name: Tiffany Byrd MRN: [id number] DOB:Nov 05, 1940, 82 y.o., female Today's Date: 02/04/2023  END OF SESSION:  PT End of Session - 02/04/23 1041     Visit Number 4    Date for PT Re-Evaluation 03/11/23    Authorization Type [employer]    Progress Note Due on Visit 10    PT Start Time 1020    PT Stop Time 1108    PT Time Calculation (min) 48 min    Activity Tolerance Patient tolerated treatment well    Behavior During Therapy WFL for tasks assessed/performed               Past Medical History:  Diagnosis Date   GERD (gastroesophageal reflux disease)    Hyperlipidemia    Stroke (HCC)    Thyroid disease    Past Surgical History:  Procedure Laterality Date   ABDOMINAL HYSTERECTOMY     CHOLECYSTECTOMY     LUMBAR LAMINECTOMY/DECOMPRESSION MICRODISCECTOMY     REPLACEMENT TOTAL KNEE     Patient Active Problem List   Diagnosis Date Noted   Gait difficulty 02/03/2022   Metabolic bone disease 11/05/2021   Benign hypertension with CKD (chronic kidney disease) stage III (HCC) 07/29/2021   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 07/29/2021   Acute stroke due to ischemia (HCC) 07/14/2021   Mixed hyperlipidemia 07/14/2021   Essential hypertension 07/14/2021   GERD without esophagitis 07/14/2021   Facet arthropathy, lumbosacral 07/03/2021   Lumbar radiculopathy 07/03/2021   Chronic kidney disease, stage 3a (HCC) 04/30/2021   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   Closed fracture of left tibial plateau 04/09/2020   History of total right knee replacement 03/05/2020   Primary osteoarthritis of right hip 03/05/2020   Primary osteoarthritis of left knee 03/05/2020   PVD (posterior vitreous detachment), both eyes 08/04/2019   Acute renal insufficiency 06/22/2019   Greater trochanteric bursitis of left hip 10/06/2018   Episodic tension-type headache, not intractable  03/17/2018   Status post lumbar spine operative procedure for decompression of spinal cord 11/19/2016   Sensorineural hearing loss, bilateral 05/15/2015   Hallux valgus with bunions of right foot 04/04/2015   Hypothyroidism 04/04/2015   Other chondrocalcinosis, unspecified site 10/20/2014   Pseudogout of left knee 07/06/2014   Primary localized osteoarthrosis of ankle and foot 04/11/2008    PCP: Raynelle Jan., MD   REFERRING PROVIDER: Jodi Geralds, MD  REFERRING DIAG: [phone] (ICD-10-CM) - S/P TKR (total knee replacement)   THERAPY DIAG:  Acute pain of left knee  Stiffness of left knee, not elsewhere classified  Muscle weakness (generalized)  Localized edema  Difficulty in walking, not elsewhere classified  Rationale for Evaluation and Treatment: Rehabilitation  ONSET DATE: s/p L TKR 01/14/23  SUBJECTIVE:   SUBJECTIVE STATEMENT: Pt reports pain on inside of her L knee.    NEXT MD VISIT: Feb 10, 2023 with Dr. Luiz Blare  PERTINENT HISTORY: R TKA; R THA, OA - R hip, L knee, ankle and foot; L hip greater trochanteric bursitis; CVA ; lumbar lami/decompression/microdiscectomy;  metabolic bone disease; HTN; hypothyroidism; hearing loss, recently widowed  PAIN:  Are you having pain? Yes: NPRS scale: 7/10 Pain location: L medial knee Pain description: tight Aggravating factors: bending knee Relieving factors: ice & heat  PRECAUTIONS: None  RED FLAGS: None   WEIGHT BEARING RESTRICTIONS: No  FALLS:  Has patient fallen in last 6 months? No  LIVING ENVIRONMENT: Lives with: lives with their son  Lives in: House/apartment Stairs: No Has following equipment at home: Single point cane, Walker - 2 wheeled, and [employer] - 4 wheeled  OCCUPATION: retiredd  PLOF: Independent  PATIENT GOALS: get out and walk and play in some dirt!  Also would like to travel    OBJECTIVE:  Note: Objective measures were completed at Evaluation unless otherwise noted.  DIAGNOSTIC  FINDINGS:   PATIENT SURVEYS:  LEFS 26/80  COGNITION: Overall cognitive status: Within functional limits for tasks assessed     SENSATION: Numbness L anterior shin  EDEMA:  Circumferential: 48.5 cm R knee, 52.5 cm L knee  MUSCLE LENGTH: NT  POSTURE: No Significant postural limitations  PALPATION: Incision covered, no tenderness in L calf.   LOWER EXTREMITY ROM:  Passive ROM Right eval Left eval Left 02/02/23  Knee flexion 115 90 96  Knee extension 0 0    (Blank rows = not tested)  LOWER EXTREMITY MMT:  MMT Right eval Left eval  Hip flexion 4+ 2+  Hip extension    Hip abduction    Hip adduction    Knee flexion 5 3  Knee extension 4+ 3  Ankle dorsiflexion 5 5  Ankle plantarflexion     (Blank rows = not tested)  FUNCTIONAL TESTS:  5 times sit to stand: 20 seconds without UE assist  GAIT: Distance walked: 50' Assistive device utilized: Environmental consultant - 2 wheeled Level of assistance: Modified independence Comments: visually slow gait speed, reports intermittent L knee buckling, without AD unsteady, antalgic, HHA needed for short distance from chair to exam table.    TODAY'S TREATMENT:                                                                                                                              DATE:  02/04/23 Therapeutic Exercise: to improve strength and mobility.  Demo, verbal and tactile cues throughout for technique. Nustep L5 x 6 min UE/LE Standing L lunge/knee flexion stretch 10x3" Seated L heel slides AROM x 10; AAROM with RLE 2x10 Retro step with arm raise 1HA 2x10 Standing L TKE with towel behind thigh at counter 10x5" Manual Therapy: to decrease muscle spasm and pain and improve mobility Gentle L knee PROM end range flexion hanging in hip and knee flexion 02/02/23 Therapeutic Exercise: to improve strength and mobility.  Demo, verbal and tactile cues throughout for technique. Nustep L5 x 6 min UE/LE Gait x 300' with 2WRW, cues for heel  strike Standing heel raises x 20  Standing HS curl x 10  Standing hip extension 2 x 10 R/L Standing hip abduction 2 x 10 R/L Supine heel slides x 10  Manual Therapy: to decrease muscle spasm and pain and improve mobility Manual edema resorption techniques to LLE to decrease edema and pain, from thigh to ankle.  Modalities: MHP to R quad in supine (leg straight) x 10 min to decrease pain at end of session (not included in treatment time).  01/30/23  Therapeutic Exercise: to improve strength and mobility.  Demo, verbal and tactile cues throughout for technique. Nustep L3x100min UE/LE Seated L heel slides 10x5" Seated L LAQ 10x5" Standing hip abduction x 10 bil Standing hip extension x 10 bil Standing marching x 10 bil Supine QS pillow under knee for tactile feedback 10x5" Vasopneumatic: Gameready at end of session for post session soreness/edema.  10 min, low compression, 34 deg (coldest).   Gait Training 01/28/23 EVAL Therapeutic Exercise: to improve strength and mobility.  Demo, verbal and tactile cues throughout for technique. Supine Quad Set  -10 reps LLE - Supine Heel Slide with Strap  10 reps LLE - Supine Active Straight Leg Raise  10 reps LLE - Supine Hip Abduction   10 reps LLE - Seated Quad Set - demo only Vasopneumatic: Gameready at end of session for post session soreness/edema.  10 min, low compression, 34 deg (coldest).    PATIENT EDUCATION:  Education details: HEP review Person educated: Patient Education method: Programmer, multimedia, Facilities manager, Verbal cues, and Handouts Education comprehension: verbalized understanding and returned demonstration  HOME EXERCISE PROGRAM: Access Code: [email or web address] URL: [email or web address] Date: 02/04/2023 Prepared by: Verta Ellen  Exercises - Supine Quad Set  - 3 x daily - 7 x weekly - 1 sets - 10 reps - 10 hold - Supine Heel Slide with Strap  - 3 x daily - 7 x weekly - 1 sets - 10 reps - 10 hold - Supine Active Straight  Leg Raise  - 3 x daily - 7 x weekly - 1 sets - 10 reps - Supine Hip Abduction  - 3 x daily - 7 x weekly - 1 sets - 10 reps - Seated Knee Flexion Extension AROM   - 3 x daily - 7 x weekly - 1 sets - 10 reps - Seated Long Arc Quad  - 3 x daily - 7 x weekly - 1 sets - 10 reps - Seated Quad Set  - 3 x daily - 7 x weekly - 1 sets - 10 reps - 5-10 sec hold - Standing Hip Abduction with Counter Support  - 1 x daily - 7 x weekly - 3 sets - 10 reps - Standing Hip Extension with Counter Support  - 1 x daily - 7 x weekly - 3 sets - 10 reps - Standing March with Counter Support  - 1 x daily - 7 x weekly - 3 sets - 10 reps - Heel Raises with Counter Support  - 1 x daily - 7 x weekly - 3 sets - 10 reps - Backward Weight Shift and Opposite Arm Raise with Walker  - 1 x daily - 7 x weekly - 2 sets - 10 reps - Terminal Knee Extension with Ball and Counter  - 1 x daily - 7 x weekly - 2 sets - 10 reps  ASSESSMENT:  CLINICAL IMPRESSION: Focus of treatment today went toward quad facilitation in weight bearing to reduce frequency of knee buckling. Gentle manual stretching for knee flexion followed exercises to improve ROM. Pt requested ice pack post session to L knee for pain and swelling. She continues to benefit from skilled therapy to improve strength to improve stability and wean to LRAD. Sudha Malaney continues to demonstrate potential for improvement and would benefit from continued skilled therapy to address impairments.        OBJECTIVE IMPAIRMENTS: Abnormal gait, decreased activity tolerance, decreased balance, decreased endurance, decreased mobility, difficulty walking, decreased ROM, decreased strength, increased edema, increased fascial restrictions, impaired sensation,  and pain.   ACTIVITY LIMITATIONS: carrying, lifting, bending, sitting, standing, squatting, sleeping, stairs, transfers, bed mobility, and locomotion level  PARTICIPATION LIMITATIONS: meal prep, cleaning, laundry, driving, shopping,  community activity, and yard work  PERSONAL FACTORS: Age, Transportation, and 3+ comorbidities: R TKA; R THA, OA - R hip, L knee, ankle and foot; L hip greater trochanteric bursitis; CVA ; lumbar lami/decompression/microdiscectomy;  metabolic bone disease; HTN; hypothyroidism; hearing loss,  are also affecting patient's functional outcome.   REHAB POTENTIAL: Excellent  CLINICAL DECISION MAKING: Stable/uncomplicated  EVALUATION COMPLEXITY: Low   GOALS: Goals reviewed with patient? Yes   SHORT TERM GOALS: Target date: 02/11/2023    Independent with initial HEP. Baseline: given Goal status: MET 02/02/23  LONG TERM GOALS: Target date: 03/11/2023   Independent with advanced/ongoing HEP to improve outcomes and carryover.  Baseline:  Goal status: IN PROGRESS  2.  Megnan Earnhardt will demonstrate L knee flexion to 115 deg to ascend/descend stairs. Baseline: R knee 115, L knee 90 Goal status: IN PROGRESS  3.  Blayre Buchko will be able to ambulate 600' safely without AD and normal gait pattern to access community.  Baseline: using 2WRW Goal status: IN PROGRESS  4.  Addlynn Gaitan will be able to ascend/descend stairs with 1 HR and reciprocal step pattern safely to access home and community.  Baseline: unable Goal status: IN PROGRESS  5.  Meliza Bierce will demonstrate > 19/24 on DGI to demonstrate decreased risk of falls.   Baseline: unable Goal status: IN PROGRESS  6.  Martrice Jablonowski will demonstrate improved functional LE strength by completing 5x STS in <14 seconds.  (MCID 5 seconds) Baseline: 20 seconds Goal status: IN PROGRESS  7.  Ahmarie Un will demonstrate > 40/80 on LEFS to demonstrate improved mobility.  Baseline: 26/80 Goal status: IN PROGRESS  PLAN:  PT FREQUENCY: 2-3x/week  PT DURATION: 6 weeks  PLANNED INTERVENTIONS: 97164- PT Re-evaluation, 97110-Therapeutic exercises, 97530- Therapeutic activity, 97112- Neuromuscular re-education, 97535- Self Care, 82956- Manual  therapy, [id number]- Gait training, [phone]- Orthotic Fit/training, 97014- Electrical stimulation (unattended), [phone]- Electrical stimulation (manual), 97016- Vasopneumatic device, Balance training, Stair training, Taping, Dry Needling, Joint mobilization, Joint manipulation, Cryotherapy, and Moist heat  PLAN FOR NEXT SESSION: review and progress HEP (standing exercises), gait and balance training, weekly ROM measurements, modalities (game ready) PRN   Darleene Cleaver, PTA 02/04/2023, 11:26 AM   Date of referral: 12/25/22 Referring provider: Jodi Geralds, MD Referring diagnosis? Z96.659 (ICD-10-CM) - S/P TKR (total knee replacement)  Treatment diagnosis? (if different than referring diagnosis) M25.562, M25.662, M62.81, R60.0, R26.2

## 2023-02-05 ENCOUNTER — Encounter: Payer: Medicare Other | Admitting: Physical Therapy

## 2023-02-06 ENCOUNTER — Encounter: Payer: Medicare Other | Admitting: Physical Therapy

## 2023-02-09 ENCOUNTER — Ambulatory Visit: Payer: Medicare Other

## 2023-02-09 DIAGNOSIS — M6281 Muscle weakness (generalized): Secondary | ICD-10-CM

## 2023-02-09 DIAGNOSIS — M25562 Pain in left knee: Secondary | ICD-10-CM

## 2023-02-09 DIAGNOSIS — R262 Difficulty in walking, not elsewhere classified: Secondary | ICD-10-CM

## 2023-02-09 DIAGNOSIS — M25662 Stiffness of left knee, not elsewhere classified: Secondary | ICD-10-CM

## 2023-02-09 DIAGNOSIS — R600 Localized edema: Secondary | ICD-10-CM

## 2023-02-09 NOTE — Therapy (Signed)
OUTPATIENT PHYSICAL THERAPY TREATMENT   Patient Name: Tiffany Byrd MRN: [id number] DOB:11-15-1940, 82 y.o., female Today's Date: 02/09/2023  END OF SESSION:  PT End of Session - 02/09/23 1029     Visit Number 5    Date for PT Re-Evaluation 03/11/23    Authorization Type [employer]    Progress Note Due on Visit 10    PT Start Time 1015    PT Stop Time 1058    PT Time Calculation (min) 43 min    Activity Tolerance Patient tolerated treatment well    Behavior During Therapy WFL for tasks assessed/performed                Past Medical History:  Diagnosis Date   GERD (gastroesophageal reflux disease)    Hyperlipidemia    Stroke (HCC)    Thyroid disease    Past Surgical History:  Procedure Laterality Date   ABDOMINAL HYSTERECTOMY     CHOLECYSTECTOMY     LUMBAR LAMINECTOMY/DECOMPRESSION MICRODISCECTOMY     REPLACEMENT TOTAL KNEE     Patient Active Problem List   Diagnosis Date Noted   Gait difficulty 02/03/2022   Metabolic bone disease 11/05/2021   Benign hypertension with CKD (chronic kidney disease) stage III (HCC) 07/29/2021   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 07/29/2021   Acute stroke due to ischemia (HCC) 07/14/2021   Mixed hyperlipidemia 07/14/2021   Essential hypertension 07/14/2021   GERD without esophagitis 07/14/2021   Facet arthropathy, lumbosacral 07/03/2021   Lumbar radiculopathy 07/03/2021   Chronic kidney disease, stage 3a (HCC) 04/30/2021   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   Closed fracture of left tibial plateau 04/09/2020   History of total right knee replacement 03/05/2020   Primary osteoarthritis of right hip 03/05/2020   Primary osteoarthritis of left knee 03/05/2020   PVD (posterior vitreous detachment), both eyes 08/04/2019   Acute renal insufficiency 06/22/2019   Greater trochanteric bursitis of left hip 10/06/2018   Episodic tension-type headache, not intractable  03/17/2018   Status post lumbar spine operative procedure for decompression of spinal cord 11/19/2016   Sensorineural hearing loss, bilateral 05/15/2015   Hallux valgus with bunions of right foot 04/04/2015   Hypothyroidism 04/04/2015   Other chondrocalcinosis, unspecified site 10/20/2014   Pseudogout of left knee 07/06/2014   Primary localized osteoarthrosis of ankle and foot 04/11/2008    PCP: Raynelle Jan., MD   REFERRING PROVIDER: Jodi Geralds, MD  REFERRING DIAG: [phone] (ICD-10-CM) - S/P TKR (total knee replacement)   THERAPY DIAG:  Acute pain of left knee  Stiffness of left knee, not elsewhere classified  Muscle weakness (generalized)  Localized edema  Difficulty in walking, not elsewhere classified  Rationale for Evaluation and Treatment: Rehabilitation  ONSET DATE: s/p L TKR 01/14/23  SUBJECTIVE:   SUBJECTIVE STATEMENT: Pt reports that her back and hip were bothersome over the weekend.   NEXT MD VISIT: Feb 10, 2023 with Dr. Luiz Blare  PERTINENT HISTORY: R TKA; R THA, OA - R hip, L knee, ankle and foot; L hip greater trochanteric bursitis; CVA ; lumbar lami/decompression/microdiscectomy;  metabolic bone disease; HTN; hypothyroidism; hearing loss, recently widowed  PAIN:  Are you having pain? Yes: NPRS scale: 6/10 Pain location: L medial knee Pain description: tight Aggravating factors: bending knee Relieving factors: ice & heat  PRECAUTIONS: None  RED FLAGS: None   WEIGHT BEARING RESTRICTIONS: No  FALLS:  Has patient fallen in last 6 months? No  LIVING ENVIRONMENT: Lives with: lives  with their son Lives in: House/apartment Stairs: No Has following equipment at home: Single point cane, Environmental consultant - 2 wheeled, and [employer] - 4 wheeled  OCCUPATION: retiredd  PLOF: Independent  PATIENT GOALS: get out and walk and play in some dirt!  Also would like to travel    OBJECTIVE:  Note: Objective measures were completed at Evaluation unless otherwise  noted.  DIAGNOSTIC FINDINGS:   PATIENT SURVEYS:  LEFS 26/80  COGNITION: Overall cognitive status: Within functional limits for tasks assessed     SENSATION: Numbness L anterior shin  EDEMA:  Circumferential: 48.5 cm R knee, 52.5 cm L knee  MUSCLE LENGTH: NT  POSTURE: No Significant postural limitations  PALPATION: Incision covered, no tenderness in L calf.   LOWER EXTREMITY ROM:  Passive ROM Right eval Left eval Left 02/02/23 Left 02/09/23  Knee flexion 115 90 96 104  Knee extension 0 0   0 supported   (Blank rows = not tested)  LOWER EXTREMITY MMT:  MMT Right eval Left eval  Hip flexion 4+ 2+  Hip extension    Hip abduction    Hip adduction    Knee flexion 5 3  Knee extension 4+ 3  Ankle dorsiflexion 5 5  Ankle plantarflexion     (Blank rows = not tested)  FUNCTIONAL TESTS:  5 times sit to stand: 20 seconds without UE assist  GAIT: Distance walked: 50' Assistive device utilized: Environmental consultant - 2 wheeled Level of assistance: Modified independence Comments: visually slow gait speed, reports intermittent L knee buckling, without AD unsteady, antalgic, HHA needed for short distance from chair to exam table.    TODAY'S TREATMENT:                                                                                                                              DATE:  02/09/23 Therapeutic Exercise: to improve strength and mobility.  Demo, verbal and tactile cues throughout for technique. Nustep L5 x 6 min UE/LE Standing L TKE back to counter with towel 2x10 - 5 sec hold Retro step with arm raise 1HA 2x10 Mini squats at counter 20x10; 3 seconds Seated heel slides AAROM with RLE  PROM to L kne into end range flexion  Measured knee AROM  02/04/23 Therapeutic Exercise: to improve strength and mobility.  Demo, verbal and tactile cues throughout for technique. Nustep L5 x 6 min UE/LE Standing L lunge/knee flexion stretch 10x3" Seated L heel slides AROM x 10; AAROM  with RLE 2x10 Retro step with arm raise 1HA 2x10 Standing L TKE with towel behind thigh at counter 10x5" Manual Therapy: to decrease muscle spasm and pain and improve mobility Gentle L knee PROM end range flexion hanging in hip and knee flexion 02/02/23 Therapeutic Exercise: to improve strength and mobility.  Demo, verbal and tactile cues throughout for technique. Nustep L5 x 6 min UE/LE Gait x 300' with 2WRW, cues for heel strike Standing heel raises x 20  Standing  HS curl x 10  Standing hip extension 2 x 10 R/L Standing hip abduction 2 x 10 R/L Supine heel slides x 10  Manual Therapy: to decrease muscle spasm and pain and improve mobility Manual edema resorption techniques to LLE to decrease edema and pain, from thigh to ankle.  Modalities: MHP to R quad in supine (leg straight) x 10 min to decrease pain at end of session (not included in treatment time).  01/30/23  Therapeutic Exercise: to improve strength and mobility.  Demo, verbal and tactile cues throughout for technique. Nustep L3x74min UE/LE Seated L heel slides 10x5" Seated L LAQ 10x5" Standing hip abduction x 10 bil Standing hip extension x 10 bil Standing marching x 10 bil Supine QS pillow under knee for tactile feedback 10x5" Vasopneumatic: Gameready at end of session for post session soreness/edema.  10 min, low compression, 34 deg (coldest).   Gait Training 01/28/23 EVAL Therapeutic Exercise: to improve strength and mobility.  Demo, verbal and tactile cues throughout for technique. Supine Quad Set  -10 reps LLE - Supine Heel Slide with Strap  10 reps LLE - Supine Active Straight Leg Raise  10 reps LLE - Supine Hip Abduction   10 reps LLE - Seated Quad Set - demo only Vasopneumatic: Gameready at end of session for post session soreness/edema.  10 min, low compression, 34 deg (coldest).    PATIENT EDUCATION:  Education details: HEP review Person educated: Patient Education method: Programmer, multimedia, Facilities manager, Verbal  cues, and Handouts Education comprehension: verbalized understanding and returned demonstration  HOME EXERCISE PROGRAM: Access Code: [email or web address] URL: [email or web address] Date: 02/04/2023 Prepared by: Verta Ellen  Exercises - Supine Quad Set  - 3 x daily - 7 x weekly - 1 sets - 10 reps - 10 hold - Supine Heel Slide with Strap  - 3 x daily - 7 x weekly - 1 sets - 10 reps - 10 hold - Supine Active Straight Leg Raise  - 3 x daily - 7 x weekly - 1 sets - 10 reps - Supine Hip Abduction  - 3 x daily - 7 x weekly - 1 sets - 10 reps - Seated Knee Flexion Extension AROM   - 3 x daily - 7 x weekly - 1 sets - 10 reps - Seated Long Arc Quad  - 3 x daily - 7 x weekly - 1 sets - 10 reps - Seated Quad Set  - 3 x daily - 7 x weekly - 1 sets - 10 reps - 5-10 sec hold - Standing Hip Abduction with Counter Support  - 1 x daily - 7 x weekly - 3 sets - 10 reps - Standing Hip Extension with Counter Support  - 1 x daily - 7 x weekly - 3 sets - 10 reps - Standing March with Counter Support  - 1 x daily - 7 x weekly - 3 sets - 10 reps - Heel Raises with Counter Support  - 1 x daily - 7 x weekly - 3 sets - 10 reps - Backward Weight Shift and Opposite Arm Raise with Walker  - 1 x daily - 7 x weekly - 2 sets - 10 reps - Terminal Knee Extension with Ball and Counter  - 1 x daily - 7 x weekly - 2 sets - 10 reps  ASSESSMENT:  CLINICAL IMPRESSION: Continued focus on quad facilitation in weight bearing to reduce frequency of knee buckling. Cues required with retro step for full WS on L LE. Cues to avoid knees over  toes with the mini squats. Knee flexion has improved to 104 deg today, her biggest limitation is quad weakness causing knee buckling.  Tkeyah Razzaq continues to demonstrate potential for improvement and would benefit from continued skilled therapy to address impairments.        OBJECTIVE IMPAIRMENTS: Abnormal gait, decreased activity tolerance, decreased balance, decreased endurance, decreased  mobility, difficulty walking, decreased ROM, decreased strength, increased edema, increased fascial restrictions, impaired sensation, and pain.   ACTIVITY LIMITATIONS: carrying, lifting, bending, sitting, standing, squatting, sleeping, stairs, transfers, bed mobility, and locomotion level  PARTICIPATION LIMITATIONS: meal prep, cleaning, laundry, driving, shopping, community activity, and yard work  PERSONAL FACTORS: Age, Transportation, and 3+ comorbidities: R TKA; R THA, OA - R hip, L knee, ankle and foot; L hip greater trochanteric bursitis; CVA ; lumbar lami/decompression/microdiscectomy;  metabolic bone disease; HTN; hypothyroidism; hearing loss,  are also affecting patient's functional outcome.   REHAB POTENTIAL: Excellent  CLINICAL DECISION MAKING: Stable/uncomplicated  EVALUATION COMPLEXITY: Low   GOALS: Goals reviewed with patient? Yes   SHORT TERM GOALS: Target date: 02/11/2023    Independent with initial HEP. Baseline: given Goal status: MET 02/02/23  LONG TERM GOALS: Target date: 03/11/2023   Independent with advanced/ongoing HEP to improve outcomes and carryover.  Baseline:  Goal status: IN PROGRESS  2.  Keora Leinonen will demonstrate L knee flexion to 115 deg to ascend/descend stairs. Baseline: R knee 115, L knee 90 Goal status: IN PROGRESS- 02/09/23- 104 deg  3.  Almerinda Hache will be able to ambulate 600' safely without AD and normal gait pattern to access community.  Baseline: using 2WRW Goal status: IN PROGRESS  4.  Jahmiah Peek will be able to ascend/descend stairs with 1 HR and reciprocal step pattern safely to access home and community.  Baseline: unable Goal status: IN PROGRESS  5.  Neida Vanness will demonstrate > 19/24 on DGI to demonstrate decreased risk of falls.   Baseline: unable Goal status: IN PROGRESS  6.  Rylyn Twomey will demonstrate improved functional LE strength by completing 5x STS in <14 seconds.  (MCID 5 seconds) Baseline: 20  seconds Goal status: IN PROGRESS  7.  Shateara Tippie will demonstrate > 40/80 on LEFS to demonstrate improved mobility.  Baseline: 26/80 Goal status: IN PROGRESS  PLAN:  PT FREQUENCY: 2-3x/week  PT DURATION: 6 weeks  PLANNED INTERVENTIONS: 97164- PT Re-evaluation, 97110-Therapeutic exercises, 97530- Therapeutic activity, 97112- Neuromuscular re-education, 97535- Self Care, 16109- Manual therapy, [phone]- Gait training, [phone]- Orthotic Fit/training, 97014- Electrical stimulation (unattended), [phone]- Electrical stimulation (manual), 97016- Vasopneumatic device, Balance training, Stair training, Taping, Dry Needling, Joint mobilization, Joint manipulation, Cryotherapy, and Moist heat  PLAN FOR NEXT SESSION: quad strengthening; gait and balance training, weekly ROM measurements, modalities (game ready) PRN   Darleene Cleaver, PTA 02/09/2023, 10:58 AM   Date of referral: 12/25/22 Referring provider: Jodi Geralds, MD Referring diagnosis? Z96.659 (ICD-10-CM) - S/P TKR (total knee replacement)  Treatment diagnosis? (if different than referring diagnosis) M25.562, M25.662, M62.81, R60.0, R26.2

## 2023-02-11 ENCOUNTER — Ambulatory Visit: Payer: Medicare Other | Admitting: Physical Therapy

## 2023-02-11 ENCOUNTER — Encounter: Payer: Self-pay | Admitting: Physical Therapy

## 2023-02-11 DIAGNOSIS — M25662 Stiffness of left knee, not elsewhere classified: Secondary | ICD-10-CM

## 2023-02-11 DIAGNOSIS — M25562 Pain in left knee: Secondary | ICD-10-CM

## 2023-02-11 DIAGNOSIS — M6281 Muscle weakness (generalized): Secondary | ICD-10-CM

## 2023-02-11 DIAGNOSIS — R600 Localized edema: Secondary | ICD-10-CM

## 2023-02-11 DIAGNOSIS — R262 Difficulty in walking, not elsewhere classified: Secondary | ICD-10-CM

## 2023-02-11 NOTE — Therapy (Signed)
OUTPATIENT PHYSICAL THERAPY TREATMENT   Patient Name: Tiffany Byrd MRN: [id number] DOB:[birth year], 82 y.o., female Today's Date: 02/11/2023  END OF SESSION:  PT End of Session - 02/11/23 0957     Visit Number 6    Date for PT Re-Evaluation 03/11/23    Authorization Type [employer]    Progress Note Due on Visit 10    PT Start Time [phone]    PT Stop Time 1050    PT Time Calculation (min) 55 min    Activity Tolerance Patient tolerated treatment well    Behavior During Therapy WFL for tasks assessed/performed                Past Medical History:  Diagnosis Date   GERD (gastroesophageal reflux disease)    Hyperlipidemia    Stroke (HCC)    Thyroid disease    Past Surgical History:  Procedure Laterality Date   ABDOMINAL HYSTERECTOMY     CHOLECYSTECTOMY     LUMBAR LAMINECTOMY/DECOMPRESSION MICRODISCECTOMY     REPLACEMENT TOTAL KNEE     Patient Active Problem List   Diagnosis Date Noted   Gait difficulty 02/03/2022   Metabolic bone disease 11/05/2021   Benign hypertension with CKD (chronic kidney disease) stage III (HCC) 07/29/2021   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 07/29/2021   Acute stroke due to ischemia (HCC) 07/14/2021   Mixed hyperlipidemia 07/14/2021   Essential hypertension 07/14/2021   GERD without esophagitis 07/14/2021   Facet arthropathy, lumbosacral 07/03/2021   Lumbar radiculopathy 07/03/2021   Chronic kidney disease, stage 3a (HCC) 04/30/2021   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   Closed fracture of left tibial plateau 04/09/2020   History of total right knee replacement 03/05/2020   Primary osteoarthritis of right hip 03/05/2020   Primary osteoarthritis of left knee 03/05/2020   PVD (posterior vitreous detachment), both eyes 08/04/2019   Acute renal insufficiency 06/22/2019   Greater trochanteric bursitis of left hip 10/06/2018   Episodic tension-type headache, not intractable  03/17/2018   Status post lumbar spine operative procedure for decompression of spinal cord 11/19/2016   Sensorineural hearing loss, bilateral 05/15/2015   Hallux valgus with bunions of right foot 04/04/2015   Hypothyroidism 04/04/2015   Other chondrocalcinosis, unspecified site 10/20/2014   Pseudogout of left knee 07/06/2014   Primary localized osteoarthrosis of ankle and foot 04/11/2008    PCP: Raynelle Jan., MD   REFERRING PROVIDER: Jodi Geralds, MD  REFERRING DIAG: [phone] (ICD-10-CM) - S/P TKR (total knee replacement)   THERAPY DIAG:  Acute pain of left knee  Stiffness of left knee, not elsewhere classified  Muscle weakness (generalized)  Localized edema  Difficulty in walking, not elsewhere classified  Rationale for Evaluation and Treatment: Rehabilitation  ONSET DATE: s/p L TKR 01/14/23  SUBJECTIVE:   SUBJECTIVE STATEMENT: Was able to get out and walk and got to a store yesterday with her son.  Used the walker though.   Pain not so bad. Had follow-up yesterday with Dr. Luiz Blare, reported he was pleased.   NEXT MD VISIT: February 2024 with Dr. Luiz Blare  PERTINENT HISTORY: R TKA; R THA, OA - R hip, L knee, ankle and foot; L hip greater trochanteric bursitis; CVA ; lumbar lami/decompression/microdiscectomy;  metabolic bone disease; HTN; hypothyroidism; hearing loss, recently widowed  PAIN:  Are you having pain? Yes: NPRS scale: 6/10 Pain location: L medial knee Pain description: tight Aggravating factors: bending knee Relieving factors: ice & heat  PRECAUTIONS: None  RED FLAGS:  None   WEIGHT BEARING RESTRICTIONS: No  FALLS:  Has patient fallen in last 6 months? No  LIVING ENVIRONMENT: Lives with: lives with their son Lives in: House/apartment Stairs: No Has following equipment at home: Single point cane, Environmental consultant - 2 wheeled, and Environmental consultant - 4 wheeled  OCCUPATION: retiredd  PLOF: Independent  PATIENT GOALS: get out and walk and play in some dirt!  Also  would like to travel    OBJECTIVE:  Note: Objective measures were completed at Evaluation unless otherwise noted.  DIAGNOSTIC FINDINGS:   PATIENT SURVEYS:  LEFS 26/80  COGNITION: Overall cognitive status: Within functional limits for tasks assessed     SENSATION: Numbness L anterior shin  EDEMA:  Circumferential: 48.5 cm R knee, 52.5 cm L knee  MUSCLE LENGTH: NT  POSTURE: No Significant postural limitations  PALPATION: Incision covered, no tenderness in L calf.   LOWER EXTREMITY ROM:  Passive ROM Right eval Left eval Left 02/02/23 Left 02/09/23  Knee flexion 115 90 96 104  Knee extension 0 0   0 supported   (Blank rows = not tested)  LOWER EXTREMITY MMT:  MMT Right eval Left eval  Hip flexion 4+ 2+  Hip extension    Hip abduction    Hip adduction    Knee flexion 5 3  Knee extension 4+ 3  Ankle dorsiflexion 5 5  Ankle plantarflexion     (Blank rows = not tested)  FUNCTIONAL TESTS:  5 times sit to stand: 20 seconds without UE assist  GAIT: Distance walked: 50' Assistive device utilized: Environmental consultant - 2 wheeled Level of assistance: Modified independence Comments: visually slow gait speed, reports intermittent L knee buckling, without AD unsteady, antalgic, HHA needed for short distance from chair to exam table.    TODAY'S TREATMENT:                                                                                                                              DATE:   02/11/23 Therapeutic Exercise: to improve strength and mobility.  Demo, verbal and tactile cues throughout for technique. Nustep L6 x 7 min UE/LE Knee extension 10# bil x 10, x 10 with bil concentric, LLE eccentric, 5# LLE only x 10 Knee flexion 10# bil x 10, x 10 with bil concentric, LLE eccentric, 5# LLE only x 10 Prone knee bends x 10  Gait: with SPC x 250' with SBA for safety, cues for heel strike. Can adjusted for height, placed in R hand, demo proper form.  Manual Therapy: to decrease  muscle spasm and pain and improve mobility PROM - in prone - poorly tolerated, reported medial knee pain IASTM to quads, rectus femoris and VMO, STM to MCL Cross friction massage to scar Modalities: MHP to L knee x 10 min (end of session, not included in treatment time)   02/09/23 Therapeutic Exercise: to improve strength and mobility.  Demo, verbal and tactile cues throughout for technique. Nustep L5 x  6 min UE/LE Standing L TKE back to counter with towel 2x10 - 5 sec hold Retro step with arm raise 1HA 2x10 Mini squats at counter 20x10; 3 seconds Seated heel slides AAROM with RLE  PROM to L knee into end range flexion  Measured knee AROM  02/04/23 Therapeutic Exercise: to improve strength and mobility.  Demo, verbal and tactile cues throughout for technique. Nustep L5 x 6 min UE/LE Standing L lunge/knee flexion stretch 10x3" Seated L heel slides AROM x 10; AAROM with RLE 2x10 Retro step with arm raise 1HA 2x10 Standing L TKE with towel behind thigh at counter 10x5" Manual Therapy: to decrease muscle spasm and pain and improve mobility Gentle L knee PROM end range flexion hanging in hip and knee flexion 02/02/23 Therapeutic Exercise: to improve strength and mobility.  Demo, verbal and tactile cues throughout for technique. Nustep L5 x 6 min UE/LE Gait x 300' with 2WRW, cues for heel strike Standing heel raises x 20  Standing HS curl x 10  Standing hip extension 2 x 10 R/L Standing hip abduction 2 x 10 R/L Supine heel slides x 10  Manual Therapy: to decrease muscle spasm and pain and improve mobility Manual edema resorption techniques to LLE to decrease edema and pain, from thigh to ankle.  Modalities: MHP to R quad in supine (leg straight) x 10 min to decrease pain at end of session (not included in treatment time).     PATIENT EDUCATION:  Education details: HEP review Person educated: Patient Education method: Programmer, multimedia, Facilities manager, Verbal cues, and Handouts Education  comprehension: verbalized understanding and returned demonstration  HOME EXERCISE PROGRAM: Access Code: [email or web address] URL: [email or web address] Date: 02/04/2023 Prepared by: Verta Ellen  Exercises - Supine Quad Set  - 3 x daily - 7 x weekly - 1 sets - 10 reps - 10 hold - Supine Heel Slide with Strap  - 3 x daily - 7 x weekly - 1 sets - 10 reps - 10 hold - Supine Active Straight Leg Raise  - 3 x daily - 7 x weekly - 1 sets - 10 reps - Supine Hip Abduction  - 3 x daily - 7 x weekly - 1 sets - 10 reps - Seated Knee Flexion Extension AROM   - 3 x daily - 7 x weekly - 1 sets - 10 reps - Seated Long Arc Quad  - 3 x daily - 7 x weekly - 1 sets - 10 reps - Seated Quad Set  - 3 x daily - 7 x weekly - 1 sets - 10 reps - 5-10 sec hold - Standing Hip Abduction with Counter Support  - 1 x daily - 7 x weekly - 3 sets - 10 reps - Standing Hip Extension with Counter Support  - 1 x daily - 7 x weekly - 3 sets - 10 reps - Standing March with Counter Support  - 1 x daily - 7 x weekly - 3 sets - 10 reps - Heel Raises with Counter Support  - 1 x daily - 7 x weekly - 3 sets - 10 reps - Backward Weight Shift and Opposite Arm Raise with Walker  - 1 x daily - 7 x weekly - 2 sets - 10 reps - Terminal Knee Extension with Ball and Counter  - 1 x daily - 7 x weekly - 2 sets - 10 reps  ASSESSMENT:  CLINICAL IMPRESSION: Kessie Smither is making good progress.  Today progressed strength training with resistance training, tolerated very well  with minimal fatigue.  She was also able to progress to [facility] x 250 feet today, no buckling in L knee but did report fatigue afterwards.  Attempted quad stretch in prone to improve ROM, however poorly tolerated, she reports pain is more in medial knee around MCL/pes anserine than in quads/incision.  Scar is still hypertrophic, reviewed scar tissue mobilization.  Durenda Larosa continues to demonstrate potential for improvement and would benefit from continued skilled therapy to  address impairments.        OBJECTIVE IMPAIRMENTS: Abnormal gait, decreased activity tolerance, decreased balance, decreased endurance, decreased mobility, difficulty walking, decreased ROM, decreased strength, increased edema, increased fascial restrictions, impaired sensation, and pain.   ACTIVITY LIMITATIONS: carrying, lifting, bending, sitting, standing, squatting, sleeping, stairs, transfers, bed mobility, and locomotion level  PARTICIPATION LIMITATIONS: meal prep, cleaning, laundry, driving, shopping, community activity, and yard work  PERSONAL FACTORS: Age, Transportation, and 3+ comorbidities: R TKA; R THA, OA - R hip, L knee, ankle and foot; L hip greater trochanteric bursitis; CVA ; lumbar lami/decompression/microdiscectomy;  metabolic bone disease; HTN; hypothyroidism; hearing loss,  are also affecting patient's functional outcome.   REHAB POTENTIAL: Excellent  CLINICAL DECISION MAKING: Stable/uncomplicated  EVALUATION COMPLEXITY: Low   GOALS: Goals reviewed with patient? Yes   SHORT TERM GOALS: Target date: 02/11/2023    Independent with initial HEP. Baseline: given Goal status: MET 02/02/23  LONG TERM GOALS: Target date: 03/11/2023   Independent with advanced/ongoing HEP to improve outcomes and carryover.  Baseline:  Goal status: IN PROGRESS  2.  Bret Otterstrom will demonstrate L knee flexion to 115 deg to ascend/descend stairs. Baseline: R knee 115, L knee 90 Goal status: IN PROGRESS- 02/09/23- 104 deg  3.  Jamora Hartline will be able to ambulate 600' safely without AD and normal gait pattern to access community.  Baseline: using 2WRW Goal status: IN PROGRESS 02/11/23 - 250' with SPC, SBA for safety.   4.  Minetta Kephart will be able to ascend/descend stairs with 1 HR and reciprocal step pattern safely to access home and community.  Baseline: unable Goal status: IN PROGRESS  5.  Lashaye Petruzzi will demonstrate > 19/24 on DGI to demonstrate decreased risk of falls.    Baseline: unable Goal status: IN PROGRESS   6.  Chealsy Harvath will demonstrate improved functional LE strength by completing 5x STS in <14 seconds.  (MCID 5 seconds) Baseline: 20 seconds Goal status: IN PROGRESS  7.  Norvell Rake will demonstrate > 40/80 on LEFS to demonstrate improved mobility.  Baseline: 26/80 Goal status: IN PROGRESS  PLAN:  PT FREQUENCY: 2-3x/week  PT DURATION: 6 weeks  PLANNED INTERVENTIONS: 97164- PT Re-evaluation, 97110-Therapeutic exercises, 97530- Therapeutic activity, 97112- Neuromuscular re-education, 97535- Self Care, 16109- Manual therapy, [phone]- Gait training, [phone]- Orthotic Fit/training, 97014- Electrical stimulation (unattended), [phone]- Electrical stimulation (manual), 97016- Vasopneumatic device, Balance training, Stair training, Taping, Dry Needling, Joint mobilization, Joint manipulation, Cryotherapy, and Moist heat  PLAN FOR NEXT SESSION: Start bike and stairs: quad strengthening; gait and balance training, weekly ROM measurements, modalities  PRN   Jena Gauss, PT 02/11/2023, 11:08 AM   Date of referral: 12/25/22 Referring provider: Jodi Geralds, MD Referring diagnosis? Z96.659 (ICD-10-CM) - S/P TKR (total knee replacement)  Treatment diagnosis? (if different than referring diagnosis) M25.562, M25.662, M62.81, R60.0, R26.2

## 2023-02-13 ENCOUNTER — Ambulatory Visit: Payer: Medicare Other

## 2023-02-13 DIAGNOSIS — R262 Difficulty in walking, not elsewhere classified: Secondary | ICD-10-CM

## 2023-02-13 DIAGNOSIS — M25562 Pain in left knee: Secondary | ICD-10-CM | POA: Diagnosis not present

## 2023-02-13 DIAGNOSIS — R600 Localized edema: Secondary | ICD-10-CM

## 2023-02-13 DIAGNOSIS — M25662 Stiffness of left knee, not elsewhere classified: Secondary | ICD-10-CM

## 2023-02-13 DIAGNOSIS — M6281 Muscle weakness (generalized): Secondary | ICD-10-CM

## 2023-02-13 NOTE — Therapy (Signed)
OUTPATIENT PHYSICAL THERAPY TREATMENT   Patient Name: Tiffany Byrd MRN: [id number] DOB:08-24-1940, 82 y.o., female Today's Date: 02/13/2023  END OF SESSION:       Past Medical History:  Diagnosis Date   GERD (gastroesophageal reflux disease)    Hyperlipidemia    Stroke [facility])    Thyroid disease    Past Surgical History:  Procedure Laterality Date   ABDOMINAL HYSTERECTOMY     CHOLECYSTECTOMY     LUMBAR LAMINECTOMY/DECOMPRESSION MICRODISCECTOMY     REPLACEMENT TOTAL KNEE     Patient Active Problem List   Diagnosis Date Noted   Gait difficulty 02/03/2022   Metabolic bone disease 11/05/2021   Benign hypertension with CKD (chronic kidney disease) stage III (HCC) 07/29/2021   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 07/29/2021   Acute stroke due to ischemia (HCC) 07/14/2021   Mixed hyperlipidemia 07/14/2021   Essential hypertension 07/14/2021   GERD without esophagitis 07/14/2021   Facet arthropathy, lumbosacral 07/03/2021   Lumbar radiculopathy 07/03/2021   Chronic kidney disease, stage 3a (HCC) 04/30/2021   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   Closed fracture of left tibial plateau 04/09/2020   History of total right knee replacement 03/05/2020   Primary osteoarthritis of right hip 03/05/2020   Primary osteoarthritis of left knee 03/05/2020   PVD (posterior vitreous detachment), both eyes 08/04/2019   Acute renal insufficiency 06/22/2019   Greater trochanteric bursitis of left hip 10/06/2018   Episodic tension-type headache, not intractable 03/17/2018   Status post lumbar spine operative procedure for decompression of spinal cord 11/19/2016   Sensorineural hearing loss, bilateral 05/15/2015   Hallux valgus with bunions of right foot 04/04/2015   Hypothyroidism 04/04/2015   Other chondrocalcinosis, unspecified site 10/20/2014   Pseudogout of left knee 07/06/2014   Primary localized osteoarthrosis of ankle  and foot 04/11/2008    PCP: Raynelle Jan., MD   REFERRING PROVIDER: Jodi Geralds, MD  REFERRING DIAG: [phone] (ICD-10-CM) - S/P TKR (total knee replacement)   THERAPY DIAG:  Acute pain of left knee  Stiffness of left knee, not elsewhere classified  Muscle weakness (generalized)  Localized edema  Difficulty in walking, not elsewhere classified  Rationale for Evaluation and Treatment: Rehabilitation  ONSET DATE: s/p L TKR 01/14/23  SUBJECTIVE:   SUBJECTIVE STATEMENT: Pt reports still having pain 10/10 this morning. She did not sleep well d/t the medial portion of her knee bothering her  NEXT MD VISIT: February 2024 with Dr. Luiz Blare  PERTINENT HISTORY: R TKA; R THA, OA - R hip, L knee, ankle and foot; L hip greater trochanteric bursitis; CVA ; lumbar lami/decompression/microdiscectomy;  metabolic bone disease; HTN; hypothyroidism; hearing loss, recently widowed  PAIN:  Are you having pain? Yes: NPRS scale: 6/10 Pain location: L medial knee Pain description: tight Aggravating factors: bending knee Relieving factors: ice & heat  PRECAUTIONS: None  RED FLAGS: None   WEIGHT BEARING RESTRICTIONS: No  FALLS:  Has patient fallen in last 6 months? No  LIVING ENVIRONMENT: Lives with: lives with their son Lives in: House/apartment Stairs: No Has following equipment at home: Single point cane, Environmental consultant - 2 wheeled, and Environmental consultant - 4 wheeled  OCCUPATION: retiredd  PLOF: Independent  PATIENT GOALS: get out and walk and play in some dirt!  Also would like to travel    OBJECTIVE:  Note: Objective measures were completed at Evaluation unless otherwise noted.  DIAGNOSTIC FINDINGS:   PATIENT SURVEYS:  LEFS 26/80  COGNITION: Overall cognitive status: Within functional limits  for tasks assessed     SENSATION: Numbness L anterior shin  EDEMA:  Circumferential: 48.5 cm R knee, 52.5 cm L knee  MUSCLE LENGTH: NT  POSTURE: No Significant postural  limitations  PALPATION: Incision covered, no tenderness in L calf.   LOWER EXTREMITY ROM:  Passive ROM Right eval Left eval Left 02/02/23 Left 02/09/23  Knee flexion 115 90 96 104  Knee extension 0 0   0 supported   (Blank rows = not tested)  LOWER EXTREMITY MMT:  MMT Right eval Left eval  Hip flexion 4+ 2+  Hip extension    Hip abduction    Hip adduction    Knee flexion 5 3  Knee extension 4+ 3  Ankle dorsiflexion 5 5  Ankle plantarflexion     (Blank rows = not tested)  FUNCTIONAL TESTS:  5 times sit to stand: 20 seconds without UE assist  GAIT: Distance walked: 50' Assistive device utilized: Environmental consultant - 2 wheeled Level of assistance: Modified independence Comments: visually slow gait speed, reports intermittent L knee buckling, without AD unsteady, antalgic, HHA needed for short distance from chair to exam table.    TODAY'S TREATMENT:                                                                                                                              DATE:  02/13/23 Therapeutic Exercise: to improve strength and mobility.  Demo, verbal and tactile cues throughout for technique. Bike partial revolutions x 5 min Knee flexion 10# x 10 BLE; x 10 B con/L ecc Knee extension 5# x 10 BLE; x 10 B con/L ecc  Manual Therapy: to decrease muscle spasm and pain and improve mobility STM to R pes anserine, distal hip adductors, VMO scar mobilization  L manual hip adductor stretch in supine  Gait Training: with SPC x 250' with SBA for safety  02/11/23 Therapeutic Exercise: to improve strength and mobility.  Demo, verbal and tactile cues throughout for technique. Nustep L6 x 7 min UE/LE Knee extension 10# bil x 10, x 10 with bil concentric, LLE eccentric, 5# LLE only x 10 Knee flexion 10# bil x 10, x 10 with bil concentric, LLE eccentric, 5# LLE only x 10 Prone knee bends x 10  Gait: with SPC x 250' with SBA for safety, cues for heel strike. Can adjusted for height,  placed in R hand, demo proper form.  Manual Therapy: to decrease muscle spasm and pain and improve mobility PROM - in prone - poorly tolerated, reported medial knee pain IASTM to quads, rectus femoris and VMO, STM to MCL Cross friction massage to scar Modalities: MHP to L knee x 10 min (end of session, not included in treatment time)   02/09/23 Therapeutic Exercise: to improve strength and mobility.  Demo, verbal and tactile cues throughout for technique. Nustep L5 x 6 min UE/LE Standing L TKE back to counter with towel 2x10 - 5 sec hold Retro step with  arm raise 1HA 2x10 Mini squats at counter 20x10; 3 seconds Seated heel slides AAROM with RLE  PROM to L knee into end range flexion  Measured knee AROM  02/04/23 Therapeutic Exercise: to improve strength and mobility.  Demo, verbal and tactile cues throughout for technique. Nustep L5 x 6 min UE/LE Standing L lunge/knee flexion stretch 10x3" Seated L heel slides AROM x 10; AAROM with RLE 2x10 Retro step with arm raise 1HA 2x10 Standing L TKE with towel behind thigh at counter 10x5" Manual Therapy: to decrease muscle spasm and pain and improve mobility Gentle L knee PROM end range flexion hanging in hip and knee flexion 02/02/23 Therapeutic Exercise: to improve strength and mobility.  Demo, verbal and tactile cues throughout for technique. Nustep L5 x 6 min UE/LE Gait x 300' with 2WRW, cues for heel strike Standing heel raises x 20  Standing HS curl x 10  Standing hip extension 2 x 10 R/L Standing hip abduction 2 x 10 R/L Supine heel slides x 10  Manual Therapy: to decrease muscle spasm and pain and improve mobility Manual edema resorption techniques to LLE to decrease edema and pain, from thigh to ankle.  Modalities: MHP to R quad in supine (leg straight) x 10 min to decrease pain at end of session (not included in treatment time).     PATIENT EDUCATION:  Education details: HEP review Person educated: Patient Education method:  Programmer, multimedia, Facilities manager, Verbal cues, and Handouts Education comprehension: verbalized understanding and returned demonstration  HOME EXERCISE PROGRAM: Access Code: [email or web address] URL: [email or web address] Date: 02/04/2023 Prepared by: Verta Ellen  Exercises - Supine Quad Set  - 3 x daily - 7 x weekly - 1 sets - 10 reps - 10 hold - Supine Heel Slide with Strap  - 3 x daily - 7 x weekly - 1 sets - 10 reps - 10 hold - Supine Active Straight Leg Raise  - 3 x daily - 7 x weekly - 1 sets - 10 reps - Supine Hip Abduction  - 3 x daily - 7 x weekly - 1 sets - 10 reps - Seated Knee Flexion Extension AROM   - 3 x daily - 7 x weekly - 1 sets - 10 reps - Seated Long Arc Quad  - 3 x daily - 7 x weekly - 1 sets - 10 reps - Seated Quad Set  - 3 x daily - 7 x weekly - 1 sets - 10 reps - 5-10 sec hold - Standing Hip Abduction with Counter Support  - 1 x daily - 7 x weekly - 3 sets - 10 reps - Standing Hip Extension with Counter Support  - 1 x daily - 7 x weekly - 3 sets - 10 reps - Standing March with Counter Support  - 1 x daily - 7 x weekly - 3 sets - 10 reps - Heel Raises with Counter Support  - 1 x daily - 7 x weekly - 3 sets - 10 reps - Backward Weight Shift and Opposite Arm Raise with Walker  - 1 x daily - 7 x weekly - 2 sets - 10 reps - Terminal Knee Extension with Ball and Counter  - 1 x daily - 7 x weekly - 2 sets - 10 reps  ASSESSMENT:  CLINICAL IMPRESSION: Mrs. Quinlan shows to be improving to weaning from RW to [facility]. She did have increased pain again along her medial knee, where the pes anserine lies. She was very tight and TTP along her hip  adductors, VMO, and pes anserine. Continued with strengthening on weight machines. She seems to ready to start walking at home with her cane but continue using walker for longer distances. Good response to treatment, will continue to monitor and progress as able. Shelma Millwood continues to demonstrate potential for improvement and would benefit  from continued skilled therapy to address impairments.        OBJECTIVE IMPAIRMENTS: Abnormal gait, decreased activity tolerance, decreased balance, decreased endurance, decreased mobility, difficulty walking, decreased ROM, decreased strength, increased edema, increased fascial restrictions, impaired sensation, and pain.   ACTIVITY LIMITATIONS: carrying, lifting, bending, sitting, standing, squatting, sleeping, stairs, transfers, bed mobility, and locomotion level  PARTICIPATION LIMITATIONS: meal prep, cleaning, laundry, driving, shopping, community activity, and yard work  PERSONAL FACTORS: Age, Transportation, and 3+ comorbidities: R TKA; R THA, OA - R hip, L knee, ankle and foot; L hip greater trochanteric bursitis; CVA ; lumbar lami/decompression/microdiscectomy;  metabolic bone disease; HTN; hypothyroidism; hearing loss,  are also affecting patient's functional outcome.   REHAB POTENTIAL: Excellent  CLINICAL DECISION MAKING: Stable/uncomplicated  EVALUATION COMPLEXITY: Low   GOALS: Goals reviewed with patient? Yes   SHORT TERM GOALS: Target date: 02/11/2023    Independent with initial HEP. Baseline: given Goal status: MET 02/02/23  LONG TERM GOALS: Target date: 03/11/2023   Independent with advanced/ongoing HEP to improve outcomes and carryover.  Baseline:  Goal status: IN PROGRESS  2.  Kiana Nicole will demonstrate L knee flexion to 115 deg to ascend/descend stairs. Baseline: R knee 115, L knee 90 Goal status: IN PROGRESS- 02/09/23- 104 deg  3.  Sherlonda Gallman will be able to ambulate 600' safely without AD and normal gait pattern to access community.  Baseline: using 2WRW Goal status: IN PROGRESS 02/11/23 - 250' with SPC, SBA for safety.   4.  Lajuan Frable will be able to ascend/descend stairs with 1 HR and reciprocal step pattern safely to access home and community.  Baseline: unable Goal status: IN PROGRESS  5.  Tayra Steil will demonstrate > 19/24 on DGI to  demonstrate decreased risk of falls.   Baseline: unable Goal status: IN PROGRESS   6.  Jasmyn Kelsall will demonstrate improved functional LE strength by completing 5x STS in <14 seconds.  (MCID 5 seconds) Baseline: 20 seconds Goal status: IN PROGRESS  7.  Emireth Safrit will demonstrate > 40/80 on LEFS to demonstrate improved mobility.  Baseline: 26/80 Goal status: IN PROGRESS  PLAN:  PT FREQUENCY: 2-3x/week  PT DURATION: 6 weeks  PLANNED INTERVENTIONS: 97164- PT Re-evaluation, 97110-Therapeutic exercises, 97530- Therapeutic activity, 97112- Neuromuscular re-education, 97535- Self Care, 16109- Manual therapy, [phone]- Gait training, [phone]- Orthotic Fit/training, 97014- Electrical stimulation (unattended), [phone]- Electrical stimulation (manual), 97016- Vasopneumatic device, Balance training, Stair training, Taping, Dry Needling, Joint mobilization, Joint manipulation, Cryotherapy, and Moist heat  PLAN FOR NEXT SESSION: Start bike and stairs: quad strengthening; gait and balance training, weekly ROM measurements, modalities  PRN   Darleene Cleaver, PTA 02/13/2023, 10:04 AM   Date of referral: 12/25/22 Referring provider: Jodi Geralds, MD Referring diagnosis? Z96.659 (ICD-10-CM) - S/P TKR (total knee replacement)  Treatment diagnosis? (if different than referring diagnosis) M25.562, M25.662, M62.81, R60.0, R26.2

## 2023-02-16 ENCOUNTER — Ambulatory Visit: Payer: Medicare Other

## 2023-02-16 DIAGNOSIS — R262 Difficulty in walking, not elsewhere classified: Secondary | ICD-10-CM

## 2023-02-16 DIAGNOSIS — M25662 Stiffness of left knee, not elsewhere classified: Secondary | ICD-10-CM

## 2023-02-16 DIAGNOSIS — M25562 Pain in left knee: Secondary | ICD-10-CM

## 2023-02-16 DIAGNOSIS — R600 Localized edema: Secondary | ICD-10-CM

## 2023-02-16 DIAGNOSIS — M6281 Muscle weakness (generalized): Secondary | ICD-10-CM

## 2023-02-16 NOTE — Progress Notes (Signed)
Carelink Summary Report / Loop Recorder 

## 2023-02-16 NOTE — Therapy (Signed)
OUTPATIENT PHYSICAL THERAPY TREATMENT   Patient Name: Tiffany Byrd MRN: [id number] DOB:1941/02/04, 82 y.o., female Today's Date: 02/16/2023  END OF SESSION:  PT End of Session - 02/16/23 1101     Visit Number 8    Date for PT Re-Evaluation 03/11/23    Authorization Type [employer]    Progress Note Due on Visit 10    PT Start Time 1018    PT Stop Time 1100    PT Time Calculation (min) 42 min    Activity Tolerance Patient tolerated treatment well    Behavior During Therapy WFL for tasks assessed/performed                 Past Medical History:  Diagnosis Date   GERD (gastroesophageal reflux disease)    Hyperlipidemia    Stroke (HCC)    Thyroid disease    Past Surgical History:  Procedure Laterality Date   ABDOMINAL HYSTERECTOMY     CHOLECYSTECTOMY     LUMBAR LAMINECTOMY/DECOMPRESSION MICRODISCECTOMY     REPLACEMENT TOTAL KNEE     Patient Active Problem List   Diagnosis Date Noted   Gait difficulty 02/03/2022   Metabolic bone disease 11/05/2021   Benign hypertension with CKD (chronic kidney disease) stage III (HCC) 07/29/2021   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 07/29/2021   Acute stroke due to ischemia (HCC) 07/14/2021   Mixed hyperlipidemia 07/14/2021   Essential hypertension 07/14/2021   GERD without esophagitis 07/14/2021   Facet arthropathy, lumbosacral 07/03/2021   Lumbar radiculopathy 07/03/2021   Chronic kidney disease, stage 3a (HCC) 04/30/2021   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   Closed fracture of left tibial plateau 04/09/2020   History of total right knee replacement 03/05/2020   Primary osteoarthritis of right hip 03/05/2020   Primary osteoarthritis of left knee 03/05/2020   PVD (posterior vitreous detachment), both eyes 08/04/2019   Acute renal insufficiency 06/22/2019   Greater trochanteric bursitis of left hip 10/06/2018   Episodic tension-type headache, not intractable  03/17/2018   Status post lumbar spine operative procedure for decompression of spinal cord 11/19/2016   Sensorineural hearing loss, bilateral 05/15/2015   Hallux valgus with bunions of right foot 04/04/2015   Hypothyroidism 04/04/2015   Other chondrocalcinosis, unspecified site 10/20/2014   Pseudogout of left knee 07/06/2014   Primary localized osteoarthrosis of ankle and foot 04/11/2008    PCP: Raynelle Jan., MD   REFERRING PROVIDER: Jodi Geralds, MD  REFERRING DIAG: [id number] (ICD-10-CM) - S/P TKR (total knee replacement)   THERAPY DIAG:  Acute pain of left knee  Stiffness of left knee, not elsewhere classified  Muscle weakness (generalized)  Localized edema  Difficulty in walking, not elsewhere classified  Rationale for Evaluation and Treatment: Rehabilitation  ONSET DATE: s/p L TKR 01/14/23  SUBJECTIVE:   SUBJECTIVE STATEMENT: Pt reports practicing with cane at home, feels safe with it. The medial L knee still bothers her but not as bad since she has been more consistent with pain medication.  NEXT MD VISIT: February 2024 with Dr. Luiz Blare  PERTINENT HISTORY: R TKA; R THA, OA - R hip, L knee, ankle and foot; L hip greater trochanteric bursitis; CVA ; lumbar lami/decompression/microdiscectomy;  metabolic bone disease; HTN; hypothyroidism; hearing loss, recently widowed  PAIN:  Are you having pain? Yes: NPRS scale: 8/10 Pain location: L medial knee Pain description: tight Aggravating factors: bending knee Relieving factors: ice & heat  PRECAUTIONS: None  RED FLAGS: None   WEIGHT BEARING RESTRICTIONS:  No  FALLS:  Has patient fallen in last 6 months? No  LIVING ENVIRONMENT: Lives with: lives with their son Lives in: House/apartment Stairs: No Has following equipment at home: Single point cane, Environmental consultant - 2 wheeled, and Environmental consultant - 4 wheeled  OCCUPATION: retiredd  PLOF: Independent  PATIENT GOALS: get out and walk and play in some dirt!  Also would like to  travel    OBJECTIVE:  Note: Objective measures were completed at Evaluation unless otherwise noted.  DIAGNOSTIC FINDINGS:   PATIENT SURVEYS:  LEFS 26/80  COGNITION: Overall cognitive status: Within functional limits for tasks assessed     SENSATION: Numbness L anterior shin  EDEMA:  Circumferential: 48.5 cm R knee, 52.5 cm L knee  MUSCLE LENGTH: NT  POSTURE: No Significant postural limitations  PALPATION: Incision covered, no tenderness in L calf.   LOWER EXTREMITY ROM:  Passive ROM Right eval Left eval Left 02/02/23 Left 02/09/23 Left 02/16/23  Knee flexion 115 90 96 104 99  Knee extension 0 0   0 supported    (Blank rows = not tested)  LOWER EXTREMITY MMT:  MMT Right eval Left eval  Hip flexion 4+ 2+  Hip extension    Hip abduction    Hip adduction    Knee flexion 5 3  Knee extension 4+ 3  Ankle dorsiflexion 5 5  Ankle plantarflexion     (Blank rows = not tested)  FUNCTIONAL TESTS:  5 times sit to stand: 20 seconds without UE assist  GAIT: Distance walked: 50' Assistive device utilized: Environmental consultant - 2 wheeled Level of assistance: Modified independence Comments: visually slow gait speed, reports intermittent L knee buckling, without AD unsteady, antalgic, HHA needed for short distance from chair to exam table.    TODAY'S TREATMENT:                                                                                                                              DATE:  02/16/23 Therapeutic Exercise: to improve strength and mobility.  Demo, verbal and tactile cues throughout for technique. Bike partial revolutions x 5 min Standing L HS curls 2x10 Standing marching 2x10  Standing toe raise back to counter 2x10- tactile cues to avoid genu recurvatum  Gait Training: with SPC x 300'; with SBA for safety - cues for increased knee and hip flexion   Manual Therapy: to decrease muscle spasm and pain and improve mobility IASTM s/s tools to L quads and hip  adductors g 02/13/23 Therapeutic Exercise: to improve strength and mobility.  Demo, verbal and tactile cues throughout for technique. Bike partial revolutions x 5 min Knee flexion 10# x 10 BLE; x 10 B con/L ecc Knee extension 5# x 10 BLE; x 10 B con/L ecc  Manual Therapy: to decrease muscle spasm and pain and improve mobility STM to R pes anserine, distal hip adductors, VMO scar mobilization  L manual hip adductor stretch in supine  Gait Training: with SPC  x 250' with SBA for safety  02/11/23 Therapeutic Exercise: to improve strength and mobility.  Demo, verbal and tactile cues throughout for technique. Nustep L6 x 7 min UE/LE Knee extension 10# bil x 10, x 10 with bil concentric, LLE eccentric, 5# LLE only x 10 Knee flexion 10# bil x 10, x 10 with bil concentric, LLE eccentric, 5# LLE only x 10 Prone knee bends x 10  Gait: with SPC x 250' with SBA for safety, cues for heel strike. Can adjusted for height, placed in R hand, demo proper form.  Manual Therapy: to decrease muscle spasm and pain and improve mobility PROM - in prone - poorly tolerated, reported medial knee pain IASTM to quads, rectus femoris and VMO, STM to MCL Cross friction massage to scar Modalities: MHP to L knee x 10 min (end of session, not included in treatment time)   02/09/23 Therapeutic Exercise: to improve strength and mobility.  Demo, verbal and tactile cues throughout for technique. Nustep L5 x 6 min UE/LE Standing L TKE back to counter with towel 2x10 - 5 sec hold Retro step with arm raise 1HA 2x10 Mini squats at counter 20x10; 3 seconds Seated heel slides AAROM with RLE  PROM to L knee into end range flexion  Measured knee AROM  02/04/23 Therapeutic Exercise: to improve strength and mobility.  Demo, verbal and tactile cues throughout for technique. Nustep L5 x 6 min UE/LE Standing L lunge/knee flexion stretch 10x3" Seated L heel slides AROM x 10; AAROM with RLE 2x10 Retro step with arm raise 1HA  2x10 Standing L TKE with towel behind thigh at counter 10x5" Manual Therapy: to decrease muscle spasm and pain and improve mobility Gentle L knee PROM end range flexion hanging in hip and knee flexion 02/02/23 Therapeutic Exercise: to improve strength and mobility.  Demo, verbal and tactile cues throughout for technique. Nustep L5 x 6 min UE/LE Gait x 300' with 2WRW, cues for heel strike Standing heel raises x 20  Standing HS curl x 10  Standing hip extension 2 x 10 R/L Standing hip abduction 2 x 10 R/L Supine heel slides x 10  Manual Therapy: to decrease muscle spasm and pain and improve mobility Manual edema resorption techniques to LLE to decrease edema and pain, from thigh to ankle.  Modalities: MHP to R quad in supine (leg straight) x 10 min to decrease pain at end of session (not included in treatment time).     PATIENT EDUCATION:  Education details: HEP review Person educated: Patient Education method: Programmer, multimedia, Facilities manager, Verbal cues, and Handouts Education comprehension: verbalized understanding and returned demonstration  HOME EXERCISE PROGRAM: Access Code: [email or web address] URL: [email or web address] Date: 02/04/2023 Prepared by: Verta Ellen  Exercises - Supine Quad Set  - 3 x daily - 7 x weekly - 1 sets - 10 reps - 10 hold - Supine Heel Slide with Strap  - 3 x daily - 7 x weekly - 1 sets - 10 reps - 10 hold - Supine Active Straight Leg Raise  - 3 x daily - 7 x weekly - 1 sets - 10 reps - Supine Hip Abduction  - 3 x daily - 7 x weekly - 1 sets - 10 reps - Seated Knee Flexion Extension AROM   - 3 x daily - 7 x weekly - 1 sets - 10 reps - Seated Long Arc Quad  - 3 x daily - 7 x weekly - 1 sets - 10 reps - Seated Quad Set  - 3  x daily - 7 x weekly - 1 sets - 10 reps - 5-10 sec hold - Standing Hip Abduction with Counter Support  - 1 x daily - 7 x weekly - 3 sets - 10 reps - Standing Hip Extension with Counter Support  - 1 x daily - 7 x weekly - 3 sets - 10  reps - Standing March with Counter Support  - 1 x daily - 7 x weekly - 3 sets - 10 reps - Heel Raises with Counter Support  - 1 x daily - 7 x weekly - 3 sets - 10 reps - Backward Weight Shift and Opposite Arm Raise with Walker  - 1 x daily - 7 x weekly - 2 sets - 10 reps - Terminal Knee Extension with Ball and Counter  - 1 x daily - 7 x weekly - 2 sets - 10 reps  ASSESSMENT:  CLINICAL IMPRESSION: Focused interventions on improving hip/knee flexion with gait as well progressing quad strengthening. She had many areas of restriction in her L quads and adductors. Pt continues to report increased pain in the medial knee and tightness in her knee. Knee flexion limited today most likely due to increased symptoms mentioned above.Kamerin Vantiem continues to demonstrate potential for improvement and would benefit from continued skilled therapy to address impairments.        OBJECTIVE IMPAIRMENTS: Abnormal gait, decreased activity tolerance, decreased balance, decreased endurance, decreased mobility, difficulty walking, decreased ROM, decreased strength, increased edema, increased fascial restrictions, impaired sensation, and pain.   ACTIVITY LIMITATIONS: carrying, lifting, bending, sitting, standing, squatting, sleeping, stairs, transfers, bed mobility, and locomotion level  PARTICIPATION LIMITATIONS: meal prep, cleaning, laundry, driving, shopping, community activity, and yard work  PERSONAL FACTORS: Age, Transportation, and 3+ comorbidities: R TKA; R THA, OA - R hip, L knee, ankle and foot; L hip greater trochanteric bursitis; CVA ; lumbar lami/decompression/microdiscectomy;  metabolic bone disease; HTN; hypothyroidism; hearing loss,  are also affecting patient's functional outcome.   REHAB POTENTIAL: Excellent  CLINICAL DECISION MAKING: Stable/uncomplicated  EVALUATION COMPLEXITY: Low   GOALS: Goals reviewed with patient? Yes   SHORT TERM GOALS: Target date: 02/11/2023    Independent with  initial HEP. Baseline: given Goal status: MET 02/02/23  LONG TERM GOALS: Target date: 03/11/2023   Independent with advanced/ongoing HEP to improve outcomes and carryover.  Baseline:  Goal status: IN PROGRESS  2.  Jolynda Helms will demonstrate L knee flexion to 115 deg to ascend/descend stairs. Baseline: R knee 115, L knee 90 Goal status: IN PROGRESS- 02/09/23- 104 deg  3.  Denae Smallwood will be able to ambulate 600' safely without AD and normal gait pattern to access community.  Baseline: using 2WRW Goal status: IN PROGRESS 02/11/23 - 250' with SPC, SBA for safety.   4.  Der Fei will be able to ascend/descend stairs with 1 HR and reciprocal step pattern safely to access home and community.  Baseline: unable Goal status: IN PROGRESS  5.  Malie Paszkowski will demonstrate > 19/24 on DGI to demonstrate decreased risk of falls.   Baseline: unable Goal status: IN PROGRESS   6.  Keighan Cooperwood will demonstrate improved functional LE strength by completing 5x STS in <14 seconds.  (MCID 5 seconds) Baseline: 20 seconds Goal status: IN PROGRESS  7.  Kamaiya Hug will demonstrate > 40/80 on LEFS to demonstrate improved mobility.  Baseline: 26/80 Goal status: IN PROGRESS  PLAN:  PT FREQUENCY: 2-3x/week  PT DURATION: 6 weeks  PLANNED INTERVENTIONS: 54098- PT Re-evaluation, 97110-Therapeutic  exercises, 97530- Therapeutic activity, [id number]- Neuromuscular re-education, [phone]- Self Care, 30865- Manual therapy, [phone]- Gait training, [phone]- Orthotic Fit/training, 97014- Electrical stimulation (unattended), [phone]- Electrical stimulation (manual), 97016- Vasopneumatic device, Balance training, Stair training, Taping, Dry Needling, Joint mobilization, Joint manipulation, Cryotherapy, and Moist heat  PLAN FOR NEXT SESSION: Start bike and stairs: quad strengthening; gait and balance training, weekly ROM measurements, modalities  PRN   Darleene Cleaver, PTA 02/16/2023, 11:01 AM   Date of referral:  12/25/22 Referring provider: Jodi Geralds, MD Referring diagnosis? Z96.659 (ICD-10-CM) - S/P TKR (total knee replacement)  Treatment diagnosis? (if different than referring diagnosis) M25.562, M25.662, M62.81, R60.0, R26.2

## 2023-02-19 ENCOUNTER — Encounter: Payer: Medicare Other | Admitting: Physical Therapy

## 2023-02-23 ENCOUNTER — Ambulatory Visit: Payer: Medicare Other | Admitting: Physical Therapy

## 2023-02-23 ENCOUNTER — Ambulatory Visit (INDEPENDENT_AMBULATORY_CARE_PROVIDER_SITE_OTHER): Payer: Medicare Other

## 2023-02-23 DIAGNOSIS — I639 Cerebral infarction, unspecified: Secondary | ICD-10-CM | POA: Diagnosis not present

## 2023-02-24 LAB — CUP PACEART REMOTE DEVICE CHECK
Date Time Interrogation Session: 20241227231126
Implantable Pulse Generator Implant Date: 20231211

## 2023-03-02 ENCOUNTER — Encounter: Payer: Self-pay | Admitting: Physical Therapy

## 2023-03-02 ENCOUNTER — Ambulatory Visit: Payer: Medicare Other | Attending: Orthopedic Surgery | Admitting: Physical Therapy

## 2023-03-02 DIAGNOSIS — M25662 Stiffness of left knee, not elsewhere classified: Secondary | ICD-10-CM

## 2023-03-02 DIAGNOSIS — R600 Localized edema: Secondary | ICD-10-CM | POA: Diagnosis present

## 2023-03-02 DIAGNOSIS — R262 Difficulty in walking, not elsewhere classified: Secondary | ICD-10-CM | POA: Diagnosis present

## 2023-03-02 DIAGNOSIS — M6281 Muscle weakness (generalized): Secondary | ICD-10-CM

## 2023-03-02 DIAGNOSIS — M25562 Pain in left knee: Secondary | ICD-10-CM

## 2023-03-02 NOTE — Therapy (Addendum)
 OUTPATIENT PHYSICAL THERAPY TREATMENT/Discharge Summary Progress Note Reporting Period 01/28/2023 to 03/02/2023  See note below for Objective Data and Assessment of Progress/Goals.      Patient Name: Tiffany Byrd MRN: [id number] DOB:October 07, 1940, 83 y.o., female Today's Date: 03/02/2023  END OF SESSION:  PT End of Session - 03/02/23 1019     Visit Number 9    Date for PT Re-Evaluation 03/11/23    Authorization Type [employer]    Progress Note Due on Visit 10    PT Start Time 1016    PT Stop Time 1116    PT Time Calculation (min) 60 min    Activity Tolerance Patient tolerated treatment well    Behavior During Therapy WFL for tasks assessed/performed                 Past Medical History:  Diagnosis Date   GERD (gastroesophageal reflux disease)    Hyperlipidemia    Stroke (HCC)    Thyroid disease    Past Surgical History:  Procedure Laterality Date   ABDOMINAL HYSTERECTOMY     CHOLECYSTECTOMY     LUMBAR LAMINECTOMY/DECOMPRESSION MICRODISCECTOMY     REPLACEMENT TOTAL KNEE     Patient Active Problem List   Diagnosis Date Noted   Gait difficulty 02/03/2022   Metabolic bone disease 11/05/2021   Benign hypertension with CKD (chronic kidney disease) stage III (HCC) 07/29/2021   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 07/29/2021   Acute stroke due to ischemia (HCC) 07/14/2021   Mixed hyperlipidemia 07/14/2021   Essential hypertension 07/14/2021   GERD without esophagitis 07/14/2021   Facet arthropathy, lumbosacral 07/03/2021   Lumbar radiculopathy 07/03/2021   Chronic kidney disease, stage 3a (HCC) 04/30/2021   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   Closed fracture of left tibial plateau 04/09/2020   History of total right knee replacement 03/05/2020   Primary osteoarthritis of right hip 03/05/2020   Primary osteoarthritis of left knee 03/05/2020   PVD (posterior vitreous detachment), both eyes 08/04/2019    Acute renal insufficiency 06/22/2019   Greater trochanteric bursitis of left hip 10/06/2018   Episodic tension-type headache, not intractable 03/17/2018   Status post lumbar spine operative procedure for decompression of spinal cord 11/19/2016   Sensorineural hearing loss, bilateral 05/15/2015   Hallux valgus with bunions of right foot 04/04/2015   Hypothyroidism 04/04/2015   Other chondrocalcinosis, unspecified site 10/20/2014   Pseudogout of left knee 07/06/2014   Primary localized osteoarthrosis of ankle and foot 04/11/2008    PCP: Ryan Powell HERO., MD   REFERRING PROVIDER: Yvone Rush, MD  REFERRING DIAG: [phone] (ICD-10-CM) - S/P TKR (total knee replacement)   THERAPY DIAG:  Acute pain of left knee  Stiffness of left knee, not elsewhere classified  Muscle weakness (generalized)  Localized edema  Difficulty in walking, not elsewhere classified  Rationale for Evaluation and Treatment: Rehabilitation  ONSET DATE: s/p L TKR 01/14/23  SUBJECTIVE:   SUBJECTIVE STATEMENT: She went in to the Dr. Dayla Wednesday because of all the pain in the back of her L knee, they checked her and the prosthesis is good, gave her a gel to rub on her knee and that has helped the pain significantly and sleeping better now.   Walked in with her cane today, feeling better with using it.   NEXT MD VISIT: February 2024 with Dr. Yvone  PERTINENT HISTORY: R TKA; R THA, OA - R hip, L knee, ankle and foot; L hip greater trochanteric bursitis; CVA ;  lumbar lami/decompression/microdiscectomy;  metabolic bone disease; HTN; hypothyroidism; hearing loss, recently widowed  PAIN:  Are you having pain? Yes: NPRS scale: 6/10 Pain location: L medial knee Pain description: tight Aggravating factors: bending knee Relieving factors: ice & heat  PRECAUTIONS: None  RED FLAGS: None   WEIGHT BEARING RESTRICTIONS: No  FALLS:  Has patient fallen in last 6 months? No  LIVING ENVIRONMENT: Lives with:  lives with their son Lives in: House/apartment Stairs: No Has following equipment at home: Single point cane, Environmental Consultant - 2 wheeled, and Environmental Consultant - 4 wheeled  OCCUPATION: retired  PLOF: Independent  PATIENT GOALS: get out and walk and play in some dirt!  Also would like to travel    OBJECTIVE:  Note: Objective measures were completed at Evaluation unless otherwise noted.  DIAGNOSTIC FINDINGS:   PATIENT SURVEYS:  LEFS 26/80  COGNITION: Overall cognitive status: Within functional limits for tasks assessed     SENSATION: Numbness L anterior shin  EDEMA:  Circumferential: 48.5 cm R knee, 52.5 cm L knee  MUSCLE LENGTH: NT  POSTURE: No Significant postural limitations  PALPATION: Incision covered, no tenderness in L calf.   LOWER EXTREMITY ROM:  Passive ROM Right eval Left eval Left 02/02/23 Left 02/09/23 Left 02/16/23  Knee flexion 115 90 96 104 99  Knee extension 0 0   0 supported    (Blank rows = not tested)  LOWER EXTREMITY MMT:  MMT Right eval Left eval  Hip flexion 4+ 2+  Hip extension    Hip abduction    Hip adduction    Knee flexion 5 3  Knee extension 4+ 3  Ankle dorsiflexion 5 5  Ankle plantarflexion     (Blank rows = not tested)  FUNCTIONAL TESTS:  5 times sit to stand: 20 seconds without UE assist  GAIT: Distance walked: 50' Assistive device utilized: Environmental Consultant - 2 wheeled Level of assistance: Modified independence Comments: visually slow gait speed, reports intermittent L knee buckling, without AD unsteady, antalgic, HHA needed for short distance from chair to exam table.    [facility] PT Assessment - 03/02/23 0001       Standardized Balance Assessment   Standardized Balance Assessment Dynamic Gait Index      Dynamic Gait Index   Level Surface Mild Impairment    Change in Gait Speed Mild Impairment    Gait with Horizontal Head Turns Mild Impairment    Gait with Vertical Head Turns Mild Impairment    Gait and Pivot Turn Mild Impairment     Step Over Obstacle Moderate Impairment    Step Around Obstacles Moderate Impairment    Steps Moderate Impairment    Total Score 13             TODAY'S TREATMENT:                                                                                                                              DATE:  03/02/2023 Therapeutic Exercise: to improve strength and mobility.  Demo, verbal and tactile cues throughout for technique. Bike seat 4 starting partial revolutions progressing to retro progressing to full revolutions.  X 8 min total  Step ups (1 riser) 2 x 10  Step downs - aerobic step to aerex pad x 10 - 1 UE support, aerobic step to floor x 7 - 2 UE support.  Review and progression of HEP [employer] pe[employer] SLS  Therapeutic Activity:  assessment of progress ROM  DGI 5x STS  Self Care: Education on fall risk and safety, need for continued strengthening, recommendations for gym based exercise program   02/16/23 Therapeutic Exercise: to improve strength and mobility.  Demo, verbal and tactile cues throughout for technique. Bike partial revolutions x 5 min Standing L HS curls 2x10 Standing marching 2x10  Standing toe raise back to counter 2x10- tactile cues to avoid genu recurvatum  Gait Training: with SPC x 300'; with SBA for safety - cues for increased knee and hip flexion   Manual Therapy: to decrease muscle spasm and pain and improve mobility IASTM s/s tools to L quads and hip adductors G  02/13/23 Therapeutic Exercise: to improve strength and mobility.  Demo, verbal and tactile cues throughout for technique. Bike partial revolutions x 5 min Knee flexion 10# x 10 BLE; x 10 B con/L ecc Knee extension 5# x 10 BLE; x 10 B con/L ecc  Manual Therapy: to decrease muscle spasm and pain and improve mobility STM to R pes anserine, distal hip adductors, VMO scar mobilization  L manual hip adductor stretch in supine  Gait Training: with SPC x 250' with SBA for safety  02/11/23 Therapeutic  Exercise: to improve strength and mobility.  Demo, verbal and tactile cues throughout for technique. Nustep L6 x 7 min UE/LE Knee extension 10# bil x 10, x 10 with bil concentric, LLE eccentric, 5# LLE only x 10 Knee flexion 10# bil x 10, x 10 with bil concentric, LLE eccentric, 5# LLE only x 10 Prone knee bends x 10  Gait: with SPC x 250' with SBA for safety, cues for heel strike. Can adjusted for height, placed in R hand, demo proper form.  Manual Therapy: to decrease muscle spasm and pain and improve mobility PROM - in prone - poorly tolerated, reported medial knee pain IASTM to quads, rectus femoris and VMO, STM to MCL Cross friction massage to scar Modalities: MHP to L knee x 10 min (end of session, not included in treatment time)       PATIENT EDUCATION:  Education details: HEP update Person educated: Patient Education method: Explanation, Demonstration, Verbal cues, and Handouts Education comprehension: verbalized understanding and returned demonstration  HOME EXERCISE PROGRAM: Access Code: [email or web address] URL: [email or web address] Date: 03/02/2023 Prepared by: Almarie Sprinkles  Exercises - Supine Active Straight Leg Raise  - 3 x daily - 7 x weekly - 1 sets - 10 reps - Standing Hip Abduction with Counter Support  - 1 x daily - 7 x weekly - 3 sets - 10 reps - Standing Hip Extension with Counter Support  - 1 x daily - 7 x weekly - 3 sets - 10 reps - Standing March with Counter Support  - 1 x daily - 7 x weekly - 3 sets - 10 reps - Heel Raises with Counter Support  - 1 x daily - 7 x weekly - 3 sets - 10 reps - Terminal Knee Extension with Ball and Counter  - 1 x daily - 7  x weekly - 2 sets - 10 reps - [employer]  - 1 x daily - 7 x weekly - 3 sets - 10 reps - Sit to Stand  - 3 x daily - 7 x weekly - 3 sets - 10 reps - Standing Single Leg Stance with Counter Support  - 1 x daily - 7 x weekly - 1 sets - 3 reps - 30 sec hold - Hamstring Curl with Weight Machine  - 1 x  daily - 7 x weekly - 3 sets - 10 reps - Single Leg Knee Extension with Weight Machine  - 1 x daily - 7 x weekly - 3 sets - 10 reps - Full Leg Press  - 1 x daily - 7 x weekly - 3 sets - 10 reps  ASSESSMENT:  CLINICAL IMPRESSION: Tiffany Byrd reports significant improvement in R knee pain.  She still demonstrates weakness in R knee, challenged today with step ups and down on aerobic step.  On the DGI she scored 13/24 placing her at increased risk of falls.  She does feel that she is at a point she is comfortable continuing rehab on her own with her HEP and declined to schedule further visits today, although this PT did recommend continuing with physical therapy since we have not met all of her goals and due to her fall risk.  We discussed her fall risk and advised continuing to use [facility] for all community ambulation, as she was very unsteady without her cane.  Progressed her HEP adding exercises for balance as well as strengthening, also encouraged to return to her silver sneakers program at the [facility] fitness [facility], she would benefit with working with the trainers to set up a strengthening program there in addition to her HEP.   She has met STG #2 for ROM and demonstrates 0-115 R knee AROM.  Placing [employer] on 30 day hold today by request.     OBJECTIVE IMPAIRMENTS: Abnormal gait, decreased activity tolerance, decreased balance, decreased endurance, decreased mobility, difficulty walking, decreased ROM, decreased strength, increased edema, increased fascial restrictions, impaired sensation, and pain.   ACTIVITY LIMITATIONS: carrying, lifting, bending, sitting, standing, squatting, sleeping, stairs, transfers, bed mobility, and locomotion level  PARTICIPATION LIMITATIONS: meal prep, cleaning, laundry, driving, shopping, community activity, and yard work  PERSONAL FACTORS: Age, Transportation, and 3+ comorbidities: R TKA; R THA, OA - R hip, L knee, ankle and foot; L hip greater trochanteric  bursitis; CVA ; lumbar lami/decompression/microdiscectomy;  metabolic bone disease; HTN; hypothyroidism; hearing loss,  are also affecting patient's functional outcome.   REHAB POTENTIAL: Excellent  CLINICAL DECISION MAKING: Stable/uncomplicated  EVALUATION COMPLEXITY: Low   GOALS: Goals reviewed with patient? Yes   SHORT TERM GOALS: Target date: 02/11/2023    Independent with initial HEP. Baseline: given Goal status: MET 02/02/23  LONG TERM GOALS: Target date: 03/11/2023   Independent with advanced/ongoing HEP to improve outcomes and carryover.  Baseline:  Goal status: MET 03/02/2023   2.  Tiffany Byrd will demonstrate L knee flexion to 115 deg to ascend/descend stairs. Baseline: R knee 115, L knee 90 Goal status: MET- 02/09/23- 104 deg 03/02/23- 115 deg in sitting  3.  Tiffany Byrd will be able to ambulate 600' safely without AD and normal gait pattern to access community.  Baseline: using 2WRW Goal status: IN PROGRESS 02/11/23 - 250' with SPC, SBA for safety.  03/02/23- 300' with SPC, 47' without AD (unsteady)  4.  Tiffany Byrd will be able to ascend/descend  stairs with 1 HR and reciprocal step pattern safely to access home and community.  Baseline: unable Goal status: IN PROGRESS  03/02/23- step to gait with HR, limited steps.   5.  Tiffany Byrd will demonstrate > 19/24 on DGI to demonstrate decreased risk of falls.   Baseline: unable Goal status: IN PROGRESS  03/02/23- 13/24 with AD  6.  Tiffany Byrd will demonstrate improved functional LE strength by completing 5x STS in <14 seconds.  (MCID 5 seconds) Baseline: 20 seconds Goal status: IN PROGRESS 03/02/2023 - 17.5 seconds  7.  Tiffany Byrd will demonstrate > 40/80 on LEFS to demonstrate improved mobility.  Baseline: 26/80 Goal status: IN PROGRESS  PLAN:  PT FREQUENCY: 2-3x/week  PT DURATION: 6 weeks  PLANNED INTERVENTIONS: 97164- PT Re-evaluation, 97110-Therapeutic exercises, 97530- Therapeutic activity, 97112-  Neuromuscular re-education, 97535- Self Care, 02859- Manual therapy, [phone]- Gait training, [phone]- Orthotic Fit/training, 97014- Electrical stimulation (unattended), [phone]- Electrical stimulation (manual), 97016- Vasopneumatic device, Balance training, Stair training, Taping, Dry Needling, Joint mobilization, Joint manipulation, Cryotherapy, and Moist heat  PLAN FOR NEXT SESSION: 30 day hold   Almarie JINNY Sprinkles, PT, DPT  03/02/2023, 11:32 AM   PHYSICAL THERAPY DISCHARGE SUMMARY  Visits from Start of Care: 9  Current functional level related to goals / functional outcomes: See above   Remaining deficits: R knee weakness, high risk of falls   Education / Equipment: HEP  Plan: Patient agrees to discharge.  Patient goals were not met. Refer to above clinical impression and goal assessment for status as of last visit on 03/02/2023. Patient was placed on hold for 30 days and has not needed to return to PT, therefore will proceed with discharge from PT for this episode.      Almarie JINNY Sprinkles, PT  04/16/2023 4:32 PM

## 2023-03-30 ENCOUNTER — Ambulatory Visit (INDEPENDENT_AMBULATORY_CARE_PROVIDER_SITE_OTHER): Payer: Medicare Other

## 2023-03-30 DIAGNOSIS — I639 Cerebral infarction, unspecified: Secondary | ICD-10-CM | POA: Diagnosis not present

## 2023-03-30 LAB — CUP PACEART REMOTE DEVICE CHECK
Date Time Interrogation Session: 20250131230725
Implantable Pulse Generator Implant Date: 20231211

## 2023-05-04 ENCOUNTER — Ambulatory Visit (INDEPENDENT_AMBULATORY_CARE_PROVIDER_SITE_OTHER): Payer: Medicare Other

## 2023-05-04 DIAGNOSIS — I639 Cerebral infarction, unspecified: Secondary | ICD-10-CM

## 2023-05-04 LAB — CUP PACEART REMOTE DEVICE CHECK
Date Time Interrogation Session: 20250309231625
Implantable Pulse Generator Implant Date: 20231211

## 2023-05-06 NOTE — Progress Notes (Signed)
 [facility] Summary Report / Loop Recorder

## 2023-06-08 ENCOUNTER — Ambulatory Visit (INDEPENDENT_AMBULATORY_CARE_PROVIDER_SITE_OTHER): Payer: Medicare Other

## 2023-06-08 DIAGNOSIS — I639 Cerebral infarction, unspecified: Secondary | ICD-10-CM | POA: Diagnosis not present

## 2023-06-08 NOTE — Progress Notes (Signed)
 [facility] Summary Report / Loop Recorder

## 2023-06-09 LAB — CUP PACEART REMOTE DEVICE CHECK
Date Time Interrogation Session: 20250413231812
Implantable Pulse Generator Implant Date: 20231211

## 2023-07-13 ENCOUNTER — Ambulatory Visit (INDEPENDENT_AMBULATORY_CARE_PROVIDER_SITE_OTHER): Payer: Medicare Other

## 2023-07-13 DIAGNOSIS — I639 Cerebral infarction, unspecified: Secondary | ICD-10-CM | POA: Diagnosis not present

## 2023-07-13 LAB — CUP PACEART REMOTE DEVICE CHECK
Date Time Interrogation Session: 20250518232648
Implantable Pulse Generator Implant Date: 20231211

## 2023-07-15 ENCOUNTER — Ambulatory Visit: Payer: Self-pay | Admitting: Cardiology

## 2023-07-28 NOTE — Progress Notes (Signed)
 [facility] Summary Report / Loop Recorder

## 2023-07-28 NOTE — Addendum Note (Signed)
 Addended by: Edra Govern D on: 07/28/2023 09:54 AM   Modules accepted: Orders

## 2023-08-13 ENCOUNTER — Ambulatory Visit: Payer: Self-pay | Admitting: Cardiology

## 2023-08-13 ENCOUNTER — Ambulatory Visit (INDEPENDENT_AMBULATORY_CARE_PROVIDER_SITE_OTHER)

## 2023-08-13 DIAGNOSIS — I639 Cerebral infarction, unspecified: Secondary | ICD-10-CM

## 2023-08-13 LAB — CUP PACEART REMOTE DEVICE CHECK
Date Time Interrogation Session: 20250618232324
Implantable Pulse Generator Implant Date: 20231211

## 2023-09-01 NOTE — Progress Notes (Signed)
 [facility] Summary Report / Loop Recorder

## 2023-09-10 ENCOUNTER — Encounter (HOSPITAL_COMMUNITY): Payer: Self-pay

## 2023-09-10 ENCOUNTER — Ambulatory Visit (HOSPITAL_COMMUNITY)
Admission: EM | Admit: 2023-09-10 | Discharge: 2023-09-10 | Disposition: A | Discharge status: Home / Self Care | Attending: Family Medicine | Admitting: Family Medicine

## 2023-09-10 DIAGNOSIS — M792 Neuralgia and neuritis, unspecified: Secondary | ICD-10-CM | POA: Diagnosis not present

## 2023-09-10 DIAGNOSIS — R7301 Impaired fasting glucose: Secondary | ICD-10-CM

## 2023-09-10 LAB — POCT FASTING CBG KUC MANUAL ENTRY: POCT Glucose (KUC): 141 mg/dL — AB (ref 70–99)

## 2023-09-10 MED ORDER — TRAMADOL HCL 50 MG PO TABS: 50.0000 mg | ORAL_TABLET | Freq: Four times a day (QID) | ORAL | 0 refills | Status: AC | PRN

## 2023-09-10 NOTE — Discharge Instructions (Addendum)
 Results for orders placed or performed during the hospital encounter of 09/10/23  POC CBG monitoring   Collection Time: 09/10/23 11:01 AM  Result Value Ref Range   POCT Glucose  141 (A) 70 - 99 mg/dL   You have had labs (blood work to further address your elevated fasting blood sugar today) sent today. We will call you with any significant abnormalities or if there is need to begin or change treatment or pursue further follow up.  You may also review your test results online through MyChart. If you do not have a MyChart account, instructions to sign up should be on your discharge paperwork.

## 2023-09-10 NOTE — ED Triage Notes (Signed)
 Patient presenting with left side pain and numbness in the arm and leg onset one month ago . Patient does have a history of a stroke 2 years ago affecting the left hand. Also had a Knee replacement with a nerve block on the left side this past November.  Prescriptions or OTC medications tried: Yes- Pregablin 75 mg, tylenol     with mild relief.

## 2023-09-10 NOTE — ED Provider Notes (Signed)
 [facility]   [phone] 09/10/23 Arrival Time: 0936  ASSESSMENT & PLAN:  1. Neuropathic pain of forearm, left   2. Neuropathic pain of lower extremity, left   3. Elevated fasting blood sugar     Follow-up Information     Spry, Powell HERO., MD.   Specialty: Family Medicine Why: Keep your follow up appointment next month. Contact information: [address] [place] [address] [phone]                Discussed elevated fasting CBG: Results for orders placed or performed during the hospital encounter of 09/10/23  POC CBG monitoring   Collection Time: 09/10/23 11:01 AM  Result Value Ref Range   POCT Glucose ([facility]) 141 (A) 70 - 99 mg/dL   OTC symptom care as needed.  Trial of prn: Meds ordered this encounter  Medications   traMADol  (ULTRAM ) 50 MG tablet    Sig: Take 1 tablet (50 mg total) by mouth every 6 (six) hours as needed.    Dispense:  20 tablet    Refill:  0   Labs Pending:  HEMOGLOBIN A1C  BASIC METABOLIC PANEL WITH GFR     Reviewed expectations re: course of current medical issues. Questions answered. Outlined signs and symptoms indicating need for more acute intervention. Understanding verbalized. After Visit Summary given.   SUBJECTIVE: History from: Patient. Tiffany Byrd is a 83 y.o. female. Patient presenting with left side pain and numbness in the arm and leg onset one month ago . Patient does have a history of a stroke 2 years ago affecting the left hand. Also had a L knee replacement with a nerve block on the left side this past November.  Prescriptions or OTC medications tried: Yes- Pregablin 75 mg, tylenol   with mild relief.   OBJECTIVE:  Vitals:   09/10/23 1018  BP: 113/74  Pulse: (!) 57  Resp: 18  Temp: 98.1 F (36.7 C)  TempSrc: Oral  SpO2: 94%  Weight: 73 kg  Height: 5' (1.524 m)    General appearance: alert; no distress Extremities: no edema; some baseline weakness of LUE secondary to prev stroke; no  bony TTP of L extremities Skin: warm and dry Neurologic: normal gait; decreased sensation of LUE; normal grip Psychological: alert and cooperative; normal mood and affect  Labs: Results for orders placed or performed during the hospital encounter of 09/10/23  POC CBG monitoring   Collection Time: 09/10/23 11:01 AM  Result Value Ref Range   POCT Glucose ([facility]) 141 (A) 70 - 99 mg/dL   Labs Reviewed  POCT FASTING CBG [facility] MANUAL ENTRY - Abnormal; Notable for the following components:      Result Value   POCT Glucose ([facility]) 141 (*)    All other components within normal limits  HEMOGLOBIN A1C  BASIC METABOLIC PANEL WITH GFR    Imaging: No results found.  Allergies  Allergen Reactions   Latex    Penicillins    Hydrocodone -Acetaminophen  Anxiety    Past Medical History:  Diagnosis Date   GERD (gastroesophageal reflux disease)    Hyperlipidemia    Stroke [facility])    Thyroid disease    Social History   Socioeconomic History   Marital status: Widowed    Spouse name: Not on file   Number of children: Not on file   Years of education: Not on file   Highest education level: Not on file  Occupational History   Not on file  Tobacco Use   Smoking  status: Never   Smokeless tobacco: Never  Vaping Use   Vaping status: Never Used  Substance and Sexual Activity   Alcohol use: Not Currently   Drug use: Never   Sexual activity: Not Currently  Other Topics Concern   Not on file  Social History Narrative   Not on file   Social Drivers of Health   Financial Resource Strain: Not on file  Food Insecurity: Not on file  Transportation Needs: Not on file  Physical Activity: Not on file  Stress: Not on file  Social Connections: Not on file  Intimate Partner Violence: Not on file   Family History  Problem Relation Age of Onset   Heart disease Neg Hx    Past Surgical History:  Procedure Laterality Date   ABDOMINAL HYSTERECTOMY     CHOLECYSTECTOMY     LUMBAR  LAMINECTOMY/DECOMPRESSION MICRODISCECTOMY     REPLACEMENT TOTAL KNEE       Rolinda Rogue, MD 09/10/23 [phone]

## 2023-09-10 NOTE — ED Notes (Signed)
 Blood draw attempted by nurse, this author informed unable to collect sample after 3 attempts.

## 2023-09-10 NOTE — ED Notes (Signed)
 Dr. Rolinda notified that blood was not obtained due to poor venous access.

## 2023-09-14 ENCOUNTER — Ambulatory Visit

## 2023-09-14 DIAGNOSIS — I639 Cerebral infarction, unspecified: Secondary | ICD-10-CM | POA: Diagnosis not present

## 2023-09-15 LAB — CUP PACEART REMOTE DEVICE CHECK
Date Time Interrogation Session: 20250720233408
Implantable Pulse Generator Implant Date: 20231211

## 2023-09-22 ENCOUNTER — Ambulatory Visit: Payer: Self-pay | Admitting: Cardiology

## 2023-10-14 NOTE — Progress Notes (Signed)
 [facility] Summary Report / Loop Recorder

## 2023-10-15 ENCOUNTER — Ambulatory Visit: Payer: Self-pay | Admitting: Cardiology

## 2023-10-15 ENCOUNTER — Ambulatory Visit (INDEPENDENT_AMBULATORY_CARE_PROVIDER_SITE_OTHER)

## 2023-10-15 DIAGNOSIS — I639 Cerebral infarction, unspecified: Secondary | ICD-10-CM | POA: Diagnosis not present

## 2023-10-15 LAB — CUP PACEART REMOTE DEVICE CHECK
Date Time Interrogation Session: 20250820233843
Implantable Pulse Generator Implant Date: 20231211

## 2023-11-16 ENCOUNTER — Ambulatory Visit (INDEPENDENT_AMBULATORY_CARE_PROVIDER_SITE_OTHER)

## 2023-11-16 DIAGNOSIS — I639 Cerebral infarction, unspecified: Secondary | ICD-10-CM

## 2023-11-16 LAB — CUP PACEART REMOTE DEVICE CHECK
Date Time Interrogation Session: 20250921232344
Implantable Pulse Generator Implant Date: 20231211

## 2023-11-17 ENCOUNTER — Ambulatory Visit: Payer: Self-pay | Admitting: Cardiology

## 2023-11-17 NOTE — Progress Notes (Signed)
 Remote Loop Recorder Transmission

## 2023-11-30 NOTE — Progress Notes (Signed)
 Remote Loop Recorder Transmission

## 2023-12-16 ENCOUNTER — Encounter

## 2023-12-17 ENCOUNTER — Encounter

## 2023-12-17 ENCOUNTER — Ambulatory Visit

## 2023-12-17 DIAGNOSIS — I639 Cerebral infarction, unspecified: Secondary | ICD-10-CM

## 2023-12-17 LAB — CUP PACEART REMOTE DEVICE CHECK
Date Time Interrogation Session: 20251022232018
Implantable Pulse Generator Implant Date: 20231211

## 2023-12-19 NOTE — Progress Notes (Signed)
 Remote Loop Recorder Transmission

## 2023-12-21 ENCOUNTER — Ambulatory Visit: Payer: Self-pay | Admitting: Cardiology

## 2024-01-16 ENCOUNTER — Encounter

## 2024-01-17 ENCOUNTER — Ambulatory Visit

## 2024-01-18 ENCOUNTER — Encounter

## 2024-01-20 ENCOUNTER — Ambulatory Visit: Attending: Cardiology

## 2024-01-20 DIAGNOSIS — I639 Cerebral infarction, unspecified: Secondary | ICD-10-CM

## 2024-01-22 LAB — CUP PACEART REMOTE DEVICE CHECK
Date Time Interrogation Session: 20251125232157
Implantable Pulse Generator Implant Date: 20231211

## 2024-01-25 NOTE — Progress Notes (Signed)
 Remote Loop Recorder Transmission

## 2024-01-28 ENCOUNTER — Ambulatory Visit: Payer: Self-pay | Admitting: Cardiology

## 2024-02-16 ENCOUNTER — Encounter

## 2024-02-20 ENCOUNTER — Ambulatory Visit: Attending: Cardiology

## 2024-02-20 DIAGNOSIS — I639 Cerebral infarction, unspecified: Secondary | ICD-10-CM

## 2024-02-22 LAB — CUP PACEART REMOTE DEVICE CHECK
Date Time Interrogation Session: 20251226231529
Implantable Pulse Generator Implant Date: 20231211

## 2024-02-23 ENCOUNTER — Ambulatory Visit: Payer: Self-pay | Admitting: Cardiology

## 2024-02-29 NOTE — Progress Notes (Signed)
 Remote Loop Recorder Transmission

## 2024-03-18 ENCOUNTER — Encounter

## 2024-03-22 ENCOUNTER — Ambulatory Visit: Attending: Cardiology

## 2024-03-22 DIAGNOSIS — I639 Cerebral infarction, unspecified: Secondary | ICD-10-CM

## 2024-03-22 LAB — CUP PACEART REMOTE DEVICE CHECK
Date Time Interrogation Session: 20260126232538
Implantable Pulse Generator Implant Date: 20231211

## 2024-03-24 ENCOUNTER — Ambulatory Visit: Payer: Self-pay | Admitting: Cardiology

## 2024-03-31 NOTE — Progress Notes (Signed)
 Remote Loop Recorder Transmission

## 2024-04-08 IMAGING — MR MR HEAD W/O CM
12 of 17 series · 34 of 48 positions shown · non-contrast
Comparison: CTA head and neck 2201 hours. [REDACTED] Brain MRI 09/29/2020.

CLINICAL DATA: 80-year-old female with neurologic deficit, left
upper extremity heaviness.

EXAM:
MRI HEAD WITHOUT CONTRAST
TECHNIQUE: Multiplanar, multiecho pulse sequences of the brain and surrounding
structures were obtained without intravenous contrast.

[Series 5: DWI · axial · 3.0mm · 0.88mm/px · z∈[-84,+48]mm · 5 of 96 slices shown (1 of 4)]
[im 1/96]
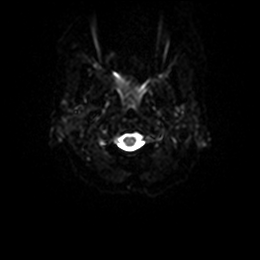
[im 24/96]
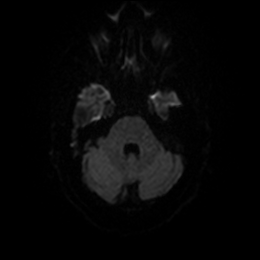
[im 48/96]
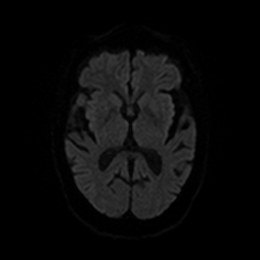
[im 72/96]
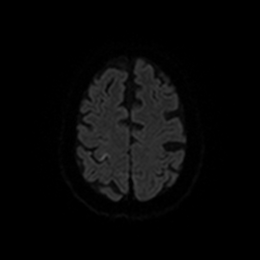
[im 96/96]
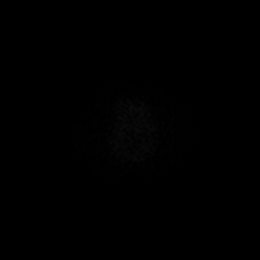

[Series 6: DWI · axial · 3.0mm · 0.88mm/px · z∈[-84,+48]mm · 2 of 48 slices shown (2 of 4)]
[im 1/48]
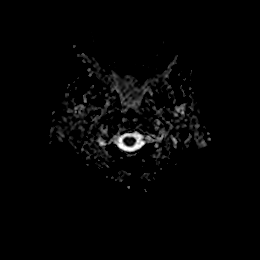
[im 48/48]
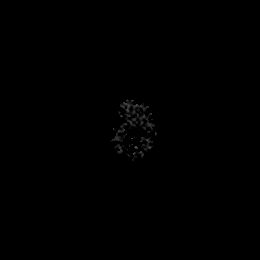

[Series 7: DWI · coronal · 4.0mm · 0.88mm/px · 4 of 64 slices shown (3 of 4)]
[im 1/64]
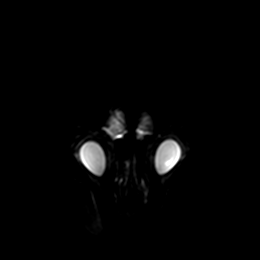
[im 22/64]
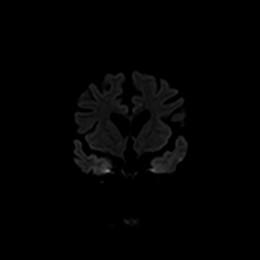
[im 43/64]
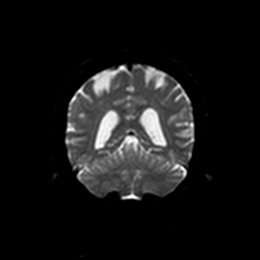
[im 64/64]
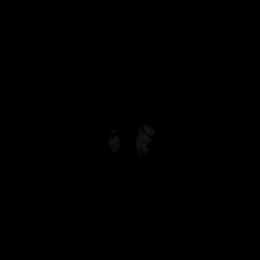

[Series 8: DWI · coronal · 4.0mm · 0.88mm/px · 2 of 32 slices shown (4 of 4)]
[im 1/32]
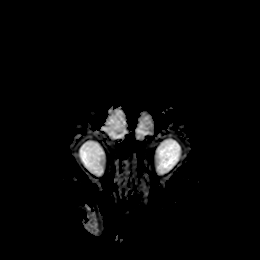
[im 32/32]
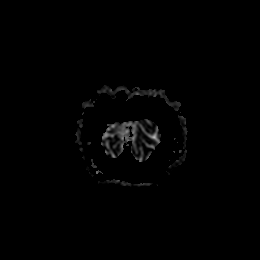

[Series 9: T1 · sagittal · 5.0mm · 0.75mm/px · 1 of 23 slices shown]
[im 1/23]
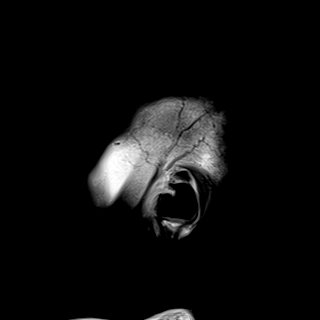

[Series 10: T2 · axial · 5.0mm · 0.72mm/px · z∈[-85,+50]mm · 2 of 25 slices shown (1 of 2)]
[im 1/25]
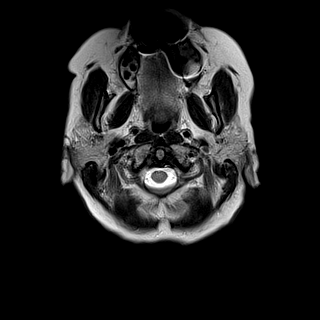
[im 25/25]
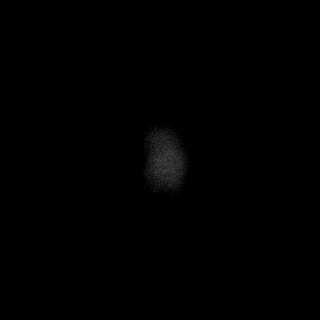

[Series 11: FLAIR · axial · 5.0mm · 0.45mm/px · z∈[-86,+49]mm · 2 of 25 slices shown]
[im 1/25]
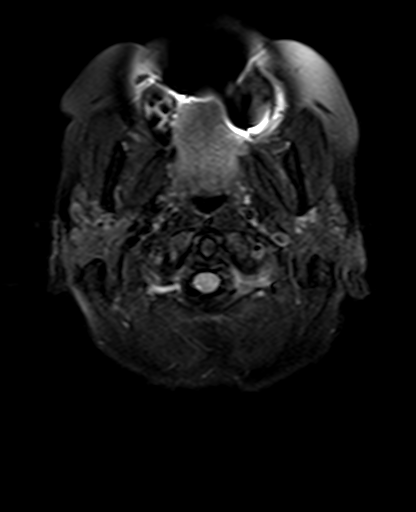
[im 25/25]
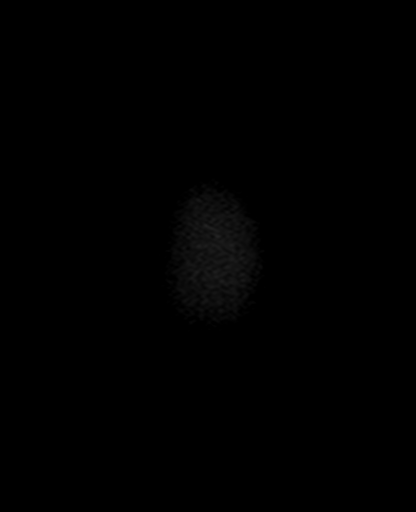

[Series 12: mag_images · axial · 3.0mm · 0.90mm/px · z∈[-101,+65]mm · 4 of 60 slices shown]
[im 1/60]
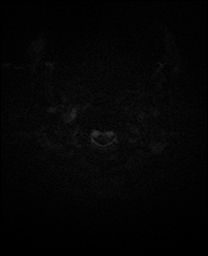
[im 20/60]
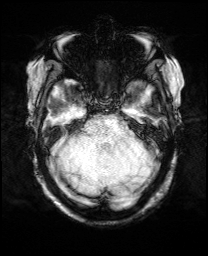
[im 40/60]
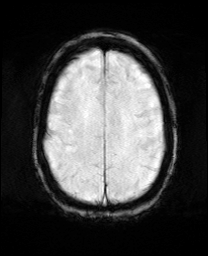
[im 60/60]
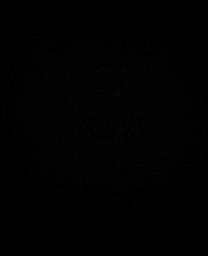

[Series 13: pha_images · axial · 3.0mm · 0.90mm/px · z∈[-99,+59]mm · 3 of 55 slices shown]
[im 1/55]
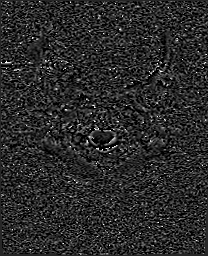
[im 28/55]
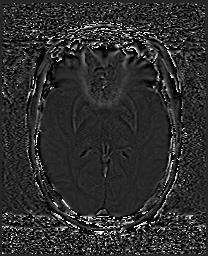
[im 55/55]
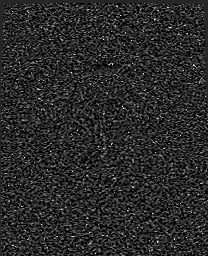

[Series 14: swi_images · axial · 3.0mm · 0.90mm/px · z∈[-101,+65]mm · 4 of 60 slices shown]
[im 1/60]
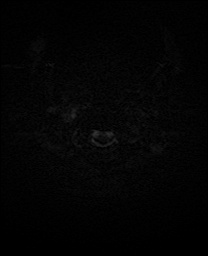
[im 20/60]
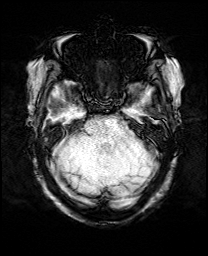
[im 40/60]
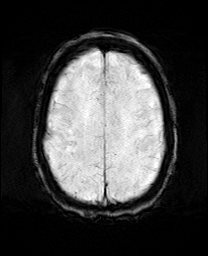
[im 60/60]
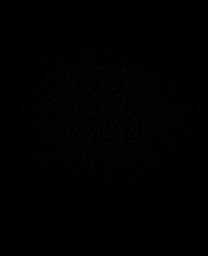

[Series 15: mip_images(sw) · axial · 24.0mm · 0.90mm/px · z∈[-91,+55]mm · 3 of 53 slices shown]
[im 1/53]
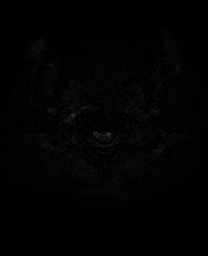
[im 27/53]
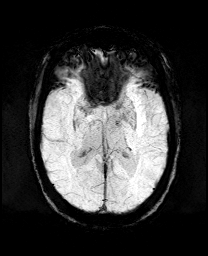
[im 53/53]
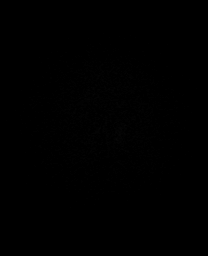

[Series 17: T2 · coronal · 5.0mm · 0.34mm/px · 2 of 29 slices shown (2 of 2)]
[im 1/29]
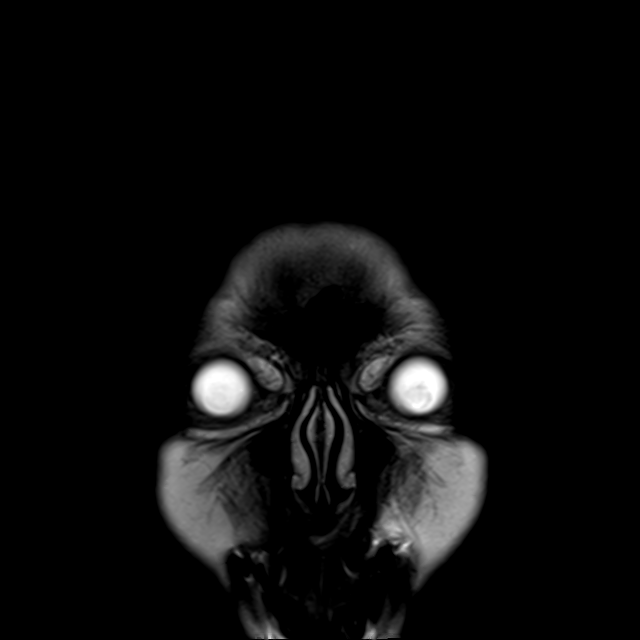
[im 29/29]
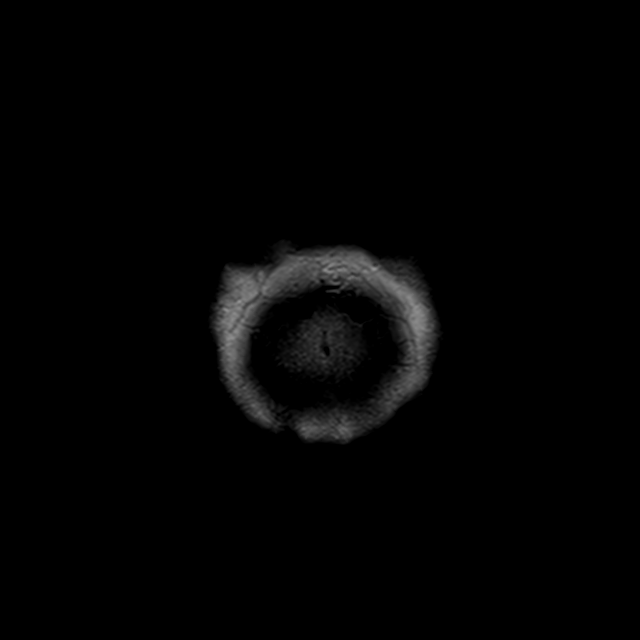

[34 of 48 positions shown; findings below may reference images not displayed]

FINDINGS: Brain: Small cortical and subcortical white matter restricted
diffusion at the superior right motor strip, left upper extremity
representation area (series 5 images 81 through 84. Minimal if any
associated T2 and FLAIR hyperintensity.

No other restricted diffusion. No midline shift, mass effect,
evidence of mass lesion, ventriculomegaly, extra-axial collection or
acute intracranial hemorrhage. Cervicomedullary junction and
pituitary are within normal limits.

Outside of the superior perirolandic cortex gray and white matter
signal is stable since last year and within normal limits for age.
No chronic cerebral blood products identified.

Vascular: Major intracranial vascular flow voids are stable since
last year.

Skull and upper cervical spine: Visualized bone marrow signal is
within normal limits. Partially visible cervical spine degeneration.

Sinuses/Orbits: Stable, negative.

Other: Mastoids remain clear. Visible internal auditory structures
appear normal. Negative visible scalp and face.
IMPRESSION: 1. Positive for small acute cortical and subcortical white matter
infarcts at the superior right motor strip, left upper extremity
representation area. No associated hemorrhage or mass effect.

2. Elsewhere stable from last year and normal for age noncontrast
MRI appearance of the brain.

## 2024-04-18 ENCOUNTER — Encounter

## 2024-04-22 ENCOUNTER — Ambulatory Visit

## 2024-05-23 ENCOUNTER — Ambulatory Visit

## 2024-06-23 ENCOUNTER — Ambulatory Visit

## 2024-07-24 ENCOUNTER — Ambulatory Visit

## 2024-08-24 ENCOUNTER — Ambulatory Visit

## 2024-09-24 ENCOUNTER — Ambulatory Visit

## 2024-10-25 ENCOUNTER — Ambulatory Visit

## 2024-11-25 ENCOUNTER — Ambulatory Visit

## 2024-12-26 ENCOUNTER — Ambulatory Visit

## 2025-01-26 ENCOUNTER — Ambulatory Visit

## 2025-02-26 ENCOUNTER — Ambulatory Visit
